# Patient Record
Sex: Female | Born: 1950 | Race: White | Hispanic: No | Smoking: Never smoker
Health system: Southern US, Community
[De-identification: ages and names within clinical notes are randomized; demographics above are authoritative.]

## PROBLEM LIST (undated history)

## (undated) DIAGNOSIS — R5383 Other fatigue: Secondary | ICD-10-CM

## (undated) DIAGNOSIS — E039 Hypothyroidism, unspecified: Secondary | ICD-10-CM

## (undated) DIAGNOSIS — M1990 Unspecified osteoarthritis, unspecified site: Secondary | ICD-10-CM

## (undated) DIAGNOSIS — R079 Chest pain, unspecified: Secondary | ICD-10-CM

## (undated) DIAGNOSIS — E079 Disorder of thyroid, unspecified: Secondary | ICD-10-CM

## (undated) DIAGNOSIS — M25511 Pain in right shoulder: Secondary | ICD-10-CM

## (undated) DIAGNOSIS — Z634 Disappearance and death of family member: Secondary | ICD-10-CM

## (undated) DIAGNOSIS — W57XXXA Bitten or stung by nonvenomous insect and other nonvenomous arthropods, initial encounter: Secondary | ICD-10-CM

## (undated) DIAGNOSIS — K219 Gastro-esophageal reflux disease without esophagitis: Secondary | ICD-10-CM

## (undated) DIAGNOSIS — R9389 Abnormal findings on diagnostic imaging of other specified body structures: Secondary | ICD-10-CM

## (undated) DIAGNOSIS — T63791A Toxic effect of contact with other venomous plant, accidental (unintentional), initial encounter: Secondary | ICD-10-CM

## (undated) DIAGNOSIS — Z8619 Personal history of other infectious and parasitic diseases: Secondary | ICD-10-CM

## (undated) HISTORY — DX: Unspecified osteoarthritis, unspecified site: M19.90

## (undated) HISTORY — DX: Disorder of thyroid, unspecified: E07.9

## (undated) HISTORY — DX: Other fatigue: R53.83

## (undated) HISTORY — DX: Personal history of other infectious and parasitic diseases: Z86.19

## (undated) HISTORY — PX: BACK SURGERY: SHX140

## (undated) HISTORY — DX: Disappearance and death of family member: Z63.4

## (undated) HISTORY — PX: THYROID SURGERY: SHX805

## (undated) HISTORY — DX: Toxic effect of contact with other venomous plant, accidental (unintentional), initial encounter: T63.791A

## (undated) HISTORY — DX: Chest pain, unspecified: R07.9

## (undated) HISTORY — DX: Bitten or stung by nonvenomous insect and other nonvenomous arthropods, initial encounter: W57.XXXA

## (undated) HISTORY — DX: Pain in right shoulder: M25.511

## (undated) HISTORY — DX: Gastro-esophageal reflux disease without esophagitis: K21.9

## (undated) HISTORY — DX: Abnormal findings on diagnostic imaging of other specified body structures: R93.89

## (undated) HISTORY — PX: NECK SURGERY: SHX720

---

## 1999-12-30 ENCOUNTER — Ambulatory Visit (HOSPITAL_COMMUNITY): Admission: RE | Admit: 1999-12-30 | Discharge: 1999-12-30 | Payer: Self-pay | Admitting: Neurosurgery

## 2000-02-26 ENCOUNTER — Inpatient Hospital Stay (HOSPITAL_COMMUNITY): Admission: RE | Admit: 2000-02-26 | Discharge: 2000-02-27 | Payer: Self-pay | Admitting: Neurosurgery

## 2000-03-19 ENCOUNTER — Encounter: Admission: RE | Admit: 2000-03-19 | Discharge: 2000-03-19 | Payer: Self-pay | Admitting: Neurosurgery

## 2000-06-11 ENCOUNTER — Encounter: Admission: RE | Admit: 2000-06-11 | Discharge: 2000-06-11 | Payer: Self-pay | Admitting: Neurosurgery

## 2000-06-20 ENCOUNTER — Ambulatory Visit (HOSPITAL_COMMUNITY): Admission: RE | Admit: 2000-06-20 | Discharge: 2000-06-20 | Payer: Self-pay | Admitting: Neurosurgery

## 2000-07-05 ENCOUNTER — Ambulatory Visit (HOSPITAL_COMMUNITY): Admission: RE | Admit: 2000-07-05 | Discharge: 2000-07-05 | Payer: Self-pay | Admitting: Neurosurgery

## 2000-09-14 ENCOUNTER — Encounter: Admission: RE | Admit: 2000-09-14 | Discharge: 2000-09-14 | Payer: Self-pay | Admitting: Neurosurgery

## 2000-09-28 ENCOUNTER — Ambulatory Visit (HOSPITAL_COMMUNITY): Admission: RE | Admit: 2000-09-28 | Discharge: 2000-09-28 | Payer: Self-pay | Admitting: Neurosurgery

## 2000-11-16 ENCOUNTER — Encounter: Admission: RE | Admit: 2000-11-16 | Discharge: 2000-11-16 | Payer: Self-pay | Admitting: Neurosurgery

## 2002-06-18 ENCOUNTER — Ambulatory Visit (HOSPITAL_COMMUNITY): Admission: RE | Admit: 2002-06-18 | Discharge: 2002-06-18 | Payer: Self-pay | Admitting: Neurosurgery

## 2004-01-18 ENCOUNTER — Ambulatory Visit (HOSPITAL_COMMUNITY): Admission: RE | Admit: 2004-01-18 | Discharge: 2004-01-18 | Payer: Self-pay | Admitting: Family Medicine

## 2004-01-21 LAB — HM DEXA SCAN

## 2004-08-05 ENCOUNTER — Inpatient Hospital Stay (HOSPITAL_COMMUNITY): Admission: EM | Admit: 2004-08-05 | Discharge: 2004-08-08 | Payer: Self-pay | Admitting: Emergency Medicine

## 2004-08-06 ENCOUNTER — Encounter: Payer: Self-pay | Admitting: Cardiovascular Disease

## 2006-02-02 ENCOUNTER — Ambulatory Visit (HOSPITAL_COMMUNITY): Admission: RE | Admit: 2006-02-02 | Discharge: 2006-02-02 | Payer: Self-pay | Admitting: Family Medicine

## 2006-08-07 ENCOUNTER — Inpatient Hospital Stay (HOSPITAL_COMMUNITY): Admission: EM | Admit: 2006-08-07 | Discharge: 2006-08-10 | Payer: Self-pay | Admitting: Emergency Medicine

## 2006-09-08 ENCOUNTER — Ambulatory Visit (HOSPITAL_COMMUNITY): Admission: RE | Admit: 2006-09-08 | Discharge: 2006-09-08 | Payer: Self-pay | Admitting: Family Medicine

## 2009-06-03 ENCOUNTER — Ambulatory Visit (HOSPITAL_COMMUNITY): Admission: RE | Admit: 2009-06-03 | Discharge: 2009-06-03 | Payer: Self-pay | Admitting: Family Medicine

## 2009-10-15 ENCOUNTER — Ambulatory Visit (HOSPITAL_COMMUNITY): Admission: RE | Admit: 2009-10-15 | Discharge: 2009-10-15 | Payer: Self-pay | Admitting: Family Medicine

## 2009-12-14 ENCOUNTER — Emergency Department (HOSPITAL_COMMUNITY): Admission: EM | Admit: 2009-12-14 | Discharge: 2009-12-14 | Payer: Self-pay | Admitting: Emergency Medicine

## 2011-05-08 NOTE — Discharge Summary (Signed)
NAME:  Tami Estrada, Tami Estrada                           ACCOUNT NO.:  [id number]   MEDICAL RECORD NO.:  [id number]                   PATIENT TYPE:  INP   LOCATION:  3703                                 FACILITY:  [facility]   PHYSICIAN:  Nanetta Batty, M.D.                DATE OF BIRTH:  1951/11/20   DATE OF ADMISSION:  08/06/2004  DATE OF DISCHARGE:  08/08/2004                                 DISCHARGE SUMMARY   DISCHARGE DIAGNOSES:  1. Chest pain consistent with unstable angina, catheterization, this     admission, revealing normal coronaries and normal left ventricular     function.  2. Treated hypothyroidism, TSH was low, this admission, and her Synthroid     will be held.   HOSPITAL COURSE:  The patient is a 60 year old female followed by Dr. Patrica Duel, who is a remote smoker.  She presented with substernal chest  pressure consistent with unstable angina.  She did have associated shortness  of breath and mild diaphoresis.  Her chest pain then became constant.  She  was seen at [facility] ER.  Enzymes were negative.  She was transferred to  [facility] for further evaluation.  Her EKG at [facility] was slightly abnormal  with some ST depression in the inferior leads and some poor anterior R wave  progression.  Patient was put on heparin, nitrates, aspirin.  Her TSH was  noted to be low at 0.02 and her Synthroid was held.  CT scan of her chest  showed no evidence of pulmonary embolism.  Catheterization was done, August 07, 2004, which revealed normal coronaries and normal LV function.  She  tolerated this well.  She was seen by Dr. Griffith Citron for GI  evaluation.  He felt her symptoms were more chest wall pain and not GI; he  suggested a PPI for a month and nonsteroidal.  No further GI workup was  recommended.  We feel she can be discharged, August 08, 2004.  She will  follow up with Dr. Patrica Duel in [place].   LABORATORY DATA:  A followup TSH was done prior to discharge which  confirmed  a low TSH of 0.016 with a T4 of 10.1 and T3 of 139.6.  Other labs at  discharge:  White count 6.3, hemoglobin 13.2, hematocrit 38.2, platelets  185,000.  Sodium 138, potassium 3.6, BUN 9, creatinine 1.0.  Liver functions  are normal.  CK-MBs and troponin are negative.  Lipid profile showed a  cholesterol of 139, HDL 50, LDL 70.   DISCHARGE MEDICATIONS:  1. Synthroid will be held.  2. She will go home on Voltaren 50 mg b.i.d. for 1 week to 2 weeks.  3. Protonix 40 mg a day for 1 month.      Abelino Derrick, P.A.  Nanetta Batty, M.D.    Lenard Lance  D:  08/08/2004  T:  08/09/2004  Job:  [id number]   cc:   Patrica Duel, M.D.  [address]  [place]  [place] [address]  Fax: [phone]

## 2011-05-08 NOTE — Cardiovascular Report (Signed)
NAME:  Tami Estrada, Tami Estrada                           ACCOUNT NO.:  [id number]   MEDICAL RECORD NO.:  [id number]                   PATIENT TYPE:  INP   LOCATION:  3703                                 FACILITY:  [facility]   PHYSICIAN:  Cristy Hilts. Jacinto Halim, M.D.                  DATE OF BIRTH:  [birth year]   DATE OF PROCEDURE:  08/07/2004  DATE OF DISCHARGE:  08/08/2004                              CARDIAC CATHETERIZATION   PROCEDURE PERFORMED:  1. Left ventriculography.  2. Selective right and left coronary arteriography.   INDICATION:  [name] is a 60 year old Caucasian female with no  significant prior cardiac history who was admitted to the hospital with  chest pain.  She has a very strong family history of premature coronary  artery disease.  Given this, she was ruled out for myocardial infarction and  she was brought to the cardiac catheterization lab after discussing the  risks and benefits of cardiac catheterization versus alternatives that  include cardiac stress testing.  Per patient permission, she was brought to  the cardiac catheterization lab to evaluate her coronary anatomy for  definitive diagnosis of coronary artery disease.   HEMODYNAMIC DATA:  1. The left ventricular pressure was 126/1 with end-diastolic pressure of 7     mmHg.  2. Aortic pressure 139/64 with a mean of 94 mmHg.  3. There was no pressure gradient across the aortic valve.   ANGIOGRAPHIC DATA:  Left ventricle:  Left ventricular systolic function was  normal and ejection fraction was estimated at 60%.  There is no significant  mitral regurgitation.   Right coronary artery:  The right coronary artery has a superior takeoff.  It was cannulated by means of a 6 [place] Noto catheter.  The RCA is normal.   Left main coronary artery:  Left main coronary artery is a long segment  vessel and a large vessel which is normal.   Circumflex:  Circumflex is a large caliber vessel.  Continues in the AV  groove as two  moderate branches and gives origin to large obtuse marginal  one.  The circumflex is normal.   Left anterior descending artery:  Left anterior descending artery is a large  caliber vessel.  It gives origin to a large septal perforator and a large  diagonal one which is normal.   IMPRESSION:  1. Normal left ventricular systolic function, ejection fraction 60%.  2. Normal coronary arteries.   RECOMMENDATIONS:  Evaluation for noncardiac cause of chest pain is  indicated.   TECHNIQUE OF PROCEDURE:  Under usual sterile precautions, using a 6 French  right femoral arterial access, a 6 [place] multipurpose B2 catheter was  advanced into the ascending aorta over a 0.035-inch J wire.  The catheter  was then gently advanced to the left ventricle and left ventricular  pressures were monitored.  Hand contrast injection of the left ventricle was  performed  both in the LAO and RAO projection.  The catheter was pulled back  in the ascending aorta and pressure gradient across the aortic valve was  monitored. Ascending aortogram was performed because of inability to  cannulate the right coronary artery.  The superior takeoff of the right  coronary artery was then visualized.  Left main coronary selective engaged  with the help of the multipurpose B2 catheter and angiography was repeated.  Then, the catheter was pulled out of the body and initially an AL3 catheter  was utilized and then a Noto catheter was utilized to engage the right  coronary artery and angiography was repeated.  Then, the catheter was pulled  out of the body in the usual fashion.  The patient tolerated the procedure  well.  No immediate complications noted.   ASCENDING AORTOGRAM:  Ascending aortogram revealed presence of three aortic  valve cusps.  There was no evidence of aortic regurgitation.  The superior  origin of the right coronary artery was confirmed from the ascending  aortogram.                                                Cristy Hilts. Jacinto Halim, M.D.    Pilar Plate  D:  08/07/2004  T:  08/08/2004  Job:  [id number]

## 2011-05-08 NOTE — H&P (Signed)
NAMESHELBEE, APGAR                 ACCOUNT NO.:  [id number]   MEDICAL RECORD NO.:  [id number]          PATIENT TYPE:  INP   LOCATION:  A303                          FACILITY:  [facility]   PHYSICIAN:  Kirk Ruths, M.D.DATE OF BIRTH:  [birth year]   DATE OF ADMISSION:  08/07/2006  DATE OF DISCHARGE:  LH                                HISTORY & PHYSICAL   CHIEF COMPLAINT:  Fever, nausea, and headache.   HISTORY OF PRESENT ILLNESS:  This is a 60 year old female who began having  chills the day before admission. The patient also stated she felt a headache  and had an episode of vomiting the night before. The patient felt better the  next morning but chills, vomiting, and headache recurred. The patient  presented to the emergency room where she was found to have a normal white  count. She had multiple cells in her urine. She was febrile, although I do  not have that temperature available at this moment. The patient denies neck  pain or soreness. She does state she has had some recent urinary tract  infection and some mild dysuria. She is admitted for treatment of her  urinary tract infection and treatment of her other symptoms with further  workup.   ALLERGIES:  She cannot tolerate HYDROCODONE due to her nausea and vomiting.   PAST MEDICAL HISTORY:  She has hypothyroidism and takes Synthroid 100 mcg  daily.   REVIEW OF SYSTEMS:  Denies diarrhea. Denies cough or congestion. Does state  that she had a tic bite approximately 1 and 1/2 months ago.   SOCIAL HISTORY:  Non-smoker, non-drinker.   PHYSICAL EXAMINATION:  GENERAL:  A middle age female who appears miserable.  VITAL SIGNS:  Temperature 99.9, pulse 100 and regular. Respiratory rate 20.  Blood pressure 130/65.  HEENT:  Tympanic membranes normal. Pupils are equal, round, and reactive to  light and accommodation. Oropharynx benign.  NECK:  Supple without soreness. No JVD, bruit, or thyromegaly.  LUNGS:  Clear.  HEART:  Regular  sinus rhythm without murmur, rub, or gallop.  ABDOMEN:  Soft with mild suprapubic tenderness.  EXTREMITIES:  Without clubbing, cyanosis, or edema.  NEUROLOGIC:  Examination is grossly intact.   ASSESSMENT:  1. Urinary tract infection with fever, vomiting, and headache.  2. Hypothyroidism.      Kirk Ruths, M.D.  Electronically Signed     WMM/MEDQ  D:  08/08/2006  T:  08/08/2006  Job:  [id number]

## 2011-05-08 NOTE — Consult Note (Signed)
NAME:  Tami Estrada, Tami Estrada                           ACCOUNT NO.:  [id number]   MEDICAL RECORD NO.:  [id number]                   PATIENT TYPE:  INP   LOCATION:  3703                                 FACILITY:  [facility]   PHYSICIAN:  Griffith Citron, M.D.             DATE OF BIRTH:  1951-06-12   DATE OF CONSULTATION:  08/07/2004  DATE OF DISCHARGE:                                   CONSULTATION   REASON FOR CONSULTATION:  Noncardiac chest pain.   HISTORY OF PRESENT ILLNESS:  The patient is a 60 year old white female who  presented to [facility] with complaints of chest pain that began  four nights ago.  It has been constant in nature over the past four days.  The pain is centered in the substernal xiphoid region, radiates underneath  the left breast.  The pain is sharp in nature and is exacerbated with deep  breathing or with change of position.  She has had an associated cough  without shortness of breath.  No hemoptysis.  The patient underwent cardiac  catheterization to rule out coronary artery disease, especially with a  family history of coronary artery disease.  That examination, performed  earlier today, was normal.   The patient offers no GI symptoms.  She has no odynophagia, dysphagia,  pyrosis, water brash, or dysgeusia.  She denies abdominal pain, bloating,  nausea, vomiting, or change in appetite which remains excellent.  She did  have one episode of nausea which occurred with extreme exacerbation of the  chest pain but this was transient.  Bowel habits have remained regular,  although a bit firmer.  There is no hematochezia, change in stool caliber,  straining, or other related symptom.   The patient does not have any constitutional symptoms.  Her appetite remains  excellent.  Weight has been stable.  No fever, rigor, chill, or diaphoresis.   PAST MEDICAL HISTORY:  Hypothyroidism.   CURRENT MEDICATIONS:  1. Synthroid daily.  2. Ibuprofen p.r.n. averaging perhaps  one per week.   ALLERGIES:  No known drug allergies.   SOCIAL HISTORY:  Patient is married with three children, two great  grandchildren.  The patient is one of 11 siblings.  One sister died.  Two  boys, nine girls in the original brood.  Heart disease predominates  throughout the family.  Father with history of peptic ulcer disease.  Two  sisters with GERD.   REVIEW OF SYSTEMS:  Noncontributory.   PHYSICAL EXAMINATION:  GENERAL:  Healthy-appearing middle aged white female  appearing a bit older than stated age.  Alert, oriented.  Full affect.  Normal mood.  VITAL SIGNS:  Stable.  HEENT:  Anicteric sclerae.  Pink conjunctivae.  EOMI.  PERRL.  No  oropharyngeal lesion without lesion of lip, tongue, or gums.  NECK:  Supple.  No adenopathy, thyromegaly.  Soft bilateral carotid bruit.  No JVD.  CHEST:  Clear  to auscultation.  No friction rub appreciated.  No  adventitious sounds.  CARDIAC:  Regular rhythm.  No gallop.  No murmur.  ABDOMEN:  Benign, soft, nontender, nondistended.  No palpable organomegaly  or mass.  Bowel sounds are active throughout.  No borborygmi, bruit, or  splash.  RECTAL:  Not performed.  EXTREMITIES:  No clubbing, cyanosis, or edema.  Right groin catheterization  site without evidence of bleeding.  NEUROLOGIC:  No focal deficit.   LABORATORIES:  Normal.   ASSESSMENT:  Chest pain, noncardiac based on normal angiogram.  No symptoms  to suggest a gastrointestinal etiology.  There is no evidence for reflux,  dyspepsia, or lower gastrointestinal symptoms.  The constant nature of the  patient's symptoms position nature and exacerbation with deep breathing and  also the characteristic of sharp, knife like quality all suggest a  serositis.  Pulmonary embolus, viral infection, vasculitis all need to be  considered.  I think endoscopy will be normal.  I am willing to perform this  if gastrointestinal illness continues to be considered a possibility;  however, I think  that evaluation with chest CT and work-up for possible  vasculitis would be far more fruitful.  In the interim I would simply treat  with antireflux measures.   RECOMMENDATIONS:  1. Chest CT.  2. Evaluate possible serositis.  3. Reserve further GI evaluation for evolving symptoms and/or findings.                                               Griffith Citron, M.D.    JRM/MEDQ  D:  08/07/2004  T:  08/08/2004  Job:  [id number]   cc:   Patrica Duel, M.D.  [address], [address]  [place]  [place] [address]  Fax: [phone]
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
## 2011-05-08 NOTE — H&P (Signed)
NAME:  Tami Estrada, Tami Estrada                           ACCOUNT NO.:  [id number]   MEDICAL RECORD NO.:  [id number]                   PATIENT TYPE:  INP   LOCATION:  A202                                 FACILITY:  [facility]   PHYSICIAN:  Patrica Duel, M.D.                 DATE OF BIRTH:  12-17-1951   DATE OF ADMISSION:  08/05/2004  DATE OF DISCHARGE:                                HISTORY & PHYSICAL   COMBINED HISTORY AND PHYSICAL AND DISCHARGE SUMMARY   CHIEF COMPLAINT:  Chest pain.   HISTORY OF PRESENT ILLNESS:  This is a 60 year old female with a history of  hypothyroidism which has been compensated.  She also has undergone a  laminectomy of the cervical spine by Dr. Lovell Sheehan in the remote past.   The patient presented to the office in the late afternoon with a 24-36 hour  history of worsening exertional substernal chest pain with radiation to the  right lateral chest.  There was some pleuritic as well as positional  component noted.  Upon further questioning, the patient relates exertional  chest pain intermittently over the past year.  Of significance, her family  history is strongly positive for coronary artery disease.  She has three  siblings that have undergone bypass grafting.  She has one brother with  valvular disease.   The patient is a nonsmoker.  Her lipid status is undetermined at this time.   An EKG obtained in the office revealed subtle changes of possible  inferolateral ischemia.  She was sent to the emergency department for  further evaluation and therapy.   Workup in the emergency department is essentially benign except for noted  EKG changes.  D-dimer was normal and laboratory unremarkable.   The patient was admitted for rule-out myocardial infarction protocol.  Further evaluation and therapy is  indicated.   PAST MEDICAL HISTORY:  As noted above.   MEDICATIONS ON ADMISSION:  Synthroid 100 mcg daily.   ALLERGIES:  None known.   PAST MEDICAL HISTORY:  As noted  above.   FAMILY HISTORY:  As noted above.   REVIEW OF SYSTEMS:  Negative except as mentioned.   SOCIAL HISTORY:  Nonsmoker, nondrinker.  A very supportive family.   PHYSICAL EXAMINATION:  GENERAL:  A very-pleasant female who is alert and  oriented and in no acute distress.  VITAL SIGNS:  Blood pressure 144/77, pulse 79, respirations 18.  She is  afebrile.  HEENT:  Normocephalic, atraumatic.  Pupils are equal.  Ears, nose and throat  are benign.  NECK:  Supple.  There are no bruits audible.  No thyromegaly or masses.  LUNGS:  Clear.  CHEST:  Pectus excavatum is present.  HEART:  Sounds are completely normal.  No murmurs, rubs or gallops.  ABDOMEN:  Nontender, nondistended.  Bowel sounds are intact.  EXTREMITIES:  No clubbing, cyanosis or edema.  NEUROLOGIC:  Exam is within normal limits.  Neurologic exam is nonfocal.   LABORATORY DATA:  Normal EKG as noted.  D-dimer is normal.  BNP is also  normal.   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  A very strong family history of coronary disease well documented.  EKG suggestive of ischemia.   PLAN:  The patient was admitted for rule out myocardial infarction protocol,  etc.   HOSPITAL COURSE:  The patient was empirically treated with heparin IV and  started on Imdur 30 mg daily.  She was also given metoprolol 25 mg b.i.d.  Overnight, the patient has had two brief episodes of chest pain.  An EKG  obtained this morning shows normalization of ST-T segments that were  previously somewhat depressed.  Currently she is stable without chest pain.  Rhythm is normal.   DISCHARGE PLANNING:  The patient is to be transferred to [facility] as soon  as a bed is available under the service of [facility].  They  are to call when an opening comes.  We will continue current therapy at this  time.     ___________________________________________                                         Patrica Duel, M.D.   MC/MEDQ  D:  08/06/2004  T:  08/06/2004  Job:  [id number]
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
## 2011-05-08 NOTE — Discharge Summary (Signed)
NAMETELETHA, Tami Estrada                 ACCOUNT NO.:  [id number]   MEDICAL RECORD NO.:  [id number]          PATIENT TYPE:  INP   LOCATION:  A303                          FACILITY:  [facility]   PHYSICIAN:  Kirk Ruths, M.D.DATE OF BIRTH:  [birth year]   DATE OF ADMISSION:  08/07/2006  DATE OF DISCHARGE:  08/21/2007LH                                 DISCHARGE SUMMARY   DISCHARGE DIAGNOSES:  1. Urinary tract infection.  2. Headache.  3. Vomiting.  4. Hypothyroidism.   HOSPITAL COURSE:  This is a 60 year old female who is in good health with  the exception of hypothyroidism controlled with Synthroid.  The patient two  days before admission began having a fever with headaches and vomiting.  In  the emergency room, she was found to have a normal CT scan of the brain.  White count was 5500, hemoglobin 13.9.  Potassium was 3.2 which improved to  4.4 two days later.  The patient's LFTs were normal.  Lipase was normal.  Her TSH was 0.018.  Urinalysis showed 11-20 white cells.  The patient was  admitted to the floor.  Headache and nausea were treated.  A culture was  obtained of her urine but had no growth at the time of discharge.  The  patient's chills and fever resolved as well as her headache.  A titer for  RMSF was sent out but is not back at the time of discharge.  The patient had  a remote history of tick bite approximately a month before.  She is feeling  fine, tolerating a regular diet, no headache, no fever.   She will be discharged home on Cipro and doxycycline.   She will be followed in the office in 1 week if needed.      Kirk Ruths, M.D.  Electronically Signed     WMM/MEDQ  D:  08/10/2006  T:  08/10/2006  Job:  [id number]

## 2011-05-18 ENCOUNTER — Emergency Department (HOSPITAL_COMMUNITY)
Admission: EM | Admit: 2011-05-18 | Discharge: 2011-05-18 | Disposition: A | Discharge status: Home / Self Care | Payer: Medicare Other | Attending: Emergency Medicine | Admitting: Emergency Medicine

## 2011-05-18 ENCOUNTER — Emergency Department (HOSPITAL_COMMUNITY): Payer: Medicare Other

## 2011-05-18 DIAGNOSIS — E039 Hypothyroidism, unspecified: Secondary | ICD-10-CM | POA: Insufficient documentation

## 2011-05-18 DIAGNOSIS — R059 Cough, unspecified: Secondary | ICD-10-CM | POA: Insufficient documentation

## 2011-05-18 DIAGNOSIS — R05 Cough: Secondary | ICD-10-CM | POA: Insufficient documentation

## 2011-05-18 DIAGNOSIS — J40 Bronchitis, not specified as acute or chronic: Secondary | ICD-10-CM | POA: Insufficient documentation

## 2011-05-18 DIAGNOSIS — R0602 Shortness of breath: Secondary | ICD-10-CM | POA: Insufficient documentation

## 2011-05-18 DIAGNOSIS — R51 Headache: Secondary | ICD-10-CM | POA: Insufficient documentation

## 2011-05-18 DIAGNOSIS — J029 Acute pharyngitis, unspecified: Secondary | ICD-10-CM | POA: Insufficient documentation

## 2012-03-11 ENCOUNTER — Ambulatory Visit (HOSPITAL_COMMUNITY)
Admission: RE | Admit: 2012-03-11 | Discharge: 2012-03-11 | Disposition: A | Discharge status: Home / Self Care | Payer: Medicare Other | Source: Ambulatory Visit | Attending: Family Medicine | Admitting: Family Medicine

## 2012-03-11 ENCOUNTER — Ambulatory Visit (INDEPENDENT_AMBULATORY_CARE_PROVIDER_SITE_OTHER): Payer: Medicare Other | Admitting: Family Medicine

## 2012-03-11 ENCOUNTER — Other Ambulatory Visit: Payer: Self-pay | Admitting: Family Medicine

## 2012-03-11 ENCOUNTER — Encounter: Payer: Self-pay | Admitting: Family Medicine

## 2012-03-11 VITALS — BP 134/80 | HR 70 | Resp 15 | Ht 62.0 in | Wt 129.1 lb

## 2012-03-11 DIAGNOSIS — Z780 Asymptomatic menopausal state: Secondary | ICD-10-CM

## 2012-03-11 DIAGNOSIS — M25562 Pain in left knee: Secondary | ICD-10-CM

## 2012-03-11 DIAGNOSIS — M25569 Pain in unspecified knee: Secondary | ICD-10-CM

## 2012-03-11 DIAGNOSIS — E039 Hypothyroidism, unspecified: Secondary | ICD-10-CM

## 2012-03-11 DIAGNOSIS — Z139 Encounter for screening, unspecified: Secondary | ICD-10-CM

## 2012-03-11 LAB — COMPREHENSIVE METABOLIC PANEL
Alkaline Phosphatase: 80 U/L (ref 39–117)
BUN: 13 mg/dL (ref 6–23)
CO2: 27 mEq/L (ref 19–32)
Calcium: 9.6 mg/dL (ref 8.4–10.5)
Chloride: 104 mEq/L (ref 96–112)
Potassium: 4.5 mEq/L (ref 3.5–5.3)
Total Bilirubin: 0.7 mg/dL (ref 0.3–1.2)
Total Protein: 7 g/dL (ref 6.0–8.3)

## 2012-03-11 MED ORDER — NAPROXEN 500 MG PO TABS: 500.0000 mg | ORAL_TABLET | Freq: Two times a day (BID) | ORAL | Status: AC

## 2012-03-11 NOTE — Patient Instructions (Signed)
Please set up for Mammogram Get the knee xrays done at [facility]  I will get records from Dr. Malvin Johns and Robbie Lis Get the labs done fasting  Pick up the anti-inflammatories I will call with lab results Please schedule a physical exam for PAP Smear

## 2012-03-12 LAB — CBC
MCHC: 32.9 g/dL (ref 30.0–36.0)
Platelets: 197 10*3/uL (ref 150–400)

## 2012-03-12 LAB — T3, FREE: T3, Free: 4 pg/mL (ref 2.3–4.2)

## 2012-03-13 ENCOUNTER — Encounter: Payer: Self-pay | Admitting: Family Medicine

## 2012-03-13 MED ORDER — LEVOTHYROXINE SODIUM 88 MCG PO TABS: 88.0000 ug | ORAL_TABLET | Freq: Every day | ORAL | Status: DC

## 2012-03-13 NOTE — Progress Notes (Signed)
  Subjective:    Patient ID: Tami Estrada, female    DOB: 07-11-1951, 61 y.o.   MRN: [id number]  HPI  Pt here to establish care, previous PCP Dr. Jordan Likes [facility]) Back surgeon Dr. Jacqulyn Bath Dr. Cora Collum surgeon Dr.Bradford- thyroid surgery  Hypothyroidsm- she is on replacement but has not had labs done in a few years, she does not like to go to the doctor and her surgeon continues to fill her meds. She states she had a partial thyroidectomy. She denies changes in cold or heat, brittle nails, weight problems  Left knee pain- injured her knee many ears ago, past few weeks while working out at the gym she has had knee pain, +swelling but that has gone down now, has a popping sound and it catches at times.  +PNA vaccine 2010 +flu shot  No shingles vaccine No pap smear many years Due for Mammogram No colonoscopy    Review of Systems   GEN- denies fatigue, fever, weight loss,weakness, recent illness HEENT- denies eye drainage, change in vision, nasal discharge, CVS- denies chest pain, palpitations RESP- denies SOB, cough, wheeze ABD- denies N/V, change in stools, abd pain GU- denies dysuria, hematuria, dribbling, incontinence MSK- + joint pain, muscle aches, injury Neuro- denies headache, dizziness, syncope, seizure activity      Objective:   Physical Exam GEN- NAD, alert and oriented x3 HEENT- PERRL, EOMI, non injected sclera, pink conjunctiva, MMM, oropharynx clear Neck- Supple, ? Palpable thyroid tissue off to right side, plate on left side of neck CVS- RRR, no murmur RESP-CTAB ABD-NABS,soft, NT,ND EXT- No edema Pulses- Radial, DP- 2+ Knee- small amount of swelling note above patella, no large effusion, ligaments in tact, TTP inferior joint line, minimal pain with ROM. Right knee normal ROM no effusion       Assessment & Plan:

## 2012-03-13 NOTE — Assessment & Plan Note (Signed)
Naprosyn, ICE and elevate. Obtain x-ray with sunrise view, for potential arthritis, ligaments in tact, if no improvement will refer to ortho

## 2012-03-13 NOTE — Assessment & Plan Note (Signed)
Due for Mammogram and PAP Smear Check Vit D levels

## 2012-03-13 NOTE — Assessment & Plan Note (Addendum)
Check thyroid studies, obtain labs , obtain records from her surgeon, the plate in her neck may be pushing the thyroid tissue off and this is what I may be feeling  Decrease synthroid to 

## 2012-03-14 ENCOUNTER — Ambulatory Visit (HOSPITAL_COMMUNITY)
Admission: RE | Admit: 2012-03-14 | Discharge: 2012-03-14 | Disposition: A | Discharge status: Home / Self Care | Payer: Medicare Other | Source: Ambulatory Visit | Attending: Family Medicine | Admitting: Family Medicine

## 2012-03-14 DIAGNOSIS — Z1231 Encounter for screening mammogram for malignant neoplasm of breast: Secondary | ICD-10-CM | POA: Insufficient documentation

## 2012-03-14 DIAGNOSIS — Z139 Encounter for screening, unspecified: Secondary | ICD-10-CM

## 2012-03-22 ENCOUNTER — Encounter: Payer: Self-pay | Admitting: Family Medicine

## 2012-03-29 ENCOUNTER — Other Ambulatory Visit (HOSPITAL_COMMUNITY)
Admission: RE | Admit: 2012-03-29 | Discharge: 2012-03-29 | Disposition: A | Discharge status: Home / Self Care | Payer: Medicare Other | Source: Ambulatory Visit | Attending: Family Medicine | Admitting: Family Medicine

## 2012-03-29 ENCOUNTER — Ambulatory Visit (INDEPENDENT_AMBULATORY_CARE_PROVIDER_SITE_OTHER): Payer: Medicare Other | Admitting: Family Medicine

## 2012-03-29 ENCOUNTER — Encounter: Payer: Self-pay | Admitting: Family Medicine

## 2012-03-29 VITALS — BP 130/72 | HR 62 | Resp 18 | Ht 62.0 in | Wt 128.0 lb

## 2012-03-29 DIAGNOSIS — Z13220 Encounter for screening for lipoid disorders: Secondary | ICD-10-CM

## 2012-03-29 DIAGNOSIS — Z01419 Encounter for gynecological examination (general) (routine) without abnormal findings: Secondary | ICD-10-CM | POA: Insufficient documentation

## 2012-03-29 DIAGNOSIS — E039 Hypothyroidism, unspecified: Secondary | ICD-10-CM

## 2012-03-29 LAB — HM PAP SMEAR: HM Pap smear: NORMAL

## 2012-03-29 NOTE — Assessment & Plan Note (Signed)
Pt to start new dose, recheck in 6 weeks

## 2012-03-29 NOTE — Patient Instructions (Signed)
Start the new thyroid medication Please get your blood down in 6 weeks after starting the medication to recheck your levels - make sure you are fasting at that time I recommend Calcium 1200mg  daily and Vit D 800IU daily  Continue your exercise program  F/U in 1 year for physical exam or as needed

## 2012-03-29 NOTE — Progress Notes (Signed)
  Subjective:    Patient ID: Tami Estrada, female    DOB: [birth year], 61 y.o.   MRN: [id number]  HPI Pt here for GYN exam no concerns Left Knee pain- resolved Mammogram done last week-normal Last PAP Smear > 15 years ago, no vaginal spotting or discharge  Labs reviewed Needs FLP  Synthroid- has not started decreased dose of medication Pt declines TDAP  Review of Systems  GEN- denies fatigue, fever, weight loss,weakness, recent illness HEENT- denies eye drainage, change in vision, nasal discharge, CVS- denies chest pain, palpitations RESP- denies SOB, cough, wheeze ABD- denies N/V, change in stools, abd pain GU- denies dysuria, hematuria, dribbling, incontinence MSK- denies joint pain, muscle aches, injury Neuro- denies headache, dizziness, syncope, seizure activity      Objective:   Physical Exam GEN- NAD, alert and oriented x 3  GU- normal external genitalia, vaginal mucosa pink and moist, cervix visualized no growth, no blood form os, no discharge, no CMT, no ovarian masses, uterus normal size Rectal- normal tone, soft brown stool, FOBT neg Ext- no edema Knee- Left knee good ROM, no effusion, mild crepitus, non tender to palpation         Assessment & Plan:   GYN Exam- Mammogram normal, PAP Smear done  Discussed calcium and Vit D supplement Continue exercise Declined colonoscopy, TDAP

## 2012-04-18 LAB — LIPID PANEL
Cholesterol: 229 mg/dL — ABNORMAL HIGH (ref 0–200)
HDL: 59 mg/dL (ref >39)
LDL Cholesterol: 136 mg/dL — ABNORMAL HIGH (ref 0–99)
Total CHOL/HDL Ratio: 3.9 Ratio
Triglycerides: 168 mg/dL — ABNORMAL HIGH (ref <150)
VLDL: 34 mg/dL (ref 0–40)

## 2012-04-18 LAB — T3, FREE: T3, Free: 2.9 pg/mL (ref 2.3–4.2)

## 2012-04-18 LAB — TSH: TSH: 0.099 u[IU]/mL — ABNORMAL LOW (ref 0.350–4.500)

## 2012-04-19 ENCOUNTER — Telehealth: Payer: Self-pay | Admitting: Family Medicine

## 2012-04-19 DIAGNOSIS — E039 Hypothyroidism, unspecified: Secondary | ICD-10-CM

## 2012-04-19 MED ORDER — LEVOTHYROXINE SODIUM 88 MCG PO TABS: 88.0000 ug | ORAL_TABLET | Freq: Every day | ORAL | Status: DC

## 2012-04-19 NOTE — Telephone Encounter (Signed)
Pt given lab results, she had her thyroid studies too early after dose change, though it was improving, will keep her at and reheck in 6 weeks Discussed cholesterol , pt to work on diet, will also start fish oil and try to get up to  twice a day

## 2012-06-16 LAB — TSH: TSH: 0.013 u[IU]/mL — ABNORMAL LOW (ref 0.350–4.500)

## 2012-06-17 ENCOUNTER — Telehealth: Payer: Self-pay | Admitting: Family Medicine

## 2012-06-17 DIAGNOSIS — E039 Hypothyroidism, unspecified: Secondary | ICD-10-CM

## 2012-06-17 MED ORDER — LEVOTHYROXINE SODIUM 75 MCG PO TABS: ORAL_TABLET | ORAL | Status: DC

## 2012-06-17 NOTE — Telephone Encounter (Signed)
New dose of synthroid sent- now , looks like she is getting too much medication. Plan to recheck this time in 2  months to allow for adjustment Please give pt above message and let her know if the repeat testing is still abnormal I will send her to a thyroid specialist/endocrinology Labs placed please fax over

## 2012-06-17 NOTE — Telephone Encounter (Signed)
Patient aware.

## 2012-06-17 NOTE — Telephone Encounter (Signed)
Called patient and left message for them to return call at the office   

## 2012-06-17 NOTE — Telephone Encounter (Signed)
Message copied by Salley Scarlet on Fri Jun 17, 2012 11:31 AM ------      Message from: Abner Greenspan      Created: Fri Jun 17, 2012 10:58 AM       Has been taking it since it was prescribed

## 2012-09-13 ENCOUNTER — Other Ambulatory Visit: Payer: Self-pay | Admitting: Family Medicine

## 2012-09-13 ENCOUNTER — Ambulatory Visit (INDEPENDENT_AMBULATORY_CARE_PROVIDER_SITE_OTHER): Payer: Medicare Other

## 2012-09-13 DIAGNOSIS — Z23 Encounter for immunization: Secondary | ICD-10-CM

## 2012-09-13 LAB — T3, FREE: T3, Free: 2.9 pg/mL (ref 2.3–4.2)

## 2012-09-13 LAB — T4: T4, Total: 10.5 ug/dL (ref 5.0–12.5)

## 2012-09-13 LAB — TSH: TSH: 0.033 u[IU]/mL — ABNORMAL LOW (ref 0.350–4.500)

## 2012-09-15 ENCOUNTER — Telehealth: Payer: Self-pay | Admitting: Family Medicine

## 2012-09-16 ENCOUNTER — Telehealth: Payer: Self-pay | Admitting: Family Medicine

## 2012-09-16 DIAGNOSIS — E039 Hypothyroidism, unspecified: Secondary | ICD-10-CM

## 2012-09-16 MED ORDER — LEVOTHYROXINE SODIUM 75 MCG PO TABS
75.0000 ug | ORAL_TABLET | Freq: Every day | ORAL | Status: DC
Start: 2012-09-16 — End: 2012-12-20

## 2012-09-16 NOTE — Telephone Encounter (Signed)
Patient aware.

## 2012-09-16 NOTE — Telephone Encounter (Signed)
Will keep current dose, pt levels still abnormal, she has had remote thyroid surgery, will send to endocrinology for input

## 2012-09-16 NOTE — Telephone Encounter (Signed)
Pt aware.

## 2012-12-09 ENCOUNTER — Other Ambulatory Visit: Payer: Self-pay | Admitting: Family Medicine

## 2012-12-20 ENCOUNTER — Encounter: Payer: Self-pay | Admitting: Family Medicine

## 2012-12-20 ENCOUNTER — Ambulatory Visit (INDEPENDENT_AMBULATORY_CARE_PROVIDER_SITE_OTHER): Payer: Medicare Other | Admitting: Family Medicine

## 2012-12-20 VITALS — BP 128/68 | HR 56 | Resp 18 | Ht 62.0 in | Wt 128.1 lb

## 2012-12-20 DIAGNOSIS — R300 Dysuria: Secondary | ICD-10-CM

## 2012-12-20 DIAGNOSIS — J00 Acute nasopharyngitis [common cold]: Secondary | ICD-10-CM

## 2012-12-20 DIAGNOSIS — N3000 Acute cystitis without hematuria: Secondary | ICD-10-CM

## 2012-12-20 DIAGNOSIS — E039 Hypothyroidism, unspecified: Secondary | ICD-10-CM

## 2012-12-20 DIAGNOSIS — M549 Dorsalgia, unspecified: Secondary | ICD-10-CM

## 2012-12-20 LAB — POCT URINALYSIS DIPSTICK
Blood, UA: NEGATIVE
Nitrite, UA: NEGATIVE
Protein, UA: NEGATIVE
Urobilinogen, UA: 0.2
pH, UA: 7.5

## 2012-12-20 MED ORDER — CEFTRIAXONE SODIUM 1 G IJ SOLR
500.0000 mg | Freq: Once | INTRAMUSCULAR | Status: AC
  Administered 2012-12-20: 500 mg via INTRAMUSCULAR

## 2012-12-20 NOTE — Assessment & Plan Note (Signed)
Exam unremarkable, she has old scar from previous back surgery, no red flags, ibuprofen as needed

## 2012-12-20 NOTE — Patient Instructions (Addendum)
We will call if more medication needed You can take the ibuprofen Over the counter cough medicines for cold symptoms  F/U 6 months

## 2012-12-20 NOTE — Assessment & Plan Note (Signed)
Followed by endocrinology 

## 2012-12-20 NOTE — Progress Notes (Signed)
  Subjective:    Patient ID: Tami Estrada, female    DOB: [birth year], 61 y.o.   MRN: [id number]  HPI   Lower back pain ,urinary pressure and dysuria on and off for past 3 days, no fever, no constipation, no change in bowels, no injury, no abd pain  URI symptoms x 1 weeks but feels better overall, minimal cough, congested in nose some   Review of Systems  - per above    GEN- denies fatigue, fever, weight loss,weakness, +recent illness HEENT- denies eye drainage, change in vision, +nasal discharge, CVS- denies chest pain, palpitations RESP- denies SOB,+ cough, wheeze ABD- denies N/V, change in stools, abd pain GU- + dysuria, denies  hematuria, dribbling, incontinence MSK- + joint pain, muscle aches, injury      Objective:   Physical Exam  GEN- NAD, alert and oriented x3 HEENT- PERRL, EOMI, non injected sclera, pink conjunctiva, MMM, oropharynx clear Neck- no LAD CVS- RRR, no murmur RESP-CTAB ABD-NABS,soft,NT,ND,no CVA tenderness Back- SPine NT, neg SLR, mild TTP left paraspinal region HIP- FROM MSK- normal strength LE, normal sensation EXT- No edema Pulses- Radial, DP- 2+       Assessment & Plan:

## 2012-12-20 NOTE — Assessment & Plan Note (Signed)
Currently resolving, supportive care, OTC cough med if needed, she is not concerned over this

## 2012-12-20 NOTE — Assessment & Plan Note (Signed)
UA negative, but concern based on symptoms, send for culture, given IM Rocephin x 1 dose in office Fluids

## 2013-05-05 ENCOUNTER — Encounter: Payer: Self-pay | Admitting: Family Medicine

## 2014-09-03 ENCOUNTER — Other Ambulatory Visit (HOSPITAL_COMMUNITY): Payer: Self-pay | Admitting: Pulmonary Disease

## 2014-09-03 ENCOUNTER — Ambulatory Visit (HOSPITAL_COMMUNITY)
Admission: RE | Admit: 2014-09-03 | Discharge: 2014-09-03 | Disposition: A | Discharge status: Home / Self Care | Payer: Medicare HMO | Source: Ambulatory Visit | Attending: Pulmonary Disease | Admitting: Pulmonary Disease

## 2014-09-03 DIAGNOSIS — X58XXXA Exposure to other specified factors, initial encounter: Secondary | ICD-10-CM | POA: Diagnosis not present

## 2014-09-03 DIAGNOSIS — M25519 Pain in unspecified shoulder: Secondary | ICD-10-CM | POA: Insufficient documentation

## 2014-09-03 DIAGNOSIS — S4980XA Other specified injuries of shoulder and upper arm, unspecified arm, initial encounter: Secondary | ICD-10-CM | POA: Insufficient documentation

## 2014-09-03 DIAGNOSIS — M25511 Pain in right shoulder: Secondary | ICD-10-CM

## 2014-09-03 DIAGNOSIS — S46909A Unspecified injury of unspecified muscle, fascia and tendon at shoulder and upper arm level, unspecified arm, initial encounter: Secondary | ICD-10-CM | POA: Diagnosis not present

## 2014-09-20 ENCOUNTER — Other Ambulatory Visit (HOSPITAL_COMMUNITY): Payer: Self-pay | Admitting: Pulmonary Disease

## 2014-09-20 DIAGNOSIS — G90511 Complex regional pain syndrome I of right upper limb: Secondary | ICD-10-CM

## 2014-09-26 ENCOUNTER — Ambulatory Visit (HOSPITAL_COMMUNITY): Payer: Medicare HMO

## 2014-09-28 ENCOUNTER — Ambulatory Visit (HOSPITAL_COMMUNITY)
Admission: RE | Admit: 2014-09-28 | Discharge: 2014-09-28 | Disposition: A | Discharge status: Home / Self Care | Payer: Medicare HMO | Source: Ambulatory Visit | Attending: Pulmonary Disease | Admitting: Pulmonary Disease

## 2014-09-28 DIAGNOSIS — M7551 Bursitis of right shoulder: Secondary | ICD-10-CM | POA: Insufficient documentation

## 2014-09-28 DIAGNOSIS — G90511 Complex regional pain syndrome I of right upper limb: Secondary | ICD-10-CM | POA: Insufficient documentation

## 2014-09-28 DIAGNOSIS — M25511 Pain in right shoulder: Secondary | ICD-10-CM | POA: Diagnosis present

## 2014-10-25 ENCOUNTER — Ambulatory Visit (INDEPENDENT_AMBULATORY_CARE_PROVIDER_SITE_OTHER): Payer: Medicare HMO | Admitting: Orthopedic Surgery

## 2014-10-25 ENCOUNTER — Encounter: Payer: Self-pay | Admitting: Orthopedic Surgery

## 2014-10-25 VITALS — BP 134/76 | Ht 62.0 in | Wt 103.0 lb

## 2014-10-25 DIAGNOSIS — M75101 Unspecified rotator cuff tear or rupture of right shoulder, not specified as traumatic: Secondary | ICD-10-CM

## 2014-10-25 NOTE — Patient Instructions (Signed)
Call to arrange therapy  Joint Injection Care After Refer to this sheet in the next few days. These instructions provide you with information on caring for yourself after you have had a joint injection. Your caregiver also may give you more specific instructions. Your treatment has been planned according to current medical practices, but problems sometimes occur. Call your caregiver if you have any problems or questions after your procedure. After any type of joint injection, it is not uncommon to experience:  Soreness, swelling, or bruising around the injection site.  Mild numbness, tingling, or weakness around the injection site caused by the numbing medicine used before or with the injection. It also is possible to experience the following effects associated with the specific agent after injection:  Iodine-based contrast agents:  Allergic reaction (itching, hives, widespread redness, and swelling beyond the injection site).  Corticosteroids (These effects are rare.):  Allergic reaction.  Increased blood sugar levels (If you have diabetes and you notice that your blood sugar levels have increased, notify your caregiver).  Increased blood pressure levels.  Mood swings.  Hyaluronic acid in the use of viscosupplementation.  Temporary heat or redness.  Temporary rash and itching.  Increased fluid accumulation in the injected joint. These effects all should resolve within a day after your procedure.  HOME CARE INSTRUCTIONS  Limit yourself to light activity the day of your procedure. Avoid lifting heavy objects, bending, stooping, or twisting.  Take prescription or over-the-counter pain medication as directed by your caregiver.  You may apply ice to your injection site to reduce pain and swelling the day of your procedure. Ice may be applied 03-04 times:  Put ice in a plastic bag.  Place a towel between your skin and the bag.  Leave the ice on for no longer than 15-20  minutes each time. SEEK IMMEDIATE MEDICAL CARE IF:   Pain and swelling get worse rather than better or extend beyond the injection site.  Numbness does not go away.  Blood or fluid continues to leak from the injection site.  You have chest pain.  You have swelling of your face or tongue.  You have trouble breathing or you become dizzy.  You develop a fever, chills, or severe tenderness at the injection site that last longer than 1 day. MAKE SURE YOU:  Understand these instructions.  Watch your condition.  Get help right away if you are not doing well or if you get worse. Document Released: 08/20/2011 Document Revised: 02/29/2012 Document Reviewed: 08/20/2011 [facility] Patient Information 2015 [place], [place]. This information is not intended to replace advice given to you by your health care provider. Make sure you discuss any questions you have with your health care provider.
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
## 2014-10-29 ENCOUNTER — Ambulatory Visit (HOSPITAL_COMMUNITY)
Admission: RE | Admit: 2014-10-29 | Discharge: 2014-10-29 | Disposition: A | Discharge status: Home / Self Care | Payer: Medicare HMO | Source: Ambulatory Visit | Attending: Orthopedic Surgery | Admitting: Orthopedic Surgery

## 2014-10-29 ENCOUNTER — Encounter (HOSPITAL_COMMUNITY): Payer: Self-pay

## 2014-10-29 DIAGNOSIS — M25511 Pain in right shoulder: Secondary | ICD-10-CM | POA: Diagnosis not present

## 2014-10-29 DIAGNOSIS — Z5189 Encounter for other specified aftercare: Secondary | ICD-10-CM | POA: Diagnosis present

## 2014-10-29 DIAGNOSIS — M75101 Unspecified rotator cuff tear or rupture of right shoulder, not specified as traumatic: Secondary | ICD-10-CM

## 2014-10-29 DIAGNOSIS — M6281 Muscle weakness (generalized): Secondary | ICD-10-CM | POA: Insufficient documentation
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
## 2014-10-29 NOTE — Patient Instructions (Signed)
  Flexibility: Corner Stretch   Standing in corner with hands just above shoulder level and feet __24__ inches from corner, lean forward until a comfortable stretch is felt across chest. Hold __5__ seconds. Repeat __10__ times per set. Do __1__ sets per session. Do _1-2___ sessions per day.  http://orth.exer.us/342   Copyright  VHI. All rights reserved.   Scapular Retraction (Standing)   With arms at sides, pinch shoulder blades together. Repeat _10___ times per set. Do __1__ sets per session. Do __1-2__ sessions per day.  http://orth.exer.us/944   Copyright  VHI. All rights reserved.   ROM: Towel Stretch - with Interior Rotation   Pull left arm up behind back by pulling towel up with other arm. Hold _5___ seconds. Repeat _10___ times per set. Do __1__ sets per session. Do __1-2__ sessions per day.  http://orth.exer.us/888   Copyright  VHI. All rights reserved.   Posterior Capsule Stretch   Stand or sit, one arm across body so hand rests over opposite shoulder. Gently push on crossed elbow with other hand until stretch is felt in shoulder of crossed arm. Hold _5__ seconds.  Repeat _10__ times per session. Do _1-2__ sessions per day.  Copyright  VHI. All rights reserved.   Flexors Stretch, Standing   Stand near wall and slide arm up, with palm facing away from wall, by leaning toward wall. Hold _5__ seconds.  Repeat _10__ times per session. Do _1-2__ sessions per day.  Copyright  VHI. All rights reserved.

## 2014-11-01 ENCOUNTER — Ambulatory Visit (HOSPITAL_COMMUNITY): Payer: Medicare HMO

## 2014-11-02 ENCOUNTER — Ambulatory Visit (HOSPITAL_COMMUNITY)
Admission: RE | Admit: 2014-11-02 | Discharge: 2014-11-02 | Disposition: A | Discharge status: Home / Self Care | Payer: Medicare HMO | Source: Ambulatory Visit | Attending: Pulmonary Disease | Admitting: Pulmonary Disease

## 2014-11-02 ENCOUNTER — Encounter (HOSPITAL_COMMUNITY): Payer: Self-pay

## 2014-11-02 DIAGNOSIS — Z5189 Encounter for other specified aftercare: Secondary | ICD-10-CM | POA: Diagnosis not present

## 2014-11-02 DIAGNOSIS — M6281 Muscle weakness (generalized): Secondary | ICD-10-CM

## 2014-11-02 DIAGNOSIS — M25511 Pain in right shoulder: Secondary | ICD-10-CM

## 2014-11-02 DIAGNOSIS — M75101 Unspecified rotator cuff tear or rupture of right shoulder, not specified as traumatic: Secondary | ICD-10-CM

## 2014-11-02 NOTE — Therapy (Signed)
Occupational Therapy Treatment  Patient Details  Name: Tami Estrada MRN: [id number] Date of Birth: [birth year]  Encounter Date: 11/02/2014      OT End of Session - 11/02/14 1428    Visit Number 2   Number of Visits 12   Date for OT Re-Evaluation 11/26/14   Authorization Type [employer] Medicare HMO   Authorization Time Period Before 10th visit   Authorization - Visit Number 2   Authorization - Number of Visits 10   OT Start Time 1347   OT Stop Time 1426   OT Time Calculation (min) 39 min   Activity Tolerance Patient tolerated treatment well   Behavior During Therapy [facility] for tasks assessed/performed      Past Medical History  Diagnosis Date  . History of [facility] spotted fever   . Thyroid disease   . GERD (gastroesophageal reflux disease)     Esophogeal dysmotility- 2010    Past Surgical History  Procedure Laterality Date  . Thyroid surgery    . Back surgery    . Neck surgery      There were no vitals taken for this visit.  Visit Diagnosis:  Rotator cuff syndrome of right shoulder  Muscle weakness (generalized)  Pain in right shoulder      Subjective Assessment - 11/02/14 1348    Symptoms "Its hurting right bad today, but I haven't taken a pain pill either."   Currently in Pain? Yes   Pain Score 6    Pain Location Shoulder   Pain Orientation Right            OT Treatments/Exercises (OP) - 11/02/14 1349    Shoulder Exercises: Supine   Protraction PROM;AAROM;10 reps   Horizontal ABduction PROM;AAROM;10 reps   External Rotation PROM;AAROM;10 reps   Internal Rotation PROM;AAROM;10 reps   Flexion PROM;AAROM;10 reps   ABduction PROM;AAROM;10 reps   Shoulder Exercises: Therapy Ball   Flexion --  12 reps   ABduction --  12 reps   Manual Therapy   Manual Therapy Myofascial release   Myofascial Release MFR and manual stretching to RUe bicep,, upper arm, upper trap and scap regions to decrease fascial restrictions and promote decreased pain with  motion            OT Short Term Goals - 11/02/14 1429    OT SHORT TERM GOAL #1   Title Patient will be educated on HEP.    Status On-going   OT SHORT TERM GOAL #2   Title Patient will decrease pain to 5/10 during daily activities.    Status On-going   OT SHORT TERM GOAL #3   Title Patient will decrease fascial restrictions from mod to min amount.    Status On-going   OT SHORT TERM GOAL #4   Title Patient will increase strength to 4/5 to increase ability to pick up objects when completing yardwork.    Status On-going          OT Long Term Goals - 11/02/14 1429    OT LONG TERM GOAL #1   Title Patient will return to highest level of functioning/independence during all daily and leisure tasks.    Status On-going   OT LONG TERM GOAL #2   Title Patient will decrease pain to 2/10 or less during daily activities.    Status On-going   OT LONG TERM GOAL #3   Title Patient will decrease fascial restrictions to min amount or less.    Status On-going   OT LONG  TERM GOAL #4   Title Patient will increase strength to 5/5 to increase ability to use leaf blower.    Status On-going          Plan - 11/02/14 1429    Clinical Impression Statement Initiated MFR, PROM,a nd AAROM exercises this session. Pt indicated good stretch with all exercises. Pt stated she LUE was providing significant assist to [place] during AAROM exercises. Educated pt on importance of feling stretch during exercises vs. causing pain. Added ball stretches in flexion and abduction. pt tolerated well with min difficulty. Pt expressed slight pain in arm at end of session.      Rehab Potential Good   OT Frequency 2x / week   OT Duration 6 weeks   Plan Add pro/ret/elev/dep, thumbtacks, and R/L ball        Problem List Patient Active Problem List   Diagnosis Date Noted  . Common cold 12/20/2012  . Back pain 12/20/2012  . Dysuria 12/20/2012  . Left knee pain 03/11/2012  . Hypothyroidism 03/11/2012  .  Post-menopausal 03/11/2012      Bea Graff Amous Crewe, MS, OTR/L [place] [phone] 11/02/2014, 2:32 PM

## 2014-11-05 ENCOUNTER — Ambulatory Visit (HOSPITAL_COMMUNITY)
Admission: RE | Admit: 2014-11-05 | Discharge: 2014-11-05 | Disposition: A | Discharge status: Home / Self Care | Payer: Medicare HMO | Source: Ambulatory Visit | Attending: Pulmonary Disease | Admitting: Pulmonary Disease

## 2014-11-05 ENCOUNTER — Encounter (HOSPITAL_COMMUNITY): Payer: Self-pay

## 2014-11-05 DIAGNOSIS — M25511 Pain in right shoulder: Secondary | ICD-10-CM

## 2014-11-05 DIAGNOSIS — M75101 Unspecified rotator cuff tear or rupture of right shoulder, not specified as traumatic: Secondary | ICD-10-CM

## 2014-11-05 DIAGNOSIS — Z5189 Encounter for other specified aftercare: Secondary | ICD-10-CM | POA: Diagnosis not present

## 2014-11-05 DIAGNOSIS — M6281 Muscle weakness (generalized): Secondary | ICD-10-CM

## 2014-11-05 NOTE — Therapy (Signed)
Occupational Therapy Treatment  Patient Details  Name: Tami Estrada MRN: [id number] Date of Birth: 02-Mar-1951  Encounter Date: 11/05/2014      OT End of Session - 11/05/14 1607    Visit Number 3   Number of Visits 12   Date for OT Re-Evaluation 11/26/14   Authorization Type [employer] Medicare HMO   Authorization Time Period Before 10th visit   Authorization - Visit Number 3   Authorization - Number of Visits 10   OT Start Time 1520   OT Stop Time 1600   OT Time Calculation (min) 40 min   Activity Tolerance Patient tolerated treatment well   Behavior During Therapy [place] for tasks assessed/performed      Past Medical History  Diagnosis Date  . History of [facility] spotted fever   . Thyroid disease   . GERD (gastroesophageal reflux disease)     Esophogeal dysmotility- 2010    Past Surgical History  Procedure Laterality Date  . Thyroid surgery    . Back surgery    . Neck surgery      There were no vitals taken for this visit.  Visit Diagnosis:  Rotator cuff syndrome of right shoulder  Muscle weakness (generalized)  Pain in right shoulder      Subjective Assessment - 11/05/14 1534    Symptoms S: It doesn't hurt too bad today.     Pain Right shoulder 5/10 aching      [facility] OT Assessment - 11/05/14 1533    Precautions   Precautions None          OT Treatments/Exercises (OP) - 11/05/14 1533    Shoulder Exercises: Supine   Protraction PROM;10 reps;AROM;12 reps   Horizontal ABduction PROM;10 reps;AROM;12 reps   External Rotation PROM;10 reps;AROM;12 reps   Internal Rotation PROM;10 reps;AROM;12 reps   Flexion PROM;10 reps;AROM;12 reps   ABduction PROM;10 reps;AROM;12 reps   Shoulder Exercises: Standing   Protraction AROM;12 reps   Horizontal ABduction AROM;12 reps   External Rotation AROM;12 reps   Internal Rotation AROM;12 reps   Flexion AROM;12 reps   ABduction AROM;12 reps   Extension Theraband;10 reps   Theraband Level (Shoulder Extension)  Level 2 (Red)   Row Theraband;10 reps   Theraband Level (Shoulder Row) Level 2 (Red)   Retraction Theraband;10 reps   Theraband Level (Shoulder Retraction) Level 2 (Red)   Shoulder Exercises: ROM/Strengthening   Thumb Tacks 1'   Proximal Shoulder Strengthening, Supine 12X   Proximal Shoulder Strengthening, Seated 12X   Prot/Ret//Elev/Dep 1'            OT Short Term Goals - 11/05/14 1534    OT SHORT TERM GOAL #1   Title Patient will be educated on HEP.    Time 3   Period Weeks   Status On-going   OT SHORT TERM GOAL #2   Title Patient will decrease pain to 5/10 during daily activities.    Time 3   Period Weeks   Status On-going   OT SHORT TERM GOAL #3   Title Patient will decrease fascial restrictions from mod to min amount.    Time 3   Period Weeks   Status On-going   OT SHORT TERM GOAL #4   Title Patient will increase strength to 4/5 to increase ability to pick up objects when completing yardwork.    Time 3   Period Weeks   Status On-going          OT Long Term Goals - 11/05/14  [place] #1   Title Patient will return to highest level of functioning/independence during all daily and leisure tasks.    Time 6   Period Weeks   Status On-going   OT LONG TERM GOAL #2   Title Patient will decrease pain to 2/10 or less during daily activities.    Time 6   Period Weeks   Status On-going   OT LONG TERM GOAL #3   Title Patient will decrease fascial restrictions to min amount or less.    Time 6   Period Weeks   Status On-going   OT LONG TERM GOAL #4   Title Patient will increase strength to 5/5 to increase ability to use leaf blower.    Time 6   Period Weeks   Status On-going          Plan - 11/05/14 1607    Clinical Impression Statement A: Added AROM supine and standing, proximal shoulder strengthening, thumb tacks, pro/ret/elev/dep, and scapular theraband exercises. Patient tolerated well with some pain noted during supine abduction. R/L  therapy ball not completed due to time constraint.    Plan P: Add X to V arms and wall wash. Add AROM to HEP.        Problem List Patient Active Problem List   Diagnosis Date Noted  . Common cold 12/20/2012  . Back pain 12/20/2012  . Dysuria 12/20/2012  . Left knee pain 03/11/2012  . Hypothyroidism 03/11/2012  . Post-menopausal 03/11/2012      Ailene Ravel, OTR/L,CBIS   11/05/2014, 4:11 PM

## 2014-11-07 ENCOUNTER — Ambulatory Visit (HOSPITAL_COMMUNITY)
Admission: RE | Admit: 2014-11-07 | Discharge: 2014-11-07 | Disposition: A | Discharge status: Home / Self Care | Payer: Medicare HMO | Source: Ambulatory Visit | Attending: Pulmonary Disease | Admitting: Pulmonary Disease

## 2014-11-07 ENCOUNTER — Encounter (HOSPITAL_COMMUNITY): Payer: Self-pay

## 2014-11-07 DIAGNOSIS — M25511 Pain in right shoulder: Secondary | ICD-10-CM

## 2014-11-07 DIAGNOSIS — Z5189 Encounter for other specified aftercare: Secondary | ICD-10-CM | POA: Diagnosis not present

## 2014-11-07 DIAGNOSIS — M75101 Unspecified rotator cuff tear or rupture of right shoulder, not specified as traumatic: Secondary | ICD-10-CM

## 2014-11-07 DIAGNOSIS — M6281 Muscle weakness (generalized): Secondary | ICD-10-CM

## 2014-11-07 NOTE — Patient Instructions (Addendum)
   ROM: Abduction (Standing)   Bring arms straight out from sides and raise as high as possible without pain. Repeat 15 times per set.Do _2-3___ sessions per day.  http://orth.exer.us/910   Copyright  VHI. All rights reserved.   Extension (Active) ROM: Extension (Standing)   Bring arms straight back as far as possible without pain. Repeat 15 times per set.  Do _2-3___ sessions per day.  http://orth.exer.us/916   Copyright  VHI. All rights reserved.   ROM: External / Internal Rotation - in Abduction (Standing)   With upper arms parallel to floor and elbows bent at right angles, gently rotate arms up then down as far as possible without pain. Hold elbow by your side, rather than at same level as shoulder. Repeat _15___ times per set.  Do 2-3____ sessions per day.  http://orth.exer.us/912   Copyright  VHI. All rights reserved.    Flexors Stretch (Active)   Stand, arms straight at sides. Bring arms straight forward and upward as high as possible without pain. Repeat 15___ times per session. Do 2-3___ sessions per day.  Copyright  VHI. All rights reserved.   Scapular Retraction (Standing)   With arms at sides, pinch shoulder blades together. Repeat _15___ times per set.  Do _2-3___ sessions per day.  http://orth.exer.us/944   Copyright  VHI. All rights reserved.   Bea Graff Cordarro Spinnato, MS, OTR/L [place]

## 2014-11-07 NOTE — Therapy (Signed)
Occupational Therapy Treatment  Patient Details  Name: Tami Estrada MRN: [id number] Date of Birth: 11-24-1951  Encounter Date: 11/07/2014      OT End of Session - 11/07/14 1524    Visit Number 4   Number of Visits 12   Date for OT Re-Evaluation 11/26/14   Authorization Type [employer] HMO   Authorization Time Period Before 10th visit   Authorization - Visit Number 4   Authorization - Number of Visits 10   OT Start Time 1437   OT Stop Time 1522   OT Time Calculation (min) 45 min   Activity Tolerance Patient tolerated treatment well   Behavior During Therapy [facility] for tasks assessed/performed      Past Medical History  Diagnosis Date  . History of [facility] spotted fever   . Thyroid disease   . GERD (gastroesophageal reflux disease)     Esophogeal dysmotility- 2010    Past Surgical History  Procedure Laterality Date  . Thyroid surgery    . Back surgery    . Neck surgery      There were no vitals taken for this visit.  Visit Diagnosis:  Rotator cuff syndrome of right shoulder  Muscle weakness (generalized)  Pain in right shoulder      Subjective Assessment - 11/07/14 1439    Symptoms "its not too bad, its not hurting like it was yesterday."   Currently in Pain? Yes   Pain Score 3    Pain Location Shoulder   Pain Orientation Right   Pain Descriptors / Indicators Aching   Pain Type Acute pain            OT Treatments/Exercises (OP) - 11/07/14 1441    Shoulder Exercises: Supine   Protraction PROM;10 reps;AROM;15 reps   Horizontal ABduction PROM;10 reps;AROM;15 reps   External Rotation PROM;10 reps;AROM;15 reps   Internal Rotation PROM;10 reps;AROM;15 reps   Flexion PROM;10 reps;AROM;15 reps   ABduction PROM;10 reps;AROM;15 reps   Shoulder Exercises: Standing   Protraction AROM;15 reps   Horizontal ABduction AROM;15 reps   External Rotation AROM;15 reps   Internal Rotation AROM;15 reps   Flexion AROM;15 reps   ABduction AROM;15 reps   Shoulder Exercises: ROM/Strengthening   "W" Arms 10x   X to V Arms 10x   Proximal Shoulder Strengthening, Supine 12X   Manual Therapy   Manual Therapy Myofascial release   Myofascial Release MFR and manual stretching to RUe bicep,, upper arm, upper trap and scap regions to decrease fascial restrictions and promote decreased pain with motion            OT Education - 11/07/14 1523    Education provided Yes   Education Details AROM HEP   Person(s) Educated Patient   Methods Explanation;Demonstration;Handout   Comprehension Verbalized understanding;Returned demonstration          OT Short Term Goals - 11/07/14 1527    OT SHORT TERM GOAL #1   Title Patient will be educated on HEP.    Status On-going   OT SHORT TERM GOAL #2   Title Patient will decrease pain to 5/10 during daily activities.    Status On-going   OT SHORT TERM GOAL #3   Title Patient will decrease fascial restrictions from mod to min amount.    Status On-going   OT SHORT TERM GOAL #4   Title Patient will increase strength to 4/5 to increase ability to pick up objects when completing yardwork.    Status On-going  OT Long Term Goals - 11/07/14 1527    OT LONG TERM GOAL #1   Title Patient will return to highest level of functioning/independence during all daily and leisure tasks.    Status On-going   OT LONG TERM GOAL #2   Title Patient will decrease pain to 2/10 or less during daily activities.    Status On-going   OT LONG TERM GOAL #3   Title Patient will decrease fascial restrictions to min amount or less.    Status On-going   OT LONG TERM GOAL #4   Title Patient will increase strength to 5/5 to increase ability to use leaf blower.    Status On-going          Plan - 11/07/14 1524    Clinical Impression Statement Increased supine and standing reps to 15 AROM - pt tolerated well with min fatigue. Verbalizes little soreness after previous session. Added x-v and w arms.  Pt required cueing for  form, and completed x-v with RUE only.  Provided pt with handout for AROM HEP - pt indicated understanding.   did not add wall wash due to timing.   Plan Add wall wash. Add 1# to supine and standing exercises.  Continue to provide cueing as needed for form.        Problem List Patient Active Problem List   Diagnosis Date Noted  . Common cold 12/20/2012  . Back pain 12/20/2012  . Dysuria 12/20/2012  . Left knee pain 03/11/2012  . Hypothyroidism 03/11/2012  . Post-menopausal 03/11/2012     Bea Graff Shamere Campas, MS, OTR/L [place] [phone] 11/07/2014, 3:28 PM

## 2014-11-12 ENCOUNTER — Ambulatory Visit (HOSPITAL_COMMUNITY): Payer: Medicare HMO

## 2014-11-13 ENCOUNTER — Encounter (HOSPITAL_COMMUNITY): Payer: Medicare HMO

## 2014-11-14 ENCOUNTER — Encounter (HOSPITAL_COMMUNITY): Payer: Medicare HMO

## 2014-11-19 ENCOUNTER — Encounter (HOSPITAL_COMMUNITY): Payer: Self-pay

## 2014-11-19 ENCOUNTER — Ambulatory Visit (HOSPITAL_COMMUNITY)
Admission: RE | Admit: 2014-11-19 | Discharge: 2014-11-19 | Disposition: A | Discharge status: Home / Self Care | Payer: Medicare HMO | Source: Ambulatory Visit | Attending: Pulmonary Disease | Admitting: Pulmonary Disease

## 2014-11-19 DIAGNOSIS — Z5189 Encounter for other specified aftercare: Secondary | ICD-10-CM | POA: Diagnosis not present

## 2014-11-19 DIAGNOSIS — M75101 Unspecified rotator cuff tear or rupture of right shoulder, not specified as traumatic: Secondary | ICD-10-CM

## 2014-11-19 DIAGNOSIS — M25511 Pain in right shoulder: Secondary | ICD-10-CM

## 2014-11-19 DIAGNOSIS — M6281 Muscle weakness (generalized): Secondary | ICD-10-CM

## 2014-11-19 NOTE — Therapy (Signed)
Occupational Therapy Treatment  Patient Details  Name: Tami Estrada MRN: [id number] Date of Birth: November 05, 1951  Encounter Date: 11/19/2014      OT End of Session - 11/19/14 1609    Visit Number 5   Number of Visits 12   Date for OT Re-Evaluation 11/26/14   Authorization Type [employer] Medicare HMO   Authorization Time Period Before 10th visit   Authorization - Visit Number 5   Authorization - Number of Visits 10   OT Start Time 1523   OT Stop Time 1606   OT Time Calculation (min) 43 min   Activity Tolerance Patient tolerated treatment well   Behavior During Therapy [facility] for tasks assessed/performed      Past Medical History  Diagnosis Date  . History of [facility] spotted fever   . Thyroid disease   . GERD (gastroesophageal reflux disease)     Esophogeal dysmotility- 2010    Past Surgical History  Procedure Laterality Date  . Thyroid surgery    . Back surgery    . Neck surgery      There were no vitals taken for this visit.  Visit Diagnosis:  Rotator cuff syndrome of right shoulder  Muscle weakness (generalized)  Pain in right shoulder      Subjective Assessment - 11/19/14 1521    Symptoms "Its hurting today... but I did a lot of wrapping."   Currently in Pain? Yes   Pain Score 5    Pain Location Shoulder   Pain Orientation Right   Pain Descriptors / Indicators Aching   Pain Type Acute pain            OT Treatments/Exercises (OP) - 11/19/14 1525    Shoulder Exercises: Supine   Protraction PROM;5 reps;Strengthening;12 reps;Weights   Protraction Weight (lbs) 1   Horizontal ABduction PROM;5 reps;Strengthening;12 reps;Weights   Horizontal ABduction Weight (lbs) 1   External Rotation PROM;5 reps;Strengthening;12 reps;Weights   External Rotation Weight (lbs) 1   Internal Rotation PROM;5 reps;Strengthening;12 reps;Weights   Internal Rotation Weight (lbs) 1   Flexion PROM;5 reps;Right;12 reps;Weights   Shoulder Flexion Weight (lbs) 1   ABduction  PROM;5 reps;Strengthening;12 reps   Shoulder ABduction Weight (lbs) 1   Shoulder Exercises: Standing   Protraction Strengthening;Weights;10 reps   Protraction Weight (lbs) 1   Horizontal ABduction Strengthening;10 reps;Weights   Horizontal ABduction Weight (lbs) 1   External Rotation Strengthening;10 reps;Weights   External Rotation Weight (lbs) 1   Internal Rotation Strengthening;10 reps;Weights   Internal Rotation Weight (lbs) 1   Flexion Strengthening;10 reps;Weights   Shoulder Flexion Weight (lbs) 1   ABduction AROM;10 reps   Extension Theraband;15 reps   Theraband Level (Shoulder Extension) Level 2 (Red)   Row Theraband;15 reps   Theraband Level (Shoulder Row) Level 2 (Red)   Retraction Theraband;15 reps   Theraband Level (Shoulder Retraction) Level 2 (Red)   Shoulder Exercises: ROM/Strengthening   Wall Wash 1 min   "W" Arms 15x   X to V Arms 15x   Proximal Shoulder Strengthening, Supine 10x with 1# weights  max verbal cueng for small, controlled movements   Proximal Shoulder Strengthening, Seated 10x each with 1# weight   Manual Therapy   Manual Therapy Myofascial release   Myofascial Release MFR and manual stretching to RUE bicep, upper arm, upper trap and scap regions to decrease fascial restrictions and promote decreased pain with motion.  Added MFR to anterior shoulder region.  OT Short Term Goals - 11/19/14 1612    OT SHORT TERM GOAL #1   Title Patient will be educated on HEP.    Status On-going   OT SHORT TERM GOAL #2   Title Patient will decrease pain to 5/10 during daily activities.    Status On-going   OT SHORT TERM GOAL #3   Title Patient will decrease fascial restrictions from mod to min amount.    Status On-going   OT SHORT TERM GOAL #4   Title Patient will increase strength to 4/5 to increase ability to pick up objects when completing yardwork.    Status On-going          OT Long Term Goals - 11/19/14 1612    OT LONG TERM GOAL #1    Title Patient will return to highest level of functioning/independence during all daily and leisure tasks.    Status On-going   OT LONG TERM GOAL #2   Title Patient will decrease pain to 2/10 or less during daily activities.    Status On-going   OT LONG TERM GOAL #3   Title Patient will decrease fascial restrictions to min amount or less.    Status On-going   OT LONG TERM GOAL #4   Title Patient will increase strength to 5/5 to increase ability to use leaf blower.    Status On-going          Plan - 11/19/14 1609    Clinical Impression Statement Pt with increased tightness in anteror shoulder region this session.  Increased MFR to region to decrease fascial restritions.  Added 1# weight to supine and standing exercises.  Pt requried inceased encouragement with abduction in supine, and preformed abduction in standing AROM only.  Increased cueing needed for proximal shoulder strrengthening this session for increased control of movements. Added wall wash, and pt tolerated well.       Plan continue 1# weights for supine and standing - attempt to add 1# to abduction standing.  Increased manual to anteior shoulder region - add corner stretch as needed for tightness.        Problem List Patient Active Problem List   Diagnosis Date Noted  . Common cold 12/20/2012  . Back pain 12/20/2012  . Dysuria 12/20/2012  . Left knee pain 03/11/2012  . Hypothyroidism 03/11/2012  . Post-menopausal 03/11/2012      Bea Graff Jaidyn Usery, [place], OTR/L [place] [phone] 11/19/2014, 4:13 PM

## 2014-11-21 ENCOUNTER — Encounter (HOSPITAL_COMMUNITY): Payer: Self-pay

## 2014-11-21 ENCOUNTER — Ambulatory Visit (HOSPITAL_COMMUNITY)
Admission: RE | Admit: 2014-11-21 | Discharge: 2014-11-21 | Disposition: A | Discharge status: Home / Self Care | Payer: Medicare HMO | Source: Ambulatory Visit | Attending: Pulmonary Disease | Admitting: Pulmonary Disease

## 2014-11-21 DIAGNOSIS — Z5189 Encounter for other specified aftercare: Secondary | ICD-10-CM | POA: Insufficient documentation

## 2014-11-21 DIAGNOSIS — M25511 Pain in right shoulder: Secondary | ICD-10-CM | POA: Insufficient documentation

## 2014-11-21 DIAGNOSIS — M6281 Muscle weakness (generalized): Secondary | ICD-10-CM | POA: Insufficient documentation

## 2014-11-21 DIAGNOSIS — M75101 Unspecified rotator cuff tear or rupture of right shoulder, not specified as traumatic: Secondary | ICD-10-CM

## 2014-11-21 NOTE — Therapy (Signed)
[facility] [address] [place], [place], [address] Phone: [phone]   Fax:  [phone]  Occupational Therapy Treatment  Patient Details  Name: Tami Estrada MRN: [id number] Date of Birth: 02/26/1951  Encounter Date: 11/21/2014      OT End of Session - 11/21/14 1603    Visit Number 6   Number of Visits 12   Date for OT Re-Evaluation 11/26/14   Authorization Type [employer] Medicare HMO   Authorization Time Period Before 10th visit   Authorization - Visit Number 6   Authorization - Number of Visits 10   OT Start Time 1529   OT Stop Time 1606   OT Time Calculation (min) 37 min   Activity Tolerance Patient tolerated treatment well   Behavior During Therapy [facility] for tasks assessed/performed      Past Medical History  Diagnosis Date  . History of [facility] spotted fever   . Thyroid disease   . GERD (gastroesophageal reflux disease)     Esophogeal dysmotility- 2010    Past Surgical History  Procedure Laterality Date  . Thyroid surgery    . Back surgery    . Neck surgery      There were no vitals taken for this visit.  Visit Diagnosis:  Rotator cuff syndrome of right shoulder  Muscle weakness (generalized)  Pain in right shoulder      Subjective Assessment - 11/21/14 1529    Symptoms S: I feel like the pain hasn't gotten any better.   Currently in Pain? Yes   Pain Score 4    Pain Location Shoulder   Pain Orientation Right   Pain Descriptors / Indicators Aching   Pain Type Acute pain          [facility] OT Assessment - 11/21/14 1545    Precautions   Precautions None          OT Treatments/Exercises (OP) - 11/21/14 1544    Shoulder Exercises: Supine   Protraction PROM;5 reps;Strengthening;12 reps;Weights   Protraction Weight (lbs) 1   Horizontal ABduction PROM;5 reps;Strengthening;12 reps;Weights   Horizontal ABduction Weight (lbs) 1   External Rotation PROM;5 reps;Strengthening;12 reps;Weights   External Rotation  Weight (lbs) 1   Internal Rotation PROM;5 reps;Strengthening;12 reps;Weights   Internal Rotation Weight (lbs) 1   Flexion PROM;5 reps;Right;12 reps;Weights   Shoulder Flexion Weight (lbs) 1   ABduction PROM;5 reps;AROM;12 reps   Shoulder ABduction Weight (lbs) --   Shoulder Exercises: Standing   Protraction Strengthening;Weights;10 reps   Protraction Weight (lbs) 1   Horizontal ABduction Strengthening;10 reps;Weights   Horizontal ABduction Weight (lbs) 1   External Rotation Strengthening;10 reps;Weights   External Rotation Weight (lbs) 1   Internal Rotation Strengthening;10 reps;Weights   Internal Rotation Weight (lbs) 1   Flexion Strengthening;10 reps;Weights   Shoulder Flexion Weight (lbs) 1   ABduction Strengthening;10 reps;Limitations   Shoulder ABduction Weight (lbs) 1   ABduction Limitations Able to achieve approx. 60% of range.   Shoulder Exercises: ROM/Strengthening   UBE (Upper Arm Bike) Level 1 3' forward 3' reverse   Proximal Shoulder Strengthening, Supine 12X with 1#   Proximal Shoulder Strengthening, Seated 10x each with 1# weight   Manual Therapy   Manual Therapy Myofascial release   Myofascial Release Muscle energy technique used with right medial deltoid to relax tone and improve range of motion.            OT Short Term Goals - 11/21/14 1545    OT SHORT TERM  GOAL #1   Title Patient will be educated on HEP.    Status On-going   OT SHORT TERM GOAL #2   Title Patient will decrease pain to 5/10 during daily activities.    Status On-going   OT SHORT TERM GOAL #3   Title Patient will decrease fascial restrictions from mod to min amount.    Status On-going   OT SHORT TERM GOAL #4   Title Patient will increase strength to 4/5 to increase ability to pick up objects when completing yardwork.    Status On-going          OT Long Term Goals - 11/21/14 1546    OT LONG TERM GOAL #1   Title Patient will return to highest level of functioning/independence during  all daily and leisure tasks.    Status On-going   OT LONG TERM GOAL #2   Title Patient will decrease pain to 2/10 or less during daily activities.    Status On-going   OT LONG TERM GOAL #3   Title Patient will decrease fascial restrictions to min amount or less.    Status On-going   OT LONG TERM GOAL #4   Title Patient will increase strength to 5/5 to increase ability to use leaf blower.    Status On-going          Plan - 11/21/14 1604    Clinical Impression Statement A: Patient unable to complete Abduction supine with weight due to pain. Slight pain felt with passive stretching during shoulder flexion and abduction. Abduction was less tight than flexion. Added UBE bike. Patient tolerated well.    Plan P:REASSESS         Problem List Patient Active Problem List   Diagnosis Date Noted  . Common cold 12/20/2012  . Back pain 12/20/2012  . Dysuria 12/20/2012  . Left knee pain 03/11/2012  . Hypothyroidism 03/11/2012  . Post-menopausal 03/11/2012    Ailene Ravel, OTR/L,CBIS  [phone]  11/21/2014, 4:12 PM

## 2014-11-26 ENCOUNTER — Encounter (HOSPITAL_COMMUNITY): Payer: Self-pay

## 2014-11-26 ENCOUNTER — Ambulatory Visit (HOSPITAL_COMMUNITY)
Admission: RE | Admit: 2014-11-26 | Discharge: 2014-11-26 | Disposition: A | Discharge status: Home / Self Care | Payer: Medicare HMO | Source: Ambulatory Visit | Attending: Pulmonary Disease | Admitting: Pulmonary Disease

## 2014-11-26 DIAGNOSIS — M6281 Muscle weakness (generalized): Secondary | ICD-10-CM

## 2014-11-26 DIAGNOSIS — Z5189 Encounter for other specified aftercare: Secondary | ICD-10-CM | POA: Diagnosis not present

## 2014-11-26 DIAGNOSIS — M25511 Pain in right shoulder: Secondary | ICD-10-CM

## 2014-11-26 DIAGNOSIS — M75101 Unspecified rotator cuff tear or rupture of right shoulder, not specified as traumatic: Secondary | ICD-10-CM

## 2014-11-26 NOTE — Therapy (Signed)
[facility] [address] [place], [place], [address] Phone: [phone]   Fax:  [phone]  Occupational Therapy Reassessment and Treatment  Patient Details  Name: Tami Estrada MRN: [id number] Date of Birth: 11/10/1951  Encounter Date: 11/26/2014      OT End of Session - 11/26/14 1600    Visit Number 7   Number of Visits 12   Date for OT Re-Evaluation 12/24/14   Authorization Type [employer] Medicare HMO   Authorization Time Period Before 17th visit   Authorization - Visit Number 7   Authorization - Number of Visits 17   OT Start Time 1518   OT Stop Time 1600   OT Time Calculation (min) 42 min   Activity Tolerance Patient tolerated treatment well   Behavior During Therapy [facility] for tasks assessed/performed      Past Medical History  Diagnosis Date  . History of [facility] spotted fever   . Thyroid disease   . GERD (gastroesophageal reflux disease)     Esophogeal dysmotility- 2010    Past Surgical History  Procedure Laterality Date  . Thyroid surgery    . Back surgery    . Neck surgery      There were no vitals taken for this visit.  Visit Diagnosis:  Rotator cuff syndrome of right shoulder  Muscle weakness (generalized)  Pain in right shoulder      Subjective Assessment - 11/26/14 1523    Currently in Pain? No/denies          [facility] OT Assessment - 11/26/14 1538    Assessment   Diagnosis Rotator cuff syndrome of right shoulder   Precautions   Precautions None   Observation/Other Assessments   Observations Trace fascial restrictions in right upper arm, trapezius, and scapularis region.    AROM   Overall AROM Comments Assessed in standing, IR/ER adducted   Right Shoulder Flexion 165 Degrees   Right Shoulder ABduction 152 Degrees   Right Shoulder Internal Rotation 90 Degrees   Right Shoulder External Rotation 75 Degrees   PROM   Overall PROM Comments Assessed standing. IR/ER adducted   Right Shoulder Flexion 172  Degrees   Right Shoulder ABduction 161 Degrees   Right Shoulder Internal Rotation 90 Degrees   Right Shoulder External Rotation 90 Degrees   Strength   Overall Strength Comments Assessed seated. IR/ER adducted   Right Shoulder Flexion --  same on eval   Right Shoulder ABduction --  4-/5 (3+/5 on eval)   Right Shoulder Internal Rotation 4/5  on eval: 3+/6   Right Shoulder External Rotation 3+/5  same on eval          OT Treatments/Exercises (OP) - 11/26/14 1554    Shoulder Exercises: ROM/Strengthening   UBE (Upper Arm Bike) Level 1 3' forward 3' reverse            OT Short Term Goals - 11/26/14 1544    OT SHORT TERM GOAL #1   Title Patient will be educated on HEP.    Status Achieved   OT SHORT TERM GOAL #2   Title Patient will decrease pain to 5/10 during daily activities.    Status Achieved   OT SHORT TERM GOAL #3   Title Patient will decrease fascial restrictions from mod to min amount.    Status Achieved   OT SHORT TERM GOAL #4   Title Patient will increase strength to 4/5 to increase ability to pick up objects when completing yardwork.  Status On-going          OT Long Term Goals - Dec 14, 2014 1546    OT LONG TERM GOAL #1   Title Patient will return to highest level of functioning/independence during all daily and leisure tasks.    Status On-going   OT LONG TERM GOAL #2   Title Patient will decrease pain to 2/10 or less during daily activities.    Status On-going   OT LONG TERM GOAL #3   Title Patient will decrease fascial restrictions to min amount or less.    Status Achieved   OT LONG TERM GOAL #4   Title Patient will increase strength to 5/5 to increase ability to use leaf blower.    Status On-going          Plan - 12-14-2014 1600    Clinical Impression Statement A: Reassessment completed this date. patient met 4/5 STGs and 1/5LTGs. Patient reports that her pain level has decreased significantly although it is still 5 or 4/10. Patient would like to  continue therapy to focus on strengthening.    Plan P: cont therapy to focus on strength with good form. MFR PRN. Try completing exercises prone and sidelying instead of supine if they are too painful. Work on strengthening with the extremity in the position of comfort.           G-Codes - 12-14-14 1606    Functional Assessment Tool Used FOTO score:64/100 (36% impaired)   Functional Limitation Carrying, moving and handling objects   Carrying, Moving and Handling Objects Current Status [phone]) At least 20 percent but less than 40 percent impaired, limited or restricted   Carrying, Moving and Handling Objects Goal Status ([id number]) At least 1 percent but less than 20 percent impaired, limited or restricted       Problem List Patient Active Problem List   Diagnosis Date Noted  . Common cold 12/20/2012  . Back pain 12/20/2012  . Dysuria 12/20/2012  . Left knee pain 03/11/2012  . Hypothyroidism 03/11/2012  . Post-menopausal 03/11/2012    Ailene Ravel, OTR/L,CBIS  [phone]  2014-12-14, 4:12 PM

## 2014-11-28 ENCOUNTER — Encounter (HOSPITAL_COMMUNITY): Payer: Self-pay

## 2014-11-28 ENCOUNTER — Ambulatory Visit (HOSPITAL_COMMUNITY)
Admission: RE | Admit: 2014-11-28 | Discharge: 2014-11-28 | Disposition: A | Discharge status: Home / Self Care | Payer: Medicare HMO | Source: Ambulatory Visit | Attending: Pulmonary Disease | Admitting: Pulmonary Disease

## 2014-11-28 DIAGNOSIS — M25511 Pain in right shoulder: Secondary | ICD-10-CM

## 2014-11-28 DIAGNOSIS — M75101 Unspecified rotator cuff tear or rupture of right shoulder, not specified as traumatic: Secondary | ICD-10-CM

## 2014-11-28 DIAGNOSIS — M6281 Muscle weakness (generalized): Secondary | ICD-10-CM

## 2014-11-28 DIAGNOSIS — Z5189 Encounter for other specified aftercare: Secondary | ICD-10-CM | POA: Diagnosis not present

## 2014-11-28 NOTE — Therapy (Signed)
[facility] [address] [place], [place], [address] Phone: [phone]   Fax:  [phone]  Occupational Therapy Treatment  Patient Details  Name: Tami Estrada MRN: [id number] Date of Birth: 02-18-1951  Encounter Date: 11/28/2014      OT End of Session - 11/28/14 1234    Visit Number 8   Number of Visits 12   Date for OT Re-Evaluation 12/24/14   Authorization Type [employer] HMO   Authorization Time Period Before 17th visit   Authorization - Visit Number 8   Authorization - Number of Visits 17   OT Start Time 1159   OT Stop Time 1238   OT Time Calculation (min) 39 min   Activity Tolerance Patient tolerated treatment well   Behavior During Therapy [facility] for tasks assessed/performed      Past Medical History  Diagnosis Date  . History of [facility] spotted fever   . Thyroid disease   . GERD (gastroesophageal reflux disease)     Esophogeal dysmotility- 2010    Past Surgical History  Procedure Laterality Date  . Thyroid surgery    . Back surgery    . Neck surgery      There were no vitals taken for this visit.  Visit Diagnosis:  Rotator cuff syndrome of right shoulder  Muscle weakness (generalized)  Pain in right shoulder      Subjective Assessment - 11/28/14 1200    Symptoms "I think its getting better!"   Currently in Pain? No/denies            OT Treatments/Exercises (OP) - 11/28/14 1201    Shoulder Exercises: Supine   Protraction PROM;5 reps;Strengthening;12 reps;Weights   Protraction Weight (lbs) 1   Horizontal ABduction PROM;5 reps;Strengthening;12 reps;Weights   Horizontal ABduction Weight (lbs) 1   External Rotation PROM;5 reps;Strengthening;12 reps;Weights   External Rotation Weight (lbs) 1   Internal Rotation PROM;5 reps;Strengthening;12 reps;Weights   Internal Rotation Weight (lbs) 1   Flexion PROM;5 reps;Right;12 reps;Weights   Shoulder Flexion Weight (lbs) 1   ABduction PROM;5 reps;Strengthening   9 reps of attempted 12 completed with 1#   Shoulder ABduction Weight (lbs) 1   Shoulder Exercises: Sidelying   ABduction AROM;10 reps  with soem increased pain   Shoulder Exercises: Standing   Protraction Strengthening;Weights;10 reps   Protraction Weight (lbs) 1   Horizontal ABduction Strengthening;10 reps;Weights   Horizontal ABduction Weight (lbs) 1   External Rotation Strengthening;10 reps;Weights   External Rotation Weight (lbs) 1   Internal Rotation Strengthening;10 reps;Weights   Internal Rotation Weight (lbs) 1   Flexion Strengthening;10 reps;Weights   Shoulder Flexion Weight (lbs) 1   ABduction Strengthening;10 reps;Limitations  improved range and pain levels this session   Shoulder ABduction Weight (lbs) 1   Shoulder Exercises: ROM/Strengthening   UBE (Upper Arm Bike) Level 1 3' forward 3' reverse   "W" Arms 10x with 1#   X to V Arms 10x with 1#   Proximal Shoulder Strengthening, Supine 12X with 1#  cueing for form   Proximal Shoulder Strengthening, Seated 10x each with 1# weight   Manual Therapy   Manual Therapy Myofascial release   Myofascial Release PRN MFR to RUE RUE pec minor region completed this session, to decrease fascial restirctions and promote decreased pain with motion.            OT Short Term Goals - 11/28/14 1235    OT SHORT TERM GOAL #1   Title Patient will be educated on  HEP.    Status Achieved   OT SHORT TERM GOAL #2   Title Patient will decrease pain to 5/10 during daily activities.    Status Achieved   OT SHORT TERM GOAL #3   Title Patient will decrease fascial restrictions from mod to min amount.    Status Achieved   OT SHORT TERM GOAL #4   Title Patient will increase strength to 4/5 to increase ability to pick up objects when completing yardwork.    Status On-going          OT Long Term Goals - 11/28/14 1236    OT LONG TERM GOAL #1   Title Patient will return to highest level of functioning/independence during all daily and  leisure tasks.    Status On-going   OT LONG TERM GOAL #2   Title Patient will decrease pain to 2/10 or less during daily activities.    Status On-going   OT LONG TERM GOAL #3   Title Patient will decrease fascial restrictions to min amount or less.    Status Achieved   OT LONG TERM GOAL #4   Title Patient will increase strength to 5/5 to increase ability to use leaf blower.    Status On-going          Plan - 11/28/14 1235    Clinical Impression Statement Pt arrived with significantly decreased pain in RUE this session.  Preformed MFR to ped minor region to decrease fascial restirctions - no other MFR needed.  1# exercises with imporved pain levels overall, but pt still had difficulty with abduction.  Attempted abduction in sidelying, with continued pain.  Pt had improved tolerance of standing abduction this session.       Plan MFR PRN.  Attempt prone exercises for decreased pain.  If pain levels continue to be low, increase standing 1# reps.         Problem List Patient Active Problem List   Diagnosis Date Noted  . Common cold 12/20/2012  . Back pain 12/20/2012  . Dysuria 12/20/2012  . Left knee pain 03/11/2012  . Hypothyroidism 03/11/2012  . Post-menopausal 03/11/2012    Bea Graff Anahi Belmar, MS, OTR/L [place] [phone] 11/28/2014, 12:37 PM

## 2014-12-03 ENCOUNTER — Ambulatory Visit (HOSPITAL_COMMUNITY)
Admission: RE | Admit: 2014-12-03 | Discharge: 2014-12-03 | Disposition: A | Discharge status: Home / Self Care | Payer: Medicare HMO | Source: Ambulatory Visit | Attending: Pulmonary Disease | Admitting: Pulmonary Disease

## 2014-12-03 ENCOUNTER — Encounter (HOSPITAL_COMMUNITY): Payer: Self-pay

## 2014-12-03 DIAGNOSIS — M75101 Unspecified rotator cuff tear or rupture of right shoulder, not specified as traumatic: Secondary | ICD-10-CM

## 2014-12-03 DIAGNOSIS — M6281 Muscle weakness (generalized): Secondary | ICD-10-CM

## 2014-12-03 DIAGNOSIS — M25511 Pain in right shoulder: Secondary | ICD-10-CM

## 2014-12-03 DIAGNOSIS — Z5189 Encounter for other specified aftercare: Secondary | ICD-10-CM | POA: Diagnosis not present

## 2014-12-03 NOTE — Therapy (Signed)
[facility] [address] [place], [place], [address] Phone: [phone]   Fax:  [phone]  Occupational Therapy Treatment  Patient Details  Name: Tami Estrada MRN: [id number] Date of Birth: 06/18/1951  Encounter Date: 12/03/2014      OT End of Session - 12/03/14 1608    Visit Number 9   Number of Visits 12   Date for OT Re-Evaluation 12/24/14   Authorization Type [employer] Medicare HMO   Authorization Time Period Before 17th visit   Authorization - Visit Number 9   Authorization - Number of Visits 17   OT Start Time 5053   OT Stop Time 1602   OT Time Calculation (min) 39 min   Activity Tolerance Patient tolerated treatment well   Behavior During Therapy [facility] for tasks assessed/performed      Past Medical History  Diagnosis Date  . History of [facility] spotted fever   . Thyroid disease   . GERD (gastroesophageal reflux disease)     Esophogeal dysmotility- 2010    Past Surgical History  Procedure Laterality Date  . Thyroid surgery    . Back surgery    . Neck surgery      There were no vitals taken for this visit.  Visit Diagnosis:  Rotator cuff syndrome of right shoulder  Muscle weakness (generalized)  Pain in right shoulder      Subjective Assessment - 12/03/14 1521    Symptoms "It hasn't been doing as good, its been hurting a little this time."   Currently in Pain? Yes   Pain Score 7    Pain Location Shoulder   Pain Orientation Right   Pain Descriptors / Indicators Sore;Aching          [facility] OT Assessment - 12/03/14 0001    Precautions   Precautions None          OT Treatments/Exercises (OP) - 12/03/14 1524    Shoulder Exercises: Supine   Protraction PROM;5 reps;Strengthening;12 reps;Weights   Protraction Weight (lbs) 1   Horizontal ABduction PROM;5 reps;Strengthening;12 reps;Weights   Horizontal ABduction Weight (lbs) 1   External Rotation PROM;5 reps;Strengthening;12 reps;Weights   External Rotation  Weight (lbs) 1   Internal Rotation PROM;5 reps;Strengthening;12 reps;Weights   Internal Rotation Weight (lbs) 1   Flexion PROM;5 reps;Right;12 reps;Weights   Shoulder Flexion Weight (lbs) 1   ABduction PROM;5 reps;Strengthening;12 reps  4 reps with 1#, remaining AROM   Shoulder ABduction Weight (lbs) 1   Shoulder Exercises: Standing   Protraction Strengthening;12 reps   Protraction Weight (lbs) 1   Horizontal ABduction Strengthening;12 reps   Horizontal ABduction Weight (lbs) 1   External Rotation Strengthening;12 reps   External Rotation Weight (lbs) 1   Internal Rotation Strengthening;12 reps   Internal Rotation Weight (lbs) 1   Flexion Strengthening;12 reps   Shoulder Flexion Weight (lbs) 1   ABduction Strengthening;12 reps   Shoulder ABduction Weight (lbs) 1   ABduction Limitations decreased range over previous session   Shoulder Exercises: ROM/Strengthening   UBE (Upper Arm Bike) Level 2, 3' forward and 3' backwards   "W" Arms 10x with 1#   X to V Arms 10x with 1#   Proximal Shoulder Strengthening, Supine 12X with 1#  circles with RUE only   Shoulder Exercises: Stretch   Other Shoulder Stretches Prayer Deltoid Stretch - 15 reps sliding up and down wall. HEP provided.   Manual Therapy   Manual Therapy Myofascial release   Myofascial Release MFR and manual stretching  to [facility] bicep,, upper arm, upper trap and scap regions to decrease fascial restrictions and promote decreased pain with motion.            OT Education - 12/03/14 1608    Education provided Yes   Education Details Deltoid Prayer HEP   Person(s) Educated Patient   Methods Explanation;Demonstration;Handout   Comprehension Verbalized understanding;Returned demonstration          OT Short Term Goals - 12/03/14 1610    OT SHORT TERM GOAL #1   Title Patient will be educated on HEP.    Status Achieved   OT SHORT TERM GOAL #2   Title Patient will decrease pain to 5/10 during daily activities.    Status  Achieved   OT SHORT TERM GOAL #3   Title Patient will decrease fascial restrictions from mod to min amount.    Status Achieved   OT SHORT TERM GOAL #4   Title Patient will increase strength to 4/5 to increase ability to pick up objects when completing yardwork.    Status On-going          OT Long Term Goals - 12/03/14 1611    OT LONG TERM GOAL #1   Title Patient will return to highest level of functioning/independence during all daily and leisure tasks.    Status On-going   OT LONG TERM GOAL #2   Title Patient will decrease pain to 2/10 or less during daily activities.    Status On-going   OT LONG TERM GOAL #3   Title Patient will decrease fascial restrictions to min amount or less.    Status Achieved   OT LONG TERM GOAL #4   Title Patient will increase strength to 5/5 to increase ability to use leaf blower.    Status On-going          Plan - 12/03/14 1608    Clinical Impression Statement pt arrived with increased pain over rpevious session.  Was able to engage in 1# exercises, but with incresed difficutly.  completed supine abduction in AROM due to increased pain.  Increased resistance on UBE and pt toleraetd well, but verbalized fatigue.  Added wall preyer deltoid strengthening exercise to exercises and HEP, for increased deltoid engagement.  Pt demonstrated well and verbalized understanding. MFRp preformed this session due to increased tightness in cervical neck region.   Plan MFR PRN.  Follow up on new deltoid prayer exercise. continue 1# weight as able.      Problem List Patient Active Problem List   Diagnosis Date Noted  . Common cold 12/20/2012  . Back pain 12/20/2012  . Dysuria 12/20/2012  . Left knee pain 03/11/2012  . Hypothyroidism 03/11/2012  . Post-menopausal 03/11/2012    Bea Graff Miguel Christiana, MS, OTR/L [place] [phone] 12/03/2014, 4:12 PM

## 2014-12-03 NOTE — Patient Instructions (Signed)
SHOULDER: Flexion At Wall - Deltoid Strengthening   Clasp hands, and cross right thumb over left thumb to engage muscle in upper right arm. Hold hands on wall (no need to hold elbow to wall as shown in picture).  Slide hands up and down wall (chest to forehead levels), keeping right thumb pressed and muscle engaged.  Smoothly slide up and down  for 1 minute. Repeat 4-6 times per day.   Copyright  VHI. All rights reserved.   Tami Graff Aujanae Mccullum, MS, OTR/L [place]

## 2014-12-05 ENCOUNTER — Encounter (HOSPITAL_COMMUNITY): Payer: Self-pay

## 2014-12-05 ENCOUNTER — Ambulatory Visit (HOSPITAL_COMMUNITY)
Admission: RE | Admit: 2014-12-05 | Discharge: 2014-12-05 | Disposition: A | Discharge status: Home / Self Care | Payer: Medicare HMO | Source: Ambulatory Visit | Attending: Pulmonary Disease | Admitting: Pulmonary Disease

## 2014-12-05 DIAGNOSIS — Z5189 Encounter for other specified aftercare: Secondary | ICD-10-CM | POA: Diagnosis not present

## 2014-12-05 DIAGNOSIS — M75101 Unspecified rotator cuff tear or rupture of right shoulder, not specified as traumatic: Secondary | ICD-10-CM

## 2014-12-05 DIAGNOSIS — M25511 Pain in right shoulder: Secondary | ICD-10-CM

## 2014-12-05 DIAGNOSIS — M6281 Muscle weakness (generalized): Secondary | ICD-10-CM

## 2014-12-05 NOTE — Therapy (Signed)
[facility] [address] [place], [place], [address] Phone: [phone]   Fax:  [phone]  Occupational Therapy Treatment  Patient Details  Name: Tami Estrada MRN: [id number] Date of Birth: 1951/01/31  Encounter Date: 12/05/2014      OT End of Session - 12/05/14 1518    Visit Number 10   Number of Visits 12   Date for OT Re-Evaluation 12/24/14   Authorization Type [employer] Medicare HMO   Authorization Time Period Before 17th visit   Authorization - Visit Number 10   Authorization - Number of Visits 17   OT Start Time 1515   OT Stop Time 1558   OT Time Calculation (min) 43 min   Activity Tolerance Patient tolerated treatment well   Behavior During Therapy [facility] for tasks assessed/performed      Past Medical History  Diagnosis Date  . History of [facility] spotted fever   . Thyroid disease   . GERD (gastroesophageal reflux disease)     Esophogeal dysmotility- 2010    Past Surgical History  Procedure Laterality Date  . Thyroid surgery    . Back surgery    . Neck surgery      There were no vitals taken for this visit.  Visit Diagnosis:  Rotator cuff syndrome of right shoulder  Muscle weakness (generalized)  Pain in right shoulder      Subjective Assessment - 12/05/14 1514    Symptoms "Would that cause my neck to be sore?"   Currently in Pain? Yes   Pain Score 7    Pain Location Shoulder   Pain Orientation Right   Pain Descriptors / Indicators Aching;Sore          [facility] OT Assessment - 12/05/14 1517    Precautions   Precautions None          OT Treatments/Exercises (OP) - 12/05/14 1517    Shoulder Exercises: Supine   Protraction PROM;5 reps;Strengthening;12 reps;Weights   Protraction Weight (lbs) 1   Horizontal ABduction PROM;5 reps;Strengthening;12 reps;Weights   Horizontal ABduction Weight (lbs) 1   External Rotation PROM;5 reps;Strengthening;12 reps;Weights   External Rotation Weight (lbs) 1   Internal  Rotation PROM;5 reps;Strengthening;12 reps;Weights   Internal Rotation Weight (lbs) 1   Flexion PROM;5 reps;Right;12 reps;Weights   Shoulder Flexion Weight (lbs) 1   ABduction PROM;5 reps;Strengthening;12 reps   Shoulder ABduction Weight (lbs) 1   ABduction Limitations 60% range   Shoulder Exercises: Standing   Protraction Strengthening;12 reps   Protraction Weight (lbs) 1   Horizontal ABduction Strengthening;12 reps   Horizontal ABduction Weight (lbs) 1   External Rotation Strengthening;12 reps   External Rotation Weight (lbs) 1   Internal Rotation Strengthening;12 reps   Internal Rotation Weight (lbs) 1   Flexion Strengthening;12 reps   Shoulder Flexion Weight (lbs) 1   ABduction Strengthening;12 reps   Shoulder ABduction Weight (lbs) 1   ABduction Limitations full range this session   Extension Theraband;10 reps   Theraband Level (Shoulder Extension) Level 3 (Green)   Row [employer] reps   Theraband Level (Shoulder Row) Level 3 (Green)   Retraction Theraband;10 reps   Theraband Level (Shoulder Retraction) Level 3 (Green)   Shoulder Exercises: ROM/Strengthening   "W" Arms 12x with 1#  with R arm only   X to V Arms 12x with 1#   Proximal Shoulder Strengthening, Supine 12X with 1#  circles with RUE noly   Proximal Shoulder Strengthening, Seated 12x with 1# weight   Manual Therapy  Manual Therapy Myofascial release   Myofascial Release PRN MFR upper trap region to decrease pain and tightness in right cervical neck region.  posterior Cervical neck Positional release to right upper trap to reduce trigger point pain and relax muscle.            OT Short Term Goals - 12/05/14 1607    OT SHORT TERM GOAL #4   Title Patient will increase strength to 4/5 to increase ability to pick up objects when completing yardwork.    Status On-going          OT Long Term Goals - 12/05/14 1608    OT LONG TERM GOAL #1   Title Patient will return to highest level of  functioning/independence during all daily and leisure tasks.    Status On-going   OT LONG TERM GOAL #2   Title Patient will decrease pain to 2/10 or less during daily activities.    Status On-going   OT LONG TERM GOAL #4   Title Patient will increase strength to 5/5 to increase ability to use leaf blower.    Status On-going          Plan - 12/05/14 1602    Clinical Impression Statement Pt arrived with increased right cervical neck tightness this session - preformed MFR and positional release, with some relief.  Able to tolerate all reps with 1# abduction this session, but with decreased range in supine, and increased pain with full range in standing.  increased theraband to green this session, and pt tolerated well.     Plan Re-evaluate.  Pt is suggesting d/c.         Problem List Patient Active Problem List   Diagnosis Date Noted  . Common cold 12/20/2012  . Back pain 12/20/2012  . Dysuria 12/20/2012  . Left knee pain 03/11/2012  . Hypothyroidism 03/11/2012  . Post-menopausal 03/11/2012    Bea Graff Sherle Mello, MS, OTR/L [place] [phone] 12/05/2014, 4:09 PM

## 2014-12-10 ENCOUNTER — Ambulatory Visit (HOSPITAL_COMMUNITY): Payer: Medicare HMO

## 2014-12-12 ENCOUNTER — Ambulatory Visit (HOSPITAL_COMMUNITY): Payer: Medicare HMO | Admitting: Specialist

## 2014-12-18 ENCOUNTER — Ambulatory Visit (HOSPITAL_COMMUNITY)
Admission: RE | Admit: 2014-12-18 | Discharge: 2014-12-18 | Disposition: A | Discharge status: Home / Self Care | Payer: Medicare HMO | Source: Ambulatory Visit | Attending: Orthopedic Surgery | Admitting: Orthopedic Surgery

## 2014-12-18 ENCOUNTER — Encounter (HOSPITAL_COMMUNITY): Payer: Self-pay

## 2014-12-18 DIAGNOSIS — M6281 Muscle weakness (generalized): Secondary | ICD-10-CM

## 2014-12-18 DIAGNOSIS — Z5189 Encounter for other specified aftercare: Secondary | ICD-10-CM | POA: Diagnosis not present

## 2014-12-18 DIAGNOSIS — M75101 Unspecified rotator cuff tear or rupture of right shoulder, not specified as traumatic: Secondary | ICD-10-CM

## 2014-12-18 DIAGNOSIS — M25511 Pain in right shoulder: Secondary | ICD-10-CM

## 2014-12-18 NOTE — Therapy (Signed)
[place], [place], [address] Phone: [phone]   Fax:  [phone]  Occupational Therapy Treatment  Patient Details  Name: Tami Estrada MRN: [id number] Date of Birth: 10-Dec-1951  Encounter Date: 12/18/2014      OT End of Session - 12/18/14 1642    Visit Number 11   Number of Visits 12   Date for OT Re-Evaluation 12/24/14   Authorization Type [employer] Medicare HMO   Authorization Time Period Before 17th visit   Authorization - Visit Number 11   Authorization - Number of Visits 17   OT Start Time 1601   OT Stop Time 1644   OT Time Calculation (min) 43 min   Activity Tolerance Patient tolerated treatment well   Behavior During Therapy [facility] for tasks assessed/performed      Past Medical History  Diagnosis Date  . History of [facility] spotted fever   . Thyroid disease   . GERD (gastroesophageal reflux disease)     Esophogeal dysmotility- 2010    Past Surgical History  Procedure Laterality Date  . Thyroid surgery    . Back surgery    . Neck surgery      There were no vitals taken for this visit.  Visit Diagnosis:  Rotator cuff syndrome of right shoulder  Muscle weakness (generalized)  Pain in right shoulder      Subjective Assessment - 12/18/14 1607    Symptoms "Its not hurting me today, but it gave me a fit all weekend.  What really hurts is when something goes to fall and I try to catch it."   Currently in Pain? No/denies                 OT Treatments/Exercises (OP) - 12/18/14 1608    Shoulder Exercises: Supine   Protraction PROM;5 reps;Strengthening;10 reps   Protraction Weight (lbs) 2   Horizontal ABduction PROM;5 reps;Strengthening;10 reps   Horizontal ABduction Weight (lbs) 2   External Rotation PROM;5 reps;Strengthening;10 reps   External Rotation Weight (lbs) 2   Internal Rotation PROM;5 reps;Right;10 reps   Internal Rotation Weight (lbs) 2   Flexion PROM;5  reps;Strengthening;10 reps   Shoulder Flexion Weight (lbs) 2   ABduction PROM;5 reps;Strengthening;10 reps   Shoulder ABduction Weight (lbs) 2   ABduction Limitations 60% range   Shoulder Exercises: Standing   Protraction Strengthening;10 reps   Protraction Weight (lbs) 2   Horizontal ABduction Strengthening;10 reps   Horizontal ABduction Weight (lbs) 2   External Rotation Strengthening;10 reps   External Rotation Weight (lbs) 2   Internal Rotation Strengthening;10 reps   Internal Rotation Weight (lbs) 2   Flexion Strengthening;10 reps   Shoulder Flexion Weight (lbs) 2   ABduction Strengthening;10 reps   Shoulder ABduction Weight (lbs) 2   ABduction Limitations 85% range   Shoulder Exercises: ROM/Strengthening   UBE (Upper Arm Bike) Level 3, 3' forward and 3' backwards   "W" Arms 10x with 2#   X to V Arms 10x with 2#  R arm only for improved form   Proximal Shoulder Strengthening, Supine 10x with 2#   Proximal Shoulder Strengthening, Seated 10x with 2#   Manual Therapy   Manual Therapy Myofascial release   Myofascial Release PRN MFR upper trap region and ped minor region to decrease pain and tightness                  OT Short Term Goals - 12/05/14 1607  OT SHORT TERM GOAL #4   Title Patient will increase strength to 4/5 to increase ability to pick up objects when completing yardwork.    Status On-going           OT Long Term Goals - 12/05/14 1608    OT LONG TERM GOAL #1   Title Patient will return to highest level of functioning/independence during all daily and leisure tasks.    Status On-going   OT LONG TERM GOAL #2   Title Patient will decrease pain to 2/10 or less during daily activities.    Status On-going   OT LONG TERM GOAL #4   Title Patient will increase strength to 5/5 to increase ability to use leaf blower.    Status On-going            Plan - 12/18/14 1648    Clinical Impression Statement Pt has not been to theray in last wek due to  holiday and family scheduling.  Incrased supine and standing exercises to 2#, and pt toelrated well.  Pt continues to have increased difficutly with supine abduction, but has improved tolerance and ROM in standing.   pt indicates she is pleased with therapy and how her arm has progressed, but stillhas days of increased pain.  Re-eval not compelted this session due to waiting until just prior to next MD appointment.       Plan Re-evaluate for likely d/c.        Problem List Patient Active Problem List   Diagnosis Date Noted  . Common cold 12/20/2012  . Back pain 12/20/2012  . Dysuria 12/20/2012  . Left knee pain 03/11/2012  . Hypothyroidism 03/11/2012  . Post-menopausal 03/11/2012    Bea Graff Kamilla Hands, MS, OTR/L [place] [phone] 12/18/2014, 4:51 PM  [facility] [address] [place], [place], [address] Phone: [phone]   Fax:  [phone]

## 2014-12-24 ENCOUNTER — Ambulatory Visit (HOSPITAL_COMMUNITY): Payer: Medicare HMO | Attending: Orthopedic Surgery

## 2014-12-24 DIAGNOSIS — M6281 Muscle weakness (generalized): Secondary | ICD-10-CM | POA: Insufficient documentation

## 2014-12-24 DIAGNOSIS — M25511 Pain in right shoulder: Secondary | ICD-10-CM | POA: Insufficient documentation

## 2014-12-24 DIAGNOSIS — Z5189 Encounter for other specified aftercare: Secondary | ICD-10-CM | POA: Insufficient documentation

## 2014-12-27 ENCOUNTER — Ambulatory Visit (INDEPENDENT_AMBULATORY_CARE_PROVIDER_SITE_OTHER): Payer: Medicare HMO | Admitting: Orthopedic Surgery

## 2014-12-27 ENCOUNTER — Encounter: Payer: Self-pay | Admitting: Orthopedic Surgery

## 2014-12-27 VITALS — BP 128/71 | Ht 62.0 in | Wt 103.0 lb

## 2014-12-27 DIAGNOSIS — M75101 Unspecified rotator cuff tear or rupture of right shoulder, not specified as traumatic: Secondary | ICD-10-CM

## 2014-12-27 NOTE — Progress Notes (Signed)
Patient ID: Tami Estrada, female   DOB: 05/08/1951, 64 y.o.   MRN: [id number] Chief Complaint  Patient presents with  . Follow-up    2 month recheck right shoulder s/p therapy    Encounter Diagnosis  Name Primary?  . Rotator cuff syndrome of right shoulder Yes    BP 128/71 mmHg  Ht 5\' 2"  (1.575 m)  Wt 103 lb (46.72 kg)  BMI 18.83 kg/m2  Follow-up visit for this 64 year old female who has already had a MRI which showed thinning but intact supraspinatus tendon before meals joint hypertrophy. She has a six-month history of pain in her right shoulder. She had home exercises cortisone injection back in November. She reports 75% decrease in pain still has some occasional pain depending on position of the arm  Review of systems denies numbness tingling weakness  X-ray show intact rotator cuff strength painful range of motion after 150 positive impingement sign. Shoulder ligaments are stable. Motor exam normal skin intact good distal pulses and normal sensation lymph nodes are negative  Repeat injection for impingement syndrome  Follow-up as needed   Procedure note the subacromial injection shoulder RIGHT  Verbal consent was obtained to inject the  RIGHT   Shoulder  Timeout was completed to confirm the injection site is a subacromial space of the  RIGHT  shoulder   Medication used Depo-Medrol 40 mg and lidocaine 1% 3 cc  Anesthesia was provided by ethyl chloride  The injection was performed in the RIGHT  posterior subacromial space. After pinning the skin with alcohol and anesthetized the skin with ethyl chloride the subacromial space was injected using a 20-gauge needle. There were no complications  Sterile dressing was applied.

## 2015-01-22 ENCOUNTER — Encounter (HOSPITAL_COMMUNITY): Payer: Self-pay

## 2015-01-22 NOTE — Therapy (Signed)
[place], [place], [address] Phone: [phone]   Fax:  [phone]  Patient Details  Name: Tami Estrada MRN: [id number] Date of Birth: 1951/06/10 Referring Provider:  No ref. provider found  Encounter Date: 01/22/2015 OCCUPATIONAL THERAPY DISCHARGE SUMMARY  Visits from Start of Care: 11  Current functional level related to goals / functional outcomes: Patient being discharged from OT services due to not returning to clinic since 12/18/14. Patient only had one visit left for reassessment. Discharge was planned for patient. LTGs would have been met.    Remaining deficits: OT LONG TERM GOAL #1    Title Patient will return to highest level of functioning/independence during all daily and leisure tasks.    Status On-going   OT LONG TERM GOAL #2   Title Patient will decrease pain to 2/10 or less during daily activities.    Status On-going   OT LONG TERM GOAL #4   Title Patient will increase strength to 5/5 to increase ability to use leaf blower.    Status On-going      Education / Equipment: HEP for strengthening  Plan:                                                    Patient goals were met. Patient is being discharged due to not returning since the last visit.  ?????       Ailene Ravel, OTR/L,CBIS  [phone]  01/22/2015, 10:23 AM  [facility] [address] [place], [place], [address] Phone: [phone]   Fax:  [phone]

## 2015-01-28 ENCOUNTER — Telehealth: Payer: Self-pay | Admitting: Orthopedic Surgery

## 2015-01-28 NOTE — Telephone Encounter (Signed)
Patient and daughter Tami Estrada, called to relay that right shoulder is hurting again; last seen for this problem 12/27/14, at which time injection was done (as well as an injection in November of 2015.  I have scheduled patient for appointment, next available, in 2 weeks.  If any further recommendations, please advise.  Ph [phone]

## 2015-02-11 ENCOUNTER — Other Ambulatory Visit: Payer: Self-pay | Admitting: *Deleted

## 2015-02-11 ENCOUNTER — Ambulatory Visit (INDEPENDENT_AMBULATORY_CARE_PROVIDER_SITE_OTHER): Payer: Medicare HMO | Admitting: Orthopedic Surgery

## 2015-02-11 ENCOUNTER — Encounter: Payer: Self-pay | Admitting: Orthopedic Surgery

## 2015-02-11 VITALS — BP 122/65 | Ht 62.0 in | Wt 103.0 lb

## 2015-02-11 DIAGNOSIS — M75101 Unspecified rotator cuff tear or rupture of right shoulder, not specified as traumatic: Secondary | ICD-10-CM

## 2015-02-11 DIAGNOSIS — M19019 Primary osteoarthritis, unspecified shoulder: Secondary | ICD-10-CM

## 2015-02-11 DIAGNOSIS — M129 Arthropathy, unspecified: Secondary | ICD-10-CM

## 2015-02-11 NOTE — Patient Instructions (Signed)
Surgery March 4, Right shoulder arthroscopy + distal clavicle resection
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
## 2015-02-18 NOTE — Patient Instructions (Signed)
Tami Estrada  02/18/2015   Your procedure is scheduled on: 02/22/2015  Report to [facility] at  10  AM.  Call this number if you have problems the morning of surgery: [phone]   Remember:   Do not eat food or drink liquids after midnight.   Take these medicines the morning of surgery with A SIP OF WATER:  levothyroxine   Do not wear jewelry, make-up or nail polish.  Do not wear lotions, powders, or perfumes.   Do not shave 48 hours prior to surgery. Men may shave face and neck.  Do not bring valuables to the hospital.  [facility] is not responsible for any belongings or valuables.               Contacts, dentures or bridgework may not be worn into surgery.  Leave suitcase in the car. After surgery it may be brought to your room.  For patients admitted to the hospital, discharge time is determined by your treatment team.               Patients discharged the day of surgery will not be allowed to drive home.  Name and phone number of your driver: family  Special Instructions: Shower using CHG 2 nights before surgery and the night before surgery.  If you shower the day of surgery use CHG.  Use special wash - you have one bottle of CHG for all showers.  You should use approximately 1/3 of the bottle for each shower.   Please read over the following fact sheets that you were given: Pain Booklet, Coughing and Deep Breathing, Surgical Site Infection Prevention, Anesthesia Post-op Instructions and Care and Recovery After Surgery Arthroscopy Arthroscopy is a procedure in which a caregiver uses an arthroscope. An arthroscope is an instrument that allows your caregiver to look directly into a joint. It is like a small telescope attached to a video camera and is similar in size to a pencil. Arthroscopes let your caregiver see inside your joint on an attached television monitor. Most joints in the human body can be examined and surgery can be performed through the arthroscope using small  incisions. Prior to the use of arthroscopes, surgeries were done with larger open incisions, which require longer recovery times. On occasion, arthroscopic procedures result in complications such as bleeding, swelling, and pain. If a complication results, a longer recovery and rehabilitation may be required. INDICATIONS Arthroscopic procedures were developed to remove, repair, or replace (reconstruct) damaged tissue. Arthroscopy can be performed if the procedure involves trimming tissue, removing fragments of cartilage or bone (loose bodies) within joints, suctioning debris, biopsy of tissue, smoothing rough surfaces, removing inflamed tissue, shrinking tissue, or sewing (suturing), tacking, or stapling cartilage and ligaments. What can be done is dependent on many factors. Arthroscopy allows for surgeons to perform certain surgical procedures. Most of the surgeries you can go home the same day as the procedure (outpatient procedures) because the procedure does not cause as much trauma to the patient. Arthroscopy is a valuable diagnostic tool. Radiographs (such as X-ray and CT scans) have poor ability at showing soft tissue, whereas arthroscopy gives the caregiver direct visualization of soft tissue, cartilage, and bone. However, the emergence of magnetic resonance imaging (MRI) has lessened the need for arthroscopy as a diagnostic tool.  TECHNIQUE  Repair and reconstruction arthroscopic techniques may require additional and/or larger incisions than diagnostic arthroscopy portals (-inch incisions). The procedures are often more extensive in repair and  reconstruction than excision procedures. Therefore, the patient may need to stay in the hospital overnight after arthroscopic repair or reconstruction. These procedures also disrupt more tissue and discomfort may occur, so the temporary use of braces, casts, or crutches, as well as rehabilitation, may be needed.  In order to undergo an arthroscopic procedure,  a complete evaluation is necessary in order to provide the caregiver with as accurate a diagnosis as possible. Sometimes it is necessary to perform diagnostic arthroscopy before another surgery can be scheduled.  Both diagnostic and surgical arthroscopy can be performed under local anesthesia (only the joint is numbed), regional anesthesia (the operative limb is numbed), spinal or epidural anesthesia (only the lower extremities are numbed), or general anesthesia (you are completely asleep). The type of anesthetic is dependent on the patient, the surgeon, and the procedure being performed.  If you ask prior to the operation, you may be able to obtain pictures or a video from the arthroscopic camera.  Do not eat or drink anything for at least 8 hours before surgery. Food and drinks (including coffee) make general anesthesia more hazardous. SEEK MEDICAL CARE IF:  You experience pain, numbness, or coldness in the extremity operated on.  Blue, gray, or dark color appears in the fingers or toenails.  You have increased pain, swelling, redness, drainage, or bleeding in the surgical area despite rest, ice, elevation, and pain medications.  You have signs of infection, including a fever 102F (38.9C) or higher. Document Released: 07/08/2005 Document Revised: 04/23/2014 Document Reviewed: 03/21/2009 [facility] Patient Information 2015 [place], [place]. This information is not intended to replace advice given to you by your health care provider. Make sure you discuss any questions you have with your health care provider. PATIENT INSTRUCTIONS POST-ANESTHESIA  IMMEDIATELY FOLLOWING SURGERY:  Do not drive or operate machinery for the first twenty four hours after surgery.  Do not make any important decisions for twenty four hours after surgery or while taking narcotic pain medications or sedatives.  If you develop intractable nausea and vomiting or a severe headache please notify your doctor  immediately.  FOLLOW-UP:  Please make an appointment with your surgeon as instructed. You do not need to follow up with anesthesia unless specifically instructed to do so.  WOUND CARE INSTRUCTIONS (if applicable):  Keep a dry clean dressing on the anesthesia/puncture wound site if there is drainage.  Once the wound has quit draining you may leave it open to air.  Generally you should leave the bandage intact for twenty four hours unless there is drainage.  If the epidural site drains for more than 36-48 hours please call the anesthesia department.  QUESTIONS?:  Please feel free to call your physician or the hospital operator if you have any questions, and they will be happy to assist you.

## 2015-02-19 ENCOUNTER — Other Ambulatory Visit: Payer: Self-pay

## 2015-02-19 ENCOUNTER — Encounter (HOSPITAL_COMMUNITY): Payer: Self-pay

## 2015-02-19 ENCOUNTER — Encounter (HOSPITAL_COMMUNITY)
Admission: RE | Admit: 2015-02-19 | Discharge: 2015-02-19 | Disposition: A | Discharge status: Home / Self Care | Payer: Medicare HMO | Source: Ambulatory Visit | Attending: Orthopedic Surgery | Admitting: Orthopedic Surgery

## 2015-02-19 DIAGNOSIS — Z885 Allergy status to narcotic agent status: Secondary | ICD-10-CM | POA: Diagnosis not present

## 2015-02-19 DIAGNOSIS — E079 Disorder of thyroid, unspecified: Secondary | ICD-10-CM | POA: Diagnosis not present

## 2015-02-19 DIAGNOSIS — M19011 Primary osteoarthritis, right shoulder: Secondary | ICD-10-CM | POA: Diagnosis not present

## 2015-02-19 DIAGNOSIS — M75101 Unspecified rotator cuff tear or rupture of right shoulder, not specified as traumatic: Secondary | ICD-10-CM | POA: Diagnosis present

## 2015-02-19 DIAGNOSIS — M659 Synovitis and tenosynovitis, unspecified: Secondary | ICD-10-CM | POA: Diagnosis not present

## 2015-02-19 HISTORY — DX: Hypothyroidism, unspecified: E03.9

## 2015-02-19 LAB — BASIC METABOLIC PANEL
Anion gap: 3 — ABNORMAL LOW (ref 5–15)
BUN: 18 mg/dL (ref 6–23)
CO2: 28 mmol/L (ref 19–32)
CREATININE: 0.75 mg/dL (ref 0.50–1.10)
Calcium: 8.9 mg/dL (ref 8.4–10.5)
Chloride: 104 mmol/L (ref 96–112)
GFR, EST NON AFRICAN AMERICAN: 87 mL/min — AB (ref >90)
Glucose, Bld: 109 mg/dL — ABNORMAL HIGH (ref 70–99)
Potassium: 4.1 mmol/L (ref 3.5–5.1)
Sodium: 135 mmol/L (ref 135–145)

## 2015-02-19 LAB — CBC
HCT: 40.8 % (ref 36.0–46.0)
HEMOGLOBIN: 13.7 g/dL (ref 12.0–15.0)
MCH: 31.5 pg (ref 26.0–34.0)
MCHC: 33.6 g/dL (ref 30.0–36.0)
MCV: 93.8 fL (ref 78.0–100.0)
Platelets: 233 10*3/uL (ref 150–400)
RBC: 4.35 MIL/uL (ref 3.87–5.11)
RDW: 12.5 % (ref 11.5–15.5)
WBC: 6.4 10*3/uL (ref 4.0–10.5)

## 2015-02-19 NOTE — Pre-Procedure Instructions (Signed)
Patient given information to sign up for my chart at home. 

## 2015-02-20 ENCOUNTER — Telehealth: Payer: Self-pay | Admitting: Orthopedic Surgery

## 2015-02-20 NOTE — Telephone Encounter (Signed)
Regarding out-patient surgery scheduled 02/22/15 at [facility], planned CPT [phone], possible other code [id number]; contacted payor [facility]; per automated voice response system, no pre-authorization required for in-network providers; Ref# [id number]

## 2015-02-22 ENCOUNTER — Ambulatory Visit (HOSPITAL_COMMUNITY): Payer: Medicare HMO | Admitting: Anesthesiology

## 2015-02-22 ENCOUNTER — Encounter (HOSPITAL_COMMUNITY)
Admission: RE | Disposition: A | Discharge status: Home / Self Care | Payer: Self-pay | Source: Ambulatory Visit | Attending: Orthopedic Surgery

## 2015-02-22 ENCOUNTER — Ambulatory Visit (HOSPITAL_COMMUNITY)
Admission: RE | Admit: 2015-02-22 | Discharge: 2015-02-23 | Disposition: A | Discharge status: Home / Self Care | Payer: Medicare HMO | Source: Ambulatory Visit | Attending: Orthopedic Surgery | Admitting: Orthopedic Surgery

## 2015-02-22 ENCOUNTER — Encounter (HOSPITAL_COMMUNITY): Payer: Self-pay | Admitting: *Deleted

## 2015-02-22 DIAGNOSIS — M66321 Spontaneous rupture of flexor tendons, right upper arm: Secondary | ICD-10-CM | POA: Insufficient documentation

## 2015-02-22 DIAGNOSIS — M659 Synovitis and tenosynovitis, unspecified: Secondary | ICD-10-CM | POA: Insufficient documentation

## 2015-02-22 DIAGNOSIS — M19011 Primary osteoarthritis, right shoulder: Secondary | ICD-10-CM | POA: Diagnosis not present

## 2015-02-22 DIAGNOSIS — M75101 Unspecified rotator cuff tear or rupture of right shoulder, not specified as traumatic: Secondary | ICD-10-CM

## 2015-02-22 DIAGNOSIS — M66821 Spontaneous rupture of other tendons, right upper arm: Secondary | ICD-10-CM

## 2015-02-22 DIAGNOSIS — M19019 Primary osteoarthritis, unspecified shoulder: Secondary | ICD-10-CM | POA: Insufficient documentation

## 2015-02-22 DIAGNOSIS — M75100 Unspecified rotator cuff tear or rupture of unspecified shoulder, not specified as traumatic: Secondary | ICD-10-CM | POA: Insufficient documentation

## 2015-02-22 DIAGNOSIS — E079 Disorder of thyroid, unspecified: Secondary | ICD-10-CM | POA: Insufficient documentation

## 2015-02-22 DIAGNOSIS — M129 Arthropathy, unspecified: Secondary | ICD-10-CM

## 2015-02-22 DIAGNOSIS — Z885 Allergy status to narcotic agent status: Secondary | ICD-10-CM | POA: Insufficient documentation

## 2015-02-22 HISTORY — PX: SHOULDER ARTHROSCOPY WITH DISTAL CLAVICLE RESECTION: SHX5675

## 2015-02-22 HISTORY — PX: SHOULDER OPEN ROTATOR CUFF REPAIR: SHX2407

## 2015-02-22 LAB — CBC
HCT: 35.7 % — ABNORMAL LOW (ref 36.0–46.0)
HEMOGLOBIN: 11.7 g/dL — AB (ref 12.0–15.0)
MCH: 31 pg (ref 26.0–34.0)
MCHC: 32.8 g/dL (ref 30.0–36.0)
MCV: 94.4 fL (ref 78.0–100.0)
PLATELETS: 193 10*3/uL (ref 150–400)
RBC: 3.78 MIL/uL — AB (ref 3.87–5.11)
RDW: 12.7 % (ref 11.5–15.5)
WBC: 15.1 10*3/uL — AB (ref 4.0–10.5)

## 2015-02-22 LAB — CREATININE, SERUM
Creatinine, Ser: 0.72 mg/dL (ref 0.50–1.10)
GFR, EST NON AFRICAN AMERICAN: 88 mL/min — AB (ref >90)

## 2015-02-22 SURGERY — SHOULDER ARTHROSCOPY WITH DISTAL CLAVICLE RESECTION
Anesthesia: General | Site: Shoulder | Laterality: Right

## 2015-02-22 MED ORDER — LACTATED RINGERS IV SOLN
INTRAVENOUS | Status: DC
  Administered 2015-02-22: 10:00:00 via INTRAVENOUS
  Administered 2015-02-22 (×2): 1000 mL via INTRAVENOUS

## 2015-02-22 MED ORDER — CELECOXIB 100 MG PO CAPS
200.0000 mg | ORAL_CAPSULE | Freq: Two times a day (BID) | ORAL | Status: DC
  Administered 2015-02-22 – 2015-02-23 (×3): 200 mg via ORAL
  Filled 2015-02-22 (×3): qty 2

## 2015-02-22 MED ORDER — NEOSTIGMINE METHYLSULFATE 10 MG/10ML IV SOLN
INTRAVENOUS | Status: DC | PRN
  Administered 2015-02-22: 3 mg via INTRAVENOUS

## 2015-02-22 MED ORDER — METOCLOPRAMIDE HCL 10 MG PO TABS: 5.0000 mg | ORAL_TABLET | Freq: Three times a day (TID) | ORAL | Status: DC | PRN

## 2015-02-22 MED ORDER — MIDAZOLAM HCL 2 MG/2ML IJ SOLN
1.0000 mg | INTRAMUSCULAR | Status: DC | PRN
  Administered 2015-02-22: 2 mg via INTRAVENOUS
  Administered 2015-02-22: 1 mg via INTRAVENOUS
  Filled 2015-02-22: qty 2

## 2015-02-22 MED ORDER — MORPHINE SULFATE 2 MG/ML IJ SOLN
1.0000 mg | INTRAMUSCULAR | Status: DC | PRN
Start: 2015-02-22 — End: 2015-02-23

## 2015-02-22 MED ORDER — ONDANSETRON HCL 4 MG/2ML IJ SOLN
4.0000 mg | Freq: Once | INTRAMUSCULAR | Status: AC
  Administered 2015-02-22: 4 mg via INTRAVENOUS

## 2015-02-22 MED ORDER — PHENYLEPHRINE HCL 10 MG/ML IJ SOLN
INTRAMUSCULAR | Status: AC
  Filled 2015-02-22: qty 1

## 2015-02-22 MED ORDER — ACETAMINOPHEN 325 MG PO TABS: 650.0000 mg | ORAL_TABLET | Freq: Four times a day (QID) | ORAL | Status: DC | PRN

## 2015-02-22 MED ORDER — ENOXAPARIN SODIUM 30 MG/0.3ML ~~LOC~~ SOLN
30.0000 mg | SUBCUTANEOUS | Status: DC
  Administered 2015-02-23: 30 mg via SUBCUTANEOUS
  Filled 2015-02-22: qty 0.3

## 2015-02-22 MED ORDER — CHLORHEXIDINE GLUCONATE 4 % EX LIQD: 60.0000 mL | Freq: Once | CUTANEOUS | Status: DC

## 2015-02-22 MED ORDER — PHENYLEPHRINE HCL 10 MG/ML IJ SOLN
INTRAMUSCULAR | Status: DC | PRN
  Administered 2015-02-22: 100 ug via INTRAVENOUS
  Administered 2015-02-22: 50 ug via INTRAVENOUS

## 2015-02-22 MED ORDER — PROPOFOL 10 MG/ML IV BOLUS
INTRAVENOUS | Status: AC
  Filled 2015-02-22: qty 20

## 2015-02-22 MED ORDER — ALUM & MAG HYDROXIDE-SIMETH 200-200-20 MG/5ML PO SUSP: 30.0000 mL | ORAL | Status: DC | PRN

## 2015-02-22 MED ORDER — BUPIVACAINE-EPINEPHRINE 0.5% -1:200000 IJ SOLN
INTRAMUSCULAR | Status: DC | PRN
  Administered 2015-02-22: 60 mL

## 2015-02-22 MED ORDER — SODIUM CHLORIDE 0.9 % IR SOLN
Status: DC | PRN
  Administered 2015-02-22: 1000 mL

## 2015-02-22 MED ORDER — HYDROCODONE-ACETAMINOPHEN 5-325 MG PO TABS
1.0000 | ORAL_TABLET | Freq: Once | ORAL | Status: AC
  Administered 2015-02-22: 1 via ORAL

## 2015-02-22 MED ORDER — ROCURONIUM BROMIDE 100 MG/10ML IV SOLN
INTRAVENOUS | Status: DC | PRN
  Administered 2015-02-22: 10 mg via INTRAVENOUS
  Administered 2015-02-22: 30 mg via INTRAVENOUS

## 2015-02-22 MED ORDER — ACETAMINOPHEN 650 MG RE SUPP: 650.0000 mg | Freq: Four times a day (QID) | RECTAL | Status: DC | PRN

## 2015-02-22 MED ORDER — ONDANSETRON HCL 4 MG PO TABS: 4.0000 mg | ORAL_TABLET | Freq: Four times a day (QID) | ORAL | Status: DC | PRN

## 2015-02-22 MED ORDER — ONDANSETRON HCL 4 MG/2ML IJ SOLN
INTRAMUSCULAR | Status: AC
  Filled 2015-02-22: qty 2

## 2015-02-22 MED ORDER — MIDAZOLAM HCL 2 MG/2ML IJ SOLN
INTRAMUSCULAR | Status: AC
  Filled 2015-02-22: qty 2

## 2015-02-22 MED ORDER — CEFAZOLIN SODIUM 1-5 GM-% IV SOLN
1.0000 g | Freq: Four times a day (QID) | INTRAVENOUS | Status: AC
  Administered 2015-02-22 – 2015-02-23 (×3): 1 g via INTRAVENOUS
  Filled 2015-02-22 (×3): qty 50

## 2015-02-22 MED ORDER — KETOROLAC TROMETHAMINE 30 MG/ML IJ SOLN
INTRAMUSCULAR | Status: AC
  Filled 2015-02-22: qty 1

## 2015-02-22 MED ORDER — CEFAZOLIN SODIUM-DEXTROSE 2-3 GM-% IV SOLR
2.0000 g | INTRAVENOUS | Status: DC
  Administered 2015-02-22: 2 g via INTRAVENOUS

## 2015-02-22 MED ORDER — FENTANYL CITRATE 0.05 MG/ML IJ SOLN
INTRAMUSCULAR | Status: AC
  Filled 2015-02-22: qty 5

## 2015-02-22 MED ORDER — SODIUM CHLORIDE 0.9 % IV SOLN
INTRAVENOUS | Status: AC
  Administered 2015-02-22: 15:00:00 via INTRAVENOUS

## 2015-02-22 MED ORDER — EPHEDRINE SULFATE 50 MG/ML IJ SOLN
INTRAMUSCULAR | Status: DC | PRN
  Administered 2015-02-22: 15 mg via INTRAVENOUS
  Administered 2015-02-22: 5 mg via INTRAVENOUS

## 2015-02-22 MED ORDER — SODIUM CHLORIDE 0.9 % IJ SOLN
INTRAMUSCULAR | Status: AC
  Filled 2015-02-22: qty 10

## 2015-02-22 MED ORDER — DOCUSATE SODIUM 100 MG PO CAPS
100.0000 mg | ORAL_CAPSULE | Freq: Two times a day (BID) | ORAL | Status: DC
  Administered 2015-02-22 – 2015-02-23 (×3): 100 mg via ORAL
  Filled 2015-02-22 (×3): qty 1

## 2015-02-22 MED ORDER — LEVOTHYROXINE SODIUM 50 MCG PO TABS
50.0000 ug | ORAL_TABLET | Freq: Every day | ORAL | Status: DC
  Administered 2015-02-23: 50 ug via ORAL
  Filled 2015-02-22: qty 1

## 2015-02-22 MED ORDER — EPINEPHRINE HCL 1 MG/ML IJ SOLN
INTRAMUSCULAR | Status: AC
  Filled 2015-02-22: qty 5

## 2015-02-22 MED ORDER — KETOROLAC TROMETHAMINE 15 MG/ML IJ SOLN
15.0000 mg | Freq: Four times a day (QID) | INTRAMUSCULAR | Status: AC
  Administered 2015-02-22 – 2015-02-23 (×3): 15 mg via INTRAVENOUS
  Filled 2015-02-22 (×4): qty 1

## 2015-02-22 MED ORDER — METOCLOPRAMIDE HCL 5 MG/ML IJ SOLN: 5.0000 mg | Freq: Three times a day (TID) | INTRAMUSCULAR | Status: DC | PRN

## 2015-02-22 MED ORDER — ONDANSETRON HCL 4 MG/2ML IJ SOLN: 4.0000 mg | Freq: Four times a day (QID) | INTRAMUSCULAR | Status: DC | PRN

## 2015-02-22 MED ORDER — GLYCOPYRROLATE 0.2 MG/ML IJ SOLN
INTRAMUSCULAR | Status: AC
Start: 2015-02-22 — End: 2015-02-22
  Filled 2015-02-22: qty 2

## 2015-02-22 MED ORDER — FENTANYL CITRATE 0.05 MG/ML IJ SOLN
INTRAMUSCULAR | Status: AC
  Filled 2015-02-22: qty 2

## 2015-02-22 MED ORDER — BUPIVACAINE-EPINEPHRINE (PF) 0.5% -1:200000 IJ SOLN
INTRAMUSCULAR | Status: AC
  Filled 2015-02-22: qty 60

## 2015-02-22 MED ORDER — CEFAZOLIN SODIUM-DEXTROSE 2-3 GM-% IV SOLR
INTRAVENOUS | Status: AC
  Filled 2015-02-22: qty 50

## 2015-02-22 MED ORDER — HYDROCODONE-ACETAMINOPHEN 5-325 MG PO TABS
ORAL_TABLET | ORAL | Status: AC
Start: 2015-02-22 — End: 2015-02-22
  Filled 2015-02-22: qty 1

## 2015-02-22 MED ORDER — ROCURONIUM BROMIDE 50 MG/5ML IV SOLN
INTRAVENOUS | Status: AC
  Filled 2015-02-22: qty 1

## 2015-02-22 MED ORDER — NEOSTIGMINE METHYLSULFATE 10 MG/10ML IV SOLN
INTRAVENOUS | Status: AC
  Filled 2015-02-22: qty 1

## 2015-02-22 MED ORDER — MENTHOL 3 MG MT LOZG: 1.0000 | LOZENGE | OROMUCOSAL | Status: DC | PRN

## 2015-02-22 MED ORDER — SODIUM CHLORIDE 0.9 % IR SOLN
Status: DC | PRN
  Administered 2015-02-22 (×8): 3000 mL

## 2015-02-22 MED ORDER — FENTANYL CITRATE 0.05 MG/ML IJ SOLN: 25.0000 ug | INTRAMUSCULAR | Status: DC | PRN

## 2015-02-22 MED ORDER — LIDOCAINE HCL (PF) 1 % IJ SOLN
INTRAMUSCULAR | Status: AC
  Filled 2015-02-22: qty 5

## 2015-02-22 MED ORDER — METHOCARBAMOL 1000 MG/10ML IJ SOLN
500.0000 mg | Freq: Once | INTRAVENOUS | Status: AC
  Administered 2015-02-22: 500 mg via INTRAVENOUS
  Filled 2015-02-22: qty 5

## 2015-02-22 MED ORDER — DIPHENHYDRAMINE HCL 12.5 MG/5ML PO ELIX
12.5000 mg | ORAL_SOLUTION | ORAL | Status: DC | PRN
  Administered 2015-02-22 – 2015-02-23 (×4): 12.5 mg via ORAL
  Filled 2015-02-22 (×4): qty 5

## 2015-02-22 MED ORDER — LIDOCAINE HCL 1 % IJ SOLN
INTRAMUSCULAR | Status: DC | PRN
  Administered 2015-02-22: 20 mg via INTRADERMAL

## 2015-02-22 MED ORDER — ONDANSETRON HCL 4 MG/2ML IJ SOLN
INTRAMUSCULAR | Status: DC | PRN
  Administered 2015-02-22: 4 mg via INTRAVENOUS

## 2015-02-22 MED ORDER — EPINEPHRINE HCL 1 MG/ML IJ SOLN
INTRAMUSCULAR | Status: AC
  Filled 2015-02-22: qty 6

## 2015-02-22 MED ORDER — PROPOFOL 10 MG/ML IV BOLUS
INTRAVENOUS | Status: DC | PRN
  Administered 2015-02-22: 100 mg via INTRAVENOUS

## 2015-02-22 MED ORDER — HYDROCODONE-ACETAMINOPHEN 5-325 MG PO TABS
1.0000 | ORAL_TABLET | ORAL | Status: DC | PRN
  Administered 2015-02-22 – 2015-02-23 (×4): 1 via ORAL
  Filled 2015-02-22 (×5): qty 1

## 2015-02-22 MED ORDER — GLYCOPYRROLATE 0.2 MG/ML IJ SOLN
INTRAMUSCULAR | Status: AC
  Filled 2015-02-22: qty 1

## 2015-02-22 MED ORDER — FENTANYL CITRATE 0.05 MG/ML IJ SOLN
INTRAMUSCULAR | Status: DC | PRN
  Administered 2015-02-22 (×5): 50 ug via INTRAVENOUS

## 2015-02-22 MED ORDER — ONDANSETRON HCL 4 MG/2ML IJ SOLN: 4.0000 mg | Freq: Once | INTRAMUSCULAR | Status: DC | PRN

## 2015-02-22 MED ORDER — MIDAZOLAM HCL 5 MG/5ML IJ SOLN
INTRAMUSCULAR | Status: DC | PRN
  Administered 2015-02-22: 1 mg via INTRAVENOUS

## 2015-02-22 MED ORDER — KETOROLAC TROMETHAMINE 30 MG/ML IJ SOLN
30.0000 mg | Freq: Once | INTRAMUSCULAR | Status: AC
  Administered 2015-02-22: 30 mg via INTRAVENOUS

## 2015-02-22 MED ORDER — GLYCOPYRROLATE 0.2 MG/ML IJ SOLN
INTRAMUSCULAR | Status: DC | PRN
  Administered 2015-02-22: 0.6 mg via INTRAVENOUS

## 2015-02-22 MED ORDER — PHENOL 1.4 % MT LIQD: 1.0000 | OROMUCOSAL | Status: DC | PRN

## 2015-02-22 SURGICAL SUPPLY — 92 items
ANCHOR CORKSCREW 5.0 FIBERWIRE (Anchor) ×1 IMPLANT
APL SKNCLS STERI-STRIP NONHPOA (GAUZE/BANDAGES/DRESSINGS)
BAG HAMPER (MISCELLANEOUS) ×2 IMPLANT
BENZOIN TINCTURE PRP APPL 2/3 (GAUZE/BANDAGES/DRESSINGS) ×1 IMPLANT
BLADE AGGRESSIVE PLUS 4.0 (BLADE) ×2 IMPLANT
BLADE AVERAGE 25X9 (BLADE) IMPLANT
BLADE HEX COATED 2.75 (ELECTRODE) ×2 IMPLANT
BLADE SURG 15 STRL LF DISP TIS (BLADE) IMPLANT
BLADE SURG 15 STRL SS (BLADE) ×2
BLADE SURG SZ10 CARB STEEL (BLADE) ×1 IMPLANT
BNDG COHESIVE 4X5 TAN STRL (GAUZE/BANDAGES/DRESSINGS) ×2 IMPLANT
BUR 5.0 BARRELL (BURR) IMPLANT
BUR BARRELL 4.0 (BURR) ×1 IMPLANT
BUR ROUND 5.0 (BURR) IMPLANT
CANNULA DRILOCK 5.0X75 (CANNULA) ×2 IMPLANT
CANNULA DRILOCK 6.5X75 (CANNULA) ×1 IMPLANT
CANNULA DRILOCK 8.0X75 (CANNULA) IMPLANT
CHLORAPREP W/TINT 26ML (MISCELLANEOUS) ×2 IMPLANT
CLOTH BEACON ORANGE TIMEOUT ST (SAFETY) ×2 IMPLANT
COVER LIGHT HANDLE STERIS (MISCELLANEOUS) ×4 IMPLANT
COVER PROBE W GEL 5X96 (DRAPES) ×2 IMPLANT
DECANTER SPIKE VIAL GLASS SM (MISCELLANEOUS) ×4 IMPLANT
DRAPE PROXIMA HALF (DRAPES) ×2 IMPLANT
DRAPE SHOULDER BEACH CHAIR (DRAPES) ×2 IMPLANT
DRAPE U-SHAPE 47X51 STRL (DRAPES) ×2 IMPLANT
DRESSING ALLEVYN BORDER HEEL (GAUZE/BANDAGES/DRESSINGS) ×1 IMPLANT
DRSG MEPILEX BORDER 4X8 (GAUZE/BANDAGES/DRESSINGS) ×2 IMPLANT
ELECT REM PT RETURN 9FT ADLT (ELECTROSURGICAL) ×2
ELECTRODE REM PT RTRN 9FT ADLT (ELECTROSURGICAL) ×1 IMPLANT
FIBERSTICK 2 (SUTURE) IMPLANT
GAUZE SPONGE 4X4 16PLY XRAY LF (GAUZE/BANDAGES/DRESSINGS) ×2 IMPLANT
GLOVE BIOGEL M 7.0 STRL (GLOVE) ×2 IMPLANT
GLOVE BIOGEL PI IND STRL 7.0 (GLOVE) IMPLANT
GLOVE BIOGEL PI INDICATOR 7.0 (GLOVE) ×2
GLOVE SKINSENSE NS SZ8.0 LF (GLOVE) ×1
GLOVE SKINSENSE STRL SZ8.0 LF (GLOVE) ×1 IMPLANT
GLOVE SS N UNI LF 8.5 STRL (GLOVE) ×2 IMPLANT
GOWN STRL REUS W/TWL LRG LVL3 (GOWN DISPOSABLE) ×6 IMPLANT
GOWN STRL REUS W/TWL XL LVL3 (GOWN DISPOSABLE) ×2 IMPLANT
INST SET MINOR BONE (KITS) ×1 IMPLANT
IV NS IRRIG 3000ML ARTHROMATIC (IV SOLUTION) ×11 IMPLANT
KIT BLADEGUARD II DBL (SET/KITS/TRAYS/PACK) ×2 IMPLANT
KIT POSITION SHOULDER SCHLEI (MISCELLANEOUS) ×2 IMPLANT
KIT ROOM TURNOVER APOR (KITS) ×2 IMPLANT
MANIFOLD NEPTUNE II (INSTRUMENTS) ×2 IMPLANT
MARKER SKIN DUAL TIP RULER LAB (MISCELLANEOUS) ×2 IMPLANT
NDL HYPO 21X1.5 SAFETY (NEEDLE) ×1 IMPLANT
NDL MAYO 6 CRC TAPER PT (NEEDLE) IMPLANT
NDL SCORPION (NEEDLE) IMPLANT
NDL SPNL 18GX3.5 QUINCKE PK (NEEDLE) ×1 IMPLANT
NEEDLE HYPO 21X1.5 SAFETY (NEEDLE) IMPLANT
NEEDLE MAYO 6 CRC TAPER PT (NEEDLE) ×2 IMPLANT
NEEDLE SCORPION (NEEDLE) IMPLANT
NEEDLE SPNL 18GX3.5 QUINCKE PK (NEEDLE) ×2 IMPLANT
NS IRRIG 1000ML POUR BTL (IV SOLUTION) ×2 IMPLANT
PACK TOTAL JOINT (CUSTOM PROCEDURE TRAY) ×2 IMPLANT
PAD ARMBOARD 7.5X6 YLW CONV (MISCELLANEOUS) ×2 IMPLANT
PASSER SUT CAPTURE FIRST (SUTURE) ×1 IMPLANT
PENCIL HANDSWITCHING (ELECTRODE) ×1 IMPLANT
RASP SM TEAR CROSS CUT (RASP) IMPLANT
SET ARTHROSCOPY INST (INSTRUMENTS) ×2 IMPLANT
SET BASIN LINEN APH (SET/KITS/TRAYS/PACK) ×2 IMPLANT
SLING ARM LRG ADULT FOAM STRAP (SOFTGOODS) IMPLANT
SLING ARM MED ADULT FOAM STRAP (SOFTGOODS) ×1 IMPLANT
SPONGE LAP 18X18 X RAY DECT (DISPOSABLE) ×2 IMPLANT
STAPLER VISISTAT 35W (STAPLE) ×1 IMPLANT
STOCKINETTE IMPERVIOUS LG (DRAPES) ×2 IMPLANT
STRIP CLOSURE SKIN 1/2X4 (GAUZE/BANDAGES/DRESSINGS) IMPLANT
SUT BONE WAX W31G (SUTURE) IMPLANT
SUT ETHIBOND NAB OS 4 #2 30IN (SUTURE) IMPLANT
SUT ETHILON 3 0 FSL (SUTURE) ×1 IMPLANT
SUT FIBERWIRE #2 38 REV NDL BL (SUTURE) ×4
SUT FIBERWIRE #2 38 T-5 BLUE (SUTURE)
SUT LASSO 45 DEGREE (SUTURE) IMPLANT
SUT LASSO 45 DEGREE LEFT (SUTURE) IMPLANT
SUT LASSO 45D RIGHT (SUTURE) IMPLANT
SUT MON AB 0 CT1 (SUTURE) ×3 IMPLANT
SUT MON AB 2-0 CT1 36 (SUTURE) ×1 IMPLANT
SUT PROLENE 2 0 FS (SUTURE) IMPLANT
SUT PROLENE 2 0 SH 30 (SUTURE) IMPLANT
SUT PROLENE 3 0 PS 1 (SUTURE) IMPLANT
SUT VIC AB 1 CT1 27 (SUTURE)
SUT VIC AB 1 CT1 27XBRD ANTBC (SUTURE) IMPLANT
SUTURE FIBERWR #2 38 T-5 BLUE (SUTURE) IMPLANT
SUTURE FIBERWR#2 38 REV NDL BL (SUTURE) IMPLANT
SYR 30ML LL (SYRINGE) ×2 IMPLANT
SYR BULB IRRIGATION 50ML (SYRINGE) ×4 IMPLANT
TOWEL OR 17X26 4PK STRL BLUE (TOWEL DISPOSABLE) ×2 IMPLANT
TUBING ARTHROSCOPY INFLOW/OUT (IRRIGATION / IRRIGATOR) ×2 IMPLANT
WAND 90 DEG TURBOVAC W/CORD (SURGICAL WAND) ×2 IMPLANT
YANKAUER SUCT 12FT TUBE ARGYLE (SUCTIONS) ×1 IMPLANT
YANKAUER SUCT BULB TIP 10FT TU (MISCELLANEOUS) ×5 IMPLANT

## 2015-02-22 NOTE — Op Note (Signed)
02/22/2015  11:53 AM  PATIENT:  Tami Estrada  64 y.o. female  PRE-OPERATIVE DIAGNOSIS:  right shoulder arthritis AC joint  POST-OPERATIVE DIAGNOSIS:  right shoulder torn rotator cuff, partial torn biceps tendon, left shoulder joint synovitis, AC joint arthritis  PROCEDURE:  Procedure(s): RIGHT SHOULDER ARTHROSCOPY WITH DISTAL CLAVICLE RESECTION (Right) RIGHT SHOULDER ARTHROSCOPY DEBRIDEMENT OPEN ROTATOR CUFF REPAIR  FINDINGS:  SHOULDER JOINT SYNOVITIS, BICEPS PARTIAL TEAR < 50% ROTATOR CUFF TEAR SUPRASPINATUS  AC JOINT ARTHROSIS   SURGEON:  Surgeon(s) and Role:    * Carole Civil, MD - Primary  PHYSICIAN ASSISTANT:   ASSISTANTS: BETTY ASHLEY   ANESTHESIA:   general  EBL:  Total I/O In: 1900 [I.V.:1900] Out: -   BLOOD ADMINISTERED:none  DRAINS: none   LOCAL MEDICATIONS USED:  MARCAINE   , Amount: 60 ml and OTHER EPI  SPECIMEN:  No Specimen  DISPOSITION OF SPECIMEN:  N/A  COUNTS:  YES  TOURNIQUET:  * No tourniquets in log *  DICTATION: .Dragon Dictation  PLAN OF CARE: Admit for overnight observation  PATIENT DISPOSITION:  PACU - hemodynamically stable.   Delay start of Pharmacological VTE agent (>24hrs) due to surgical blood loss or risk of bleeding: not applicable.  Surgical dictation details  The patient was identified in the preop holding area and the right shoulder was marked as a surgical site  The patient was taken to the operating room for general anesthesia and we also started 2 g of Ancef IV. She was placed in the modified beachchair position with the Schlein positioner  After sterile prep and drape with ChloraPrep the timeout procedure was completed.  A standard posterior portal was established and the scope was placed into the glenohumeral joint. The joint was evaluated in circumferential systematic manner until the entire joint was viewed.  We did find a significant amount of synovitis throughout the joint including a partial tear of  the biceps tendon a torn rotator cuff at its insertion without retraction.  After establishing an anterolateral portal, a shaver was placed into the joint and a debridement was performed including to bring the biceps tendon. After debridement the tendon still had greater than 50% integrity and was not released.  The scope was then placed in the subacromial space through a separate portal and a bursectomy was performed, lateral portal was established acromioplasty was performed and distal clavicle was excised arthroscopically.  A suture was placed in the rotator cuff and a mini open rotator cuff repair was performed. After dividing the skin and subcutaneous tissue the deltoid was split in line with its fibers. Deep retractors were placed the suture was brought through the incision. A corkscrew 5.5 anchor was placed and 2 sutures were passed to the rotator cuff anchoring down. There was no retraction in the tear and it was easily repaired back to its bed.  Additional sutures were placed to close the rotator interval.  The wound was then irrigated and closed with 0 Monocryl 2-0 Monocryl and staples, portals were closed with 3-0 nylon suture.  Sub acromial space was injected with Marcaine with epinephrine  Sterile dressing was applied along with a sling and the patient was extubated taken recovery room in stable condition
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
## 2015-02-22 NOTE — Anesthesia Preprocedure Evaluation (Addendum)
Anesthesia Evaluation  Patient identified by MRN, date of birth, ID band Patient awake    Reviewed: Allergy & Precautions, NPO status , Patient's Chart, lab work & pertinent test results  Airway Mallampati: II  TM Distance: <3 FB Neck ROM: Full    Dental  (+) Edentulous Upper, Partial Lower   Pulmonary neg pulmonary ROS,  breath sounds clear to auscultation        Cardiovascular negative cardio ROS  Rhythm:Regular Rate:Normal     Neuro/Psych    GI/Hepatic   Endo/Other  Hypothyroidism   Renal/GU      Musculoskeletal   Abdominal   Peds  Hematology   Anesthesia Other Findings   Reproductive/Obstetrics                             Anesthesia Physical Anesthesia Plan  ASA: II  Anesthesia Plan: General   Post-op Pain Management:    Induction: Intravenous  Airway Management Planned: Oral ETT  Additional Equipment:   Intra-op Plan:   Post-operative Plan: Extubation in OR  Informed Consent: I have reviewed the patients History and Physical, chart, labs and discussed the procedure including the risks, benefits and alternatives for the proposed anesthesia with the patient or authorized representative who has indicated his/her understanding and acceptance.     Plan Discussed with:   Anesthesia Plan Comments:        Anesthesia Quick Evaluation

## 2015-02-22 NOTE — Brief Op Note (Signed)
02/22/2015  11:53 AM  PATIENT:  Tami Estrada  64 y.o. female  PRE-OPERATIVE DIAGNOSIS:  right shoulder arthritis AC joint  POST-OPERATIVE DIAGNOSIS:  right shoulder torn rotator cuff, partial torn biceps tendon, left shoulder joint synovitis, AC joint arthritis  PROCEDURE:  Procedure(s): RIGHT SHOULDER ARTHROSCOPY WITH DISTAL CLAVICLE RESECTION (Right) RIGHT SHOULDER ARTHROSCOPY DEBRIDEMENT OPEN ROTATOR CUFF REPAIR  FINDINGS:  SHOULDER JOINT SYNOVITIS, BICEPS PARTIAL TEAR < 50% ROTATOR CUFF TEAR SUPRASPINATUS  AC JOINT ARTHROSIS   SURGEON:  Surgeon(s) and Role:    * Carole Civil, MD - Primary  PHYSICIAN ASSISTANT:   ASSISTANTS: BETTY ASHLEY   ANESTHESIA:   general  EBL:  Total I/O In: 1900 [I.V.:1900] Out: -   BLOOD ADMINISTERED:none  DRAINS: none   LOCAL MEDICATIONS USED:  MARCAINE   , Amount: 60 ml and OTHER EPI  SPECIMEN:  No Specimen  DISPOSITION OF SPECIMEN:  N/A  COUNTS:  YES  TOURNIQUET:  * No tourniquets in log *  DICTATION: .Dragon Dictation  PLAN OF CARE: Admit for overnight observation  PATIENT DISPOSITION:  PACU - hemodynamically stable.   Delay start of Pharmacological VTE agent (>24hrs) due to surgical blood loss or risk of bleeding: not applicable.  Surgical dictation details  The patient was identified in the preop holding area and the right shoulder was marked as a surgical site  The patient was taken to the operating room for general anesthesia and we also started 2 g of Ancef IV. She was placed in the modified beachchair position with the Schlein positioner  After sterile prep and drape with ChloraPrep the timeout procedure was completed.  A standard posterior portal was established and the scope was placed into the glenohumeral joint. The joint was evaluated in circumferential systematic manner until the entire joint was viewed.  We did find a significant amount of synovitis throughout the joint including a partial tear of  the biceps tendon a torn rotator cuff at its insertion without retraction.  After establishing an anterolateral portal, a shaver was placed into the joint and a debridement was performed including to bring the biceps tendon. After debridement the tendon still had greater than 50% integrity and was not released.  The scope was then placed in the subacromial space through a separate portal and a bursectomy was performed, lateral portal was established acromioplasty was performed and distal clavicle was excised arthroscopically.  A suture was placed in the rotator cuff and a mini open rotator cuff repair was performed. After dividing the skin and subcutaneous tissue the deltoid was split in line with its fibers. Deep retractors were placed the suture was brought through the incision. A corkscrew 5.5 anchor was placed and 2 sutures were passed to the rotator cuff anchoring down. There was no retraction in the tear and it was easily repaired back to its bed.  Additional sutures were placed to close the rotator interval.  The wound was then irrigated and closed with 0 Monocryl 2-0 Monocryl and staples, portals were closed with 3-0 nylon suture.  Sub acromial space was injected with Marcaine with epinephrine  Sterile dressing was applied along with a sling and the patient was extubated taken recovery room in stable condition

## 2015-02-22 NOTE — Progress Notes (Signed)
Notified by patient that she has an allergy to hydrocodone when she requested PO pain medication. Patient stated that she was worried about taking it because she thought last time it made it hard for her to breath but she couldn't remember. Pharmacy was notified. MD was notified. Patient had received a dose of hydrocodone-acetaminophen in the PACU and had had no complications. MD ordered to give the hydrocodone-acetaminophen with benadryl. Patient received both hydrocodone-acetaminophen and benadryl and had no complications or complaints.

## 2015-02-22 NOTE — Anesthesia Procedure Notes (Signed)
Procedure Name: Intubation Date/Time: 02/22/2015 9:19 AM Performed by: Charmaine Downs Pre-anesthesia Checklist: Suction available, Patient being monitored, Emergency Drugs available and Patient identified Patient Re-evaluated:Patient Re-evaluated prior to inductionOxygen Delivery Method: Circle system utilized Preoxygenation: Pre-oxygenation with 100% oxygen Intubation Type: IV induction Ventilation: Mask ventilation without difficulty and Oral airway inserted - appropriate to patient size Laryngoscope Size: Mac and 3 Grade View: Grade I Tube type: Oral Tube size: 7.0 mm Number of attempts: 1 Airway Equipment and Method: Stylet Placement Confirmation: ETT inserted through vocal cords under direct vision,  positive ETCO2 and breath sounds checked- equal and bilateral Secured at: 22 cm Tube secured with: Tape Dental Injury: Teeth and Oropharynx as per pre-operative assessment

## 2015-02-22 NOTE — Transfer of Care (Signed)
2Immediate Anesthesia Transfer of Care Note  Patient: Tami Estrada  Procedure(s) Performed: Procedure(s): RIGHT SHOULDER ARTHROSCOPY WITH DISTAL CLAVICLE RESECTION (Right)  Patient Location: PACU  Anesthesia Type:General  Level of Consciousness: awake and alert   Airway & Oxygen Therapy: Patient Spontanous Breathing and Patient connected to nasal cannula oxygen  Post-op Assessment: Report given to RN and Post -op Vital signs reviewed and stable  Post vital signs: Reviewed and stable  Last Vitals:  Filed Vitals:   02/22/15 0900  BP: 112/65  Temp:   Resp: 28    Complications: No apparent anesthesia complications

## 2015-02-22 NOTE — Anesthesia Postprocedure Evaluation (Signed)
  Anesthesia Post-op Note  Patient: Tami Estrada  Procedure(s) Performed: Procedure(s): RIGHT SHOULDER ARTHROSCOPY WITH DISTAL CLAVICLE RESECTION, DEBRIDEMENT (Right) ROTATOR CUFF REPAIR SHOULDER OPEN (Right)  Patient Location: PACU  Anesthesia Type:General  Level of Consciousness: oriented, sedated and patient cooperative  Airway and Oxygen Therapy: Patient Spontanous Breathing and Patient connected to nasal cannula oxygen  Post-op Pain: none  Post-op Assessment: Post-op Vital signs reviewed, Patient's Cardiovascular Status Stable, Respiratory Function Stable, Patent Airway, No signs of Nausea or vomiting and Pain level controlled  Post-op Vital Signs: Reviewed and stable  Last Vitals:  Filed Vitals:   02/22/15 1230  BP: 178/81  Pulse: 91  Temp:   Resp: 24    Complications: No apparent anesthesia complications

## 2015-02-22 NOTE — Progress Notes (Signed)
This patient was scheduled to go home today however upon reassessment, she is having difficulties with arousal secondary to medication we use for pain which was quite extensive as well.  She will be placed on observation status with the scheduled discharge home for tomorrow.

## 2015-02-23 DIAGNOSIS — M75101 Unspecified rotator cuff tear or rupture of right shoulder, not specified as traumatic: Secondary | ICD-10-CM | POA: Diagnosis not present

## 2015-02-23 MED ORDER — HYDROCODONE-ACETAMINOPHEN 5-325 MG PO TABS: 1.0000 | ORAL_TABLET | Freq: Four times a day (QID) | ORAL | Status: DC | PRN

## 2015-02-23 MED ORDER — DIPHENHYDRAMINE HCL 25 MG PO TABS: 25.0000 mg | ORAL_TABLET | Freq: Four times a day (QID) | ORAL | Status: DC | PRN

## 2015-02-23 NOTE — Discharge Summary (Signed)
Physician Discharge Summary  Patient ID: Tami Estrada MRN: [id number] DOB/AGE: [birth year] 66 y.o.  Patient ID: Tami Estrada MRN: [id number] DOB/AGE: March 01, 1951 64 y.o.  Admit date: 02/22/2015 Discharge date: 02/23/2015  Admission Diagnoses: [facility] JOINT ARTHROSIS  Discharge Diagnoses:  TORN ROTATOR CUFF RIGHT SHOULDER  AC JOINT ARTHROSIS TORN BICEPS  /PARTIAL PROXIMAL   POST PAIN UNCONTROLLED' POST OP SOMNOLENCE   Active Problems:   Rotator cuff tear   Arthritis, shoulder region   Nontraumatic rupture of right proximal biceps tendon   Discharged Condition: good  Hospital Course: SURGERY 3.4.2016 RIGHT SHOULDER ARTHROSCOPY AND OPEN CUFF REPAIR, PACU SOMNOLENCE, ADMIT AS OVERNIGHT OBSERVATION, DID WELL        Discharge Exam: Blood pressure 110/47, pulse 62, temperature 98 F (36.7 C), temperature source Oral, resp. rate 16, height 5\' 2"  (1.575 m), weight 112 lb (50.803 kg), SpO2 100 %. General appearance: alert, cooperative and no distress Extremities: RIGHT SHOULDER IN SLING  Pulses: 2+ and symmetric Skin: mobility and turgor normal, temperature normal and vascularity normal Neurologic: Grossly normal  Disposition: 01-Home or Self Care     Medication List    ASK your doctor about these medications        levothyroxine 50 MCG tablet  Commonly known as:  SYNTHROID, LEVOTHROID  Take 50 mcg by mouth daily.           Follow-up Information    Follow up with Arther Abbott, MD.   Specialties:  Orthopedic Surgery, Radiology   Contact information:   [address] [address] [phone]          Signed: Arther Abbott 02/23/2015, 10:23 AM    Disposition: 01-Home or Self Care  Discharge Instructions    Call MD / Call 911    Complete by:  As directed   If you experience chest pain or shortness of breath, CALL 911 and be transported to the hospital emergency room.  If you develope a fever above 101 F, pus (white drainage) or  increased drainage or redness at the wound, or calf pain, call your surgeon's office.     Constipation Prevention    Complete by:  As directed   Drink plenty of fluids.  Prune juice may be helpful.  You may use a stool softener, such as Colace (over the counter) 100 mg twice a day.  Use MiraLax (over the counter) for constipation as needed.     Diet - low sodium heart healthy    Complete by:  As directed      Discharge instructions    Complete by:  As directed   Wear sling   Remove only to get dressed  No shower x 2 weeks     Driving restrictions    Complete by:  As directed   No driving for 3 weeks     Increase activity slowly as tolerated    Complete by:  As directed      Lifting restrictions    Complete by:  As directed   No lifting for 6 weeks            Medication List    TAKE these medications        diphenhydrAMINE 25 MG tablet  Commonly known as:  BENADRYL  Take 1 tablet (25 mg total) by mouth every 6 (six) hours as needed.     HYDROcodone-acetaminophen 5-325 MG per tablet  Commonly known as:  NORCO  Take 1 tablet by mouth every 6 (  six) hours as needed for moderate pain.     levothyroxine 50 MCG tablet  Commonly known as:  SYNTHROID, LEVOTHROID  Take 50 mcg by mouth daily.           Follow-up Information    Follow up with Arther Abbott, MD.   Specialties:  Orthopedic Surgery, Radiology   Contact information:   [address] [place] [address] [phone]       Signed: Arther Abbott 02/23/2015, 10:56 AM

## 2015-02-23 NOTE — Discharge Planning (Signed)
Client iv removed and ready to be DC.  VSS, client in given pain meds and benedryl just before leaving.  DC papers and scripts given. All signed. Client told of fu with Ortho and all medications and instructions reviewed. Client wheeled to car by PCT. Family agreed to take client home via car.

## 2015-02-25 ENCOUNTER — Encounter: Payer: Self-pay | Admitting: Orthopedic Surgery

## 2015-02-25 ENCOUNTER — Ambulatory Visit (INDEPENDENT_AMBULATORY_CARE_PROVIDER_SITE_OTHER): Payer: Self-pay | Admitting: Orthopedic Surgery

## 2015-02-25 VITALS — BP 151/88 | Ht 62.0 in | Wt 112.0 lb

## 2015-02-25 DIAGNOSIS — Z9889 Other specified postprocedural states: Secondary | ICD-10-CM

## 2015-02-25 MED ORDER — HYDROCODONE-ACETAMINOPHEN 5-325 MG PO TABS: 1.0000 | ORAL_TABLET | ORAL | Status: DC | PRN

## 2015-02-25 NOTE — Progress Notes (Signed)
Postop visit #1 Chief Complaint  Patient presents with  . Follow-up    post op 1, Right shoulder arthroscopy w/distal clavicle resection, DOS 02/22/15     Procedure PRE-OPERATIVE DIAGNOSIS:  right shoulder arthritis AC joint  POST-OPERATIVE DIAGNOSIS:  right shoulder torn rotator cuff, partial torn biceps tendon, left shoulder joint synovitis, AC joint arthritis  PROCEDURE:  Procedure(s): RIGHT SHOULDER ARTHROSCOPY WITH DISTAL CLAVICLE RESECTION (Right) RIGHT SHOULDER ARTHROSCOPY DEBRIDEMENT OPEN ROTATOR CUFF REPAIR  FINDINGS:  SHOULDER JOINT SYNOVITIS, BICEPS PARTIAL TEAR < 50% ROTATOR CUFF TEAR SUPRASPINATUS   AC JOINT ARTHROSIS   Current status She stayed overnight observation for somnolence she's doing well she's taking her pain medicine 5 mg of hydrocodone that's holding her well she is in a sling  Start therapy this week refill medication  Come back on the 17th to get the staples taken out to the sutures out today all wounds look clean dry and intact without signs of infection  Start passive range of motion only from week 0 through 6

## 2015-02-25 NOTE — Addendum Note (Signed)
Addendum  created 02/25/15 0743 by Mickel Baas, CRNA   Modules edited: Anesthesia Medication Administration

## 2015-02-25 NOTE — Patient Instructions (Signed)
CALL TO ARRANGE THERAPY AT [facility] THERAPY DEPT

## 2015-03-04 ENCOUNTER — Encounter (HOSPITAL_COMMUNITY): Payer: Self-pay

## 2015-03-04 ENCOUNTER — Ambulatory Visit (HOSPITAL_COMMUNITY): Payer: Medicare HMO | Attending: Orthopedic Surgery

## 2015-03-04 DIAGNOSIS — Z9889 Other specified postprocedural states: Secondary | ICD-10-CM

## 2015-03-04 DIAGNOSIS — M6281 Muscle weakness (generalized): Secondary | ICD-10-CM | POA: Diagnosis not present

## 2015-03-04 DIAGNOSIS — Z5189 Encounter for other specified aftercare: Secondary | ICD-10-CM | POA: Insufficient documentation

## 2015-03-04 DIAGNOSIS — R29898 Other symptoms and signs involving the musculoskeletal system: Secondary | ICD-10-CM

## 2015-03-04 DIAGNOSIS — M25611 Stiffness of right shoulder, not elsewhere classified: Secondary | ICD-10-CM

## 2015-03-04 DIAGNOSIS — M25511 Pain in right shoulder: Secondary | ICD-10-CM

## 2015-03-04 NOTE — Therapy (Addendum)
[place], [place], [address] Phone: [phone]   Fax:  [phone]  Occupational Therapy Evaluation  Patient Details  Name: Tami Estrada MRN: [id number] Date of Birth: Nov 15, 1951 Referring Provider:  Carole Civil, MD  Encounter Date: 03/04/2015      OT End of Session - 03/04/15 1225    Visit Number 1   Number of Visits 24   Date for OT Re-Evaluation 04/29/15  Mini reasess 04/01/15   Authorization Type [employer] HMO   Authorization Time Period before 10th visit   Authorization - Visit Number 1   Authorization - Number of Visits 10   OT Start Time [phone]   OT Stop Time 1015   OT Time Calculation (min) 41 min   Activity Tolerance Patient tolerated treatment well   Behavior During Therapy [facility] for tasks assessed/performed      Past Medical History  Diagnosis Date  . History of [facility] spotted fever   . Thyroid disease   . GERD (gastroesophageal reflux disease)     Esophogeal dysmotility- 2010  . Hypothyroidism     Past Surgical History  Procedure Laterality Date  . Thyroid surgery      thyroidectomy  . Neck surgery      cervical disc  . Back surgery      lumbar disc  . Shoulder arthroscopy with distal clavicle resection Right 02/22/2015    Procedure: RIGHT SHOULDER ARTHROSCOPY WITH DISTAL CLAVICLE RESECTION, DEBRIDEMENT;  Surgeon: Carole Civil, MD;  Location: [facility] ORS;  Service: Orthopedics;  Laterality: Right;  . Shoulder open rotator cuff repair Right 02/22/2015    Procedure: ROTATOR CUFF REPAIR SHOULDER OPEN;  Surgeon: Carole Civil, MD;  Location: [facility] ORS;  Service: Orthopedics;  Laterality: Right;    There were no vitals filed for this visit.  Visit Diagnosis:  S/P arthroscopy of shoulder - Plan: Ot plan of care cert/re-cert  Pain in right shoulder - Plan: Ot plan of care cert/re-cert  Decreased range of motion of shoulder, right - Plan: Ot plan of care  cert/re-cert  Shoulder weakness - Plan: Ot plan of care cert/re-cert      Subjective Assessment - 03/04/15 1153    Symptoms S: I want to be able to get better and use this arm normally.    Pertinent History Patient is a 64 y/o female s/p right shoulder arthroscopy returning to therapy clinic following sx. Pt was previously attending therapy for right shoulder pain. Sx was performed on 02/22/15. Pt is to wear a sling 24/7 and is able to remove for bathing, dressing, and exercises. Dr. Aline Brochure has referred patient to occupational therapy for evaluation and treatment.    Special Tests FOTO score: 31/100    Patient Stated Goals To increase strength and return to using right arm normally.            [facility] OT Assessment - 03/04/15 0001    Assessment   Diagnosis Arthroscopy of right shoulder   Onset Date 02/22/15   Assessment 03/07/15 - Dr. Aline Brochure   Prior Therapy [facility] for rotator cuff syndrome   Precautions   Precautions Shoulder   Type of Shoulder Precautions PROM Right shoulder for 6 weeks (04/05/15)   Shoulder Interventions Shoulder sling/immobilizer   Restrictions   Weight Bearing Restrictions Yes   Balance Screen   Has the patient fallen in the past 6 months No   Has the patient had a decrease in activity  level because of a fear of falling?  No   Is the patient reluctant to leave their home because of a fear of falling?  No   Home  Environment   Family/patient expects to be discharged to: Private residence   Living Arrangements Alone   Available Help at Discharge Family   Prior Function   Level of Independence Independent with basic ADLs   Vocation Retired   Leisure yardwork   ADL   ADL comments Pt is unable to complete any  Daily tasks requiring use of right extremity.    Mobility   Mobility Status Independent   Written Expression   Dominant Hand Right   Vision - History   Baseline Vision Wears glasses only for reading   Cognition   Overall Cognitive Status  Within Functional Limits for tasks assessed   Palpation   Palpation Min fascial restrictions in right medial deltoid and trapezius and scapular region.    AROM   Overall AROM  Unable to assess;Due to precautions   Overall AROM Comments --   Right/Left Shoulder --   PROM   Overall PROM Comments Assessed supine. IR/ER adducted   Right/Left Shoulder Right   Right Shoulder Flexion 66 Degrees   Right Shoulder ABduction 50 Degrees   Right Shoulder Internal Rotation 90 Degrees   Right Shoulder External Rotation 0 Degrees   Strength   Overall Strength Unable to assess;Due to precautions                  OT Treatments/Exercises (OP) - 03/04/15 1243    Exercises   Exercises Shoulder   Shoulder Exercises: Supine   Protraction PROM;5 reps   Horizontal ABduction PROM;5 reps   External Rotation PROM;5 reps   Internal Rotation PROM;5 reps   Flexion PROM;5 reps   ABduction PROM;5 reps   Manual Therapy   Manual Therapy Myofascial release   Myofascial Release Myofascial release and manual stretching to right upper arm, trapezius, and scapularis region. to decrease fascial restrctions and increase joint mobility in a pain free zone.                OT Education - 03/04/15 1225    Education provided Yes   Education Details Table slides, pendulum, wrist and elbow AROM exercises   Person(s) Educated Patient   Methods Explanation;Demonstration;Handout   Comprehension Verbalized understanding;Returned demonstration          OT Short Term Goals - 03/04/15 1230    OT SHORT TERM GOAL #1   Title Patient will be educated on HEP.    Time 4   Period Weeks   Status New   OT SHORT TERM GOAL #2   Title Patient will decrease pain to 5/10 during daily activities.    Time 4   Period Weeks   Status New   OT SHORT TERM GOAL #3   Title Patient will decrease facial restrictions to trace amount.    Time 4   Period Weeks   Status New   OT SHORT TERM GOAL #4   Title Patient will  increase strength to 3-/5 to increase ability to pick up objects when completing yardwork.   Time 4   Period Weeks   Status On-going   OT SHORT TERM GOAL #5   Title Patient will increase PROM to [facility] to increase ability to donn/doff shirts easier.    Time 4   Period Weeks   Status New  OT Long Term Goals - March 23, 2015 1233    OT LONG TERM GOAL #1   Title Patient will return to highest level of functioning/independence during all daily and leisure tasks.    Time 12   Period Weeks   Status New   OT LONG TERM GOAL #2   Title Patient will decrease pain to 2/10 or less during daily activities.    Time 12   Period Weeks   Status New   OT LONG TERM GOAL #3   Title Patient will decrease fascial restrictions to zero in right UE.   Time 12   Period Weeks   Status New   OT LONG TERM GOAL #4   Title Patient will increase strength to 5/5 to increase ability to use leaf blower.    Time 12   Period Weeks   Status New   OT LONG TERM GOAL #5   Title Patient will increase AROM to [facility] to increase ability to complete daily tasks such as yardwork with less difficulty.    Time 12   Period Weeks   Status New               Plan - 03-23-15 1227    Clinical Impression Statement A: Patient is a 64 y/o female s/p right arthroscopy of shoulder causing increased fascial restrctions and pain and decreased strength and ROM resulting in difficulty compelting daily tasks using right arm as dominant extremity.    Pt will benefit from skilled therapeutic intervention in order to improve on the following deficits (Retired) Decreased range of motion;Increased fascial restricitons;Pain;Impaired UE functional use;Decreased strength   Rehab Potential Excellent   OT Frequency 2x / week   OT Duration 12 weeks   OT Treatment/Interventions Self-care/ADL training;Therapeutic exercise;Patient/family education;Manual Therapy;Neuromuscular education;Ultrasound;Iontophoresis;Therapeutic activities;DME  and/or AE instruction;Cryotherapy;Electrical Stimulation;Passive range of motion;Moist Heat   Plan P: Patient will benefti from skilled OT services to increase functional use of Right UE as dominant extremity. Treatment Plan: myofascial release, muscle energy technique, PROM, AAROM, AROM, general strengthening, modalities as needed for pain.    Consulted and Agree with Plan of Care Patient          G-Codes - 2015-03-23 1235    Functional Assessment Tool Used FOTO score: 31/100 (69% impaired)   Functional Limitation Carrying, moving and handling objects   Carrying, Moving and Handling Objects Current Status (I3704) At least 60 percent but less than 80 percent impaired, limited or restricted   Carrying, Moving and Handling Objects Goal Status (U8891) At least 1 percent but less than 20 percent impaired, limited or restricted      Problem List Patient Active Problem List   Diagnosis Date Noted  . History of arthroscopy of right shoulder 02/25/2015  . Rotator cuff tear   . Arthritis, shoulder region   . Nontraumatic rupture of right proximal biceps tendon   . Common cold 12/20/2012  . Back pain 12/20/2012  . Dysuria 12/20/2012  . Left knee pain 03/11/2012  . Hypothyroidism 03/11/2012  . Post-menopausal 03/11/2012    Ailene Ravel, OTR/L,CBIS  [phone]  2015-03-23, 12:45 PM  [place] [address] [place], [place], [address] Phone: [phone]   Fax:  [phone]

## 2015-03-04 NOTE — Patient Instructions (Signed)
TOWEL SLIDES COMPLETE FOR 1-3 MINUTES, 3-5 TIMES PER DAY  SHOULDER: Flexion On Table   Place hands on table, elbows straight. Move hips away from body. Press hands down into table. Hold ___ seconds. ___ reps per set, ___ sets per day, ___ days per week  Abduction (Passive)   With arm out to side, resting on table, lower head toward arm, keeping trunk away from table. Hold ____ seconds. Repeat ____ times. Do ____ sessions per day.  Copyright  VHI. All rights reserved.     Internal Rotation (Assistive)   Seated with elbow bent at right angle and held against side, slide arm on table surface in an inward arc. Repeat ____ times. Do ____ sessions per day. Activity: Use this motion to brush crumbs off the table.  Copyright  VHI. All rights reserved.    COMPLETE PENDULUM EXERCISES FOR 30 SECONDS TO A MINUTE EACH, 3-5 TIMES PER DAY. ROM: Pendulum (Side-to-Side)   Deje que el brazo derecho se balancee suavemente de lado a lado mientras oscila el cuerpo de [place]. Repita ____ veces por rutina. Realice ____ rutinas por sesin. Realice ____ sesiones por da.  http://orth.exer.us/792   Copyright  VHI. All rights reserved.  Pendulum Forward/Back   Bend forward 90 at waist, using table for support. Rock body forward and back to swing arm. Repeat ____ times. Do ____ sessions per day.  Copyright  VHI. All rights reserved.  Pendulum Circular   Bend forward 90 at waist, leaning on table for support. Rock body in a circular pattern to move arm clockwise ____ times then counterclockwise ____ times. Do ____ sessions per day.  Copyright  VHI. All rights reserved.  AROM: Wrist Extension   With right palm down, bend wrist up. Repeat 10____ times per set. Do ____ sets per session. Do __3__ sessions per day.    AROM: Wrist Flexion   With right palm up, bend wrist up. Repeat ___10_ times per set. Do ____ sets per session. Do __3__ sessions per day.  Copyright  VHI.  All rights reserved.   AROM: Forearm Pronation / Supination   With right arm in handshake position, slowly rotate palm down until stretch is felt. Relax. Then rotate palm up until stretch is felt. Repeat __10__ times per set. Do ____ sets per session. Do __3__ sessions per day.   Flexion (Active)   Palm up, rest elbow on other hand. Bend as far as possible. Hold _5___ seconds. Repeat _10___ times. Do ____ sessions per day.  Copyright  VHI. All rights reserved.

## 2015-03-06 ENCOUNTER — Ambulatory Visit (HOSPITAL_COMMUNITY): Payer: Medicare HMO | Admitting: Specialist

## 2015-03-06 DIAGNOSIS — R29898 Other symptoms and signs involving the musculoskeletal system: Secondary | ICD-10-CM

## 2015-03-06 DIAGNOSIS — Z5189 Encounter for other specified aftercare: Secondary | ICD-10-CM | POA: Diagnosis not present

## 2015-03-06 DIAGNOSIS — M25511 Pain in right shoulder: Secondary | ICD-10-CM

## 2015-03-06 DIAGNOSIS — M25611 Stiffness of right shoulder, not elsewhere classified: Secondary | ICD-10-CM

## 2015-03-06 NOTE — Therapy (Signed)
[place], [place], [address] Phone: [phone]   Fax:  [phone]  Occupational Therapy Treatment  Patient Details  Name: Tami Estrada MRN: [id number] Date of Birth: 29-May-1951 Referring Provider:  Carole Civil, MD  Encounter Date: 03/06/2015      OT End of Session - 03/06/15 1325    Visit Number 2   Number of Visits 24   Date for OT Re-Evaluation 04/29/15  mini reassess 04/01/15   Authorization Type [employer] HMO   Authorization Time Period before 10th visit   Authorization - Visit Number 2   Authorization - Number of Visits 10   OT Start Time 1100   OT Stop Time 1140   OT Time Calculation (min) 40 min   Activity Tolerance Patient tolerated treatment well   Behavior During Therapy [facility] for tasks assessed/performed      Past Medical History  Diagnosis Date  . History of [facility] spotted fever   . Thyroid disease   . GERD (gastroesophageal reflux disease)     Esophogeal dysmotility- 2010  . Hypothyroidism     Past Surgical History  Procedure Laterality Date  . Thyroid surgery      thyroidectomy  . Neck surgery      cervical disc  . Back surgery      lumbar disc  . Shoulder arthroscopy with distal clavicle resection Right 02/22/2015    Procedure: RIGHT SHOULDER ARTHROSCOPY WITH DISTAL CLAVICLE RESECTION, DEBRIDEMENT;  Surgeon: Carole Civil, MD;  Location: [facility];  Service: Orthopedics;  Laterality: Right;  . Shoulder open rotator cuff repair Right 02/22/2015    Procedure: ROTATOR CUFF REPAIR SHOULDER OPEN;  Surgeon: Carole Civil, MD;  Location: [facility];  Service: Orthopedics;  Laterality: Right;    There were no vitals filed for this visit.  Visit Diagnosis:  Pain in right shoulder  Decreased range of motion of shoulder, right  Shoulder weakness      Subjective Assessment - 03/06/15 1101    Symptoms S;  I wear the sling all of the time, except when getting  dressed and exercising.    Limitations PROM through 04/05/15   Currently in Pain? Yes   Pain Score 2    Pain Location Shoulder   Pain Orientation Right   Pain Descriptors / Indicators Aching   Pain Type Surgical pain            [facility] OT Assessment - 03/06/15 0001    Assessment   Diagnosis Arthroscopy of right shoulder   Onset Date 02/22/15   Precautions   Precautions Shoulder   Type of Shoulder Precautions PROM Right shoulder for 6 weeks (04/05/15)                OT Treatments/Exercises (OP) - 03/06/15 0001    Exercises   Exercises Shoulder   Shoulder Exercises: Supine   Protraction PROM;5 reps   Horizontal ABduction PROM;5 reps   External Rotation PROM;5 reps   Internal Rotation PROM;5 reps   Flexion PROM;5 reps   ABduction PROM;5 reps   Shoulder Exercises: Seated   Elevation AROM;10 reps   Extension AROM;10 reps   Row AROM;10 reps   Other Seated Exercises elbow flexion extension, supination, pronation, wrist flexion extension 10 times AROM   Shoulder Exercises: Isometric Strengthening   Flexion Supine;3X3"   Extension Supine;3X3"   External Rotation Supine;3X3"   Internal Rotation Supine;3X3"   ABduction Supine;3X3"   ADduction Supine;3X3"  Shoulder Exercises: Stretch   Table Stretch - Flexion --  15 times   Table Stretch - Abduction --  15 times   Table Stretch - ABduction Limitations modified HEP for palm down and leaning into table to stretch arm   Table Stretch - External Rotation Limitations 15 times   Manual Therapy   Manual Therapy Myofascial release   Myofascial Release Myofascial release and manual stretching to right upper arm, scapular, trapezius, biceps, and anterior and posterior shoulder region to decrease pain and improve pain free mobility.                  OT Education - 03/06/15 1325    Education provided Yes   Education Details modified abduction table slide for palm down and leaning into table to move arm.   Person(s)  Educated Patient   Methods Explanation;Demonstration;Handout;Verbal cues   Comprehension Verbalized understanding;Returned demonstration          OT Short Term Goals - 03/04/15 1230    OT SHORT TERM GOAL #1   Title Patient will be educated on HEP.    Time 4   Period Weeks   Status New   OT SHORT TERM GOAL #2   Title Patient will decrease pain to 5/10 during daily activities.    Time 4   Period Weeks   Status New   OT SHORT TERM GOAL #3   Title Patient will decrease facial restrictions to trace amount.    Time 4   Period Weeks   Status New   OT SHORT TERM GOAL #4   Title Patient will increase strength to 3-/5 to increase ability to pick up objects when completing yardwork.   Time 4   Period Weeks   Status On-going   OT SHORT TERM GOAL #5   Title Patient will increase PROM to [facility] to increase ability to donn/doff shirts easier.    Time 4   Period Weeks   Status New           OT Long Term Goals - 03/04/15 1233    OT LONG TERM GOAL #1   Title Patient will return to highest level of functioning/independence during all daily and leisure tasks.    Time 12   Period Weeks   Status New   OT LONG TERM GOAL #2   Title Patient will decrease pain to 2/10 or less during daily activities.    Time 12   Period Weeks   Status New   OT LONG TERM GOAL #3   Title Patient will decrease fascial restrictions to zero in right UE.   Time 12   Period Weeks   Status New   OT LONG TERM GOAL #4   Title Patient will increase strength to 5/5 to increase ability to use leaf blower.    Time 12   Period Weeks   Status New   OT LONG TERM GOAL #5   Title Patient will increase AROM to [facility] to increase ability to complete daily tasks such as yardwork with less difficulty.    Time 12   Period Weeks   Status New               Plan - 03/06/15 1326    Clinical Impression Statement A:  Patient had increased pain with table stretch for abduction, with modifications, patient can complete  with less pain and improved stretch.    Plan P:  Improve PROM by 10 degrees, add ball stretches.  Problem List Patient Active Problem List   Diagnosis Date Noted  . History of arthroscopy of right shoulder 02/25/2015  . Rotator cuff tear   . Arthritis, shoulder region   . Nontraumatic rupture of right proximal biceps tendon   . Common cold 12/20/2012  . Back pain 12/20/2012  . Dysuria 12/20/2012  . Left knee pain 03/11/2012  . Hypothyroidism 03/11/2012  . Post-menopausal 03/11/2012    Vangie Bicker, OTR/L [phone]  03/06/2015, 1:27 PM  [place] [address] [place], [place], [address] Phone: [phone]   Fax:  [phone]

## 2015-03-06 NOTE — Patient Instructions (Signed)
SHOULDER: Flexion On Table   Place hands on table, elbows straight. Move hips away from body. Press hands down into table. Hold ___ seconds. ___ reps per set, ___ sets per day, ___ days per week  Abduction (Passive)   With arm out to side, resting on table, lower head toward arm, keeping trunk away from table. Hold ____ seconds. Repeat ____ times. Do ____ sessions per day.  Copyright  VHI. All rights reserved.     Internal Rotation (Assistive)   Seated with elbow bent at right angle and held against side, slide arm on table surface in an inward arc. Repeat ____ times. Do ____ sessions per day. Activity: Use this motion to brush crumbs off the table.  Copyright  VHI. All rights reserved.   

## 2015-03-07 ENCOUNTER — Ambulatory Visit (INDEPENDENT_AMBULATORY_CARE_PROVIDER_SITE_OTHER): Payer: Self-pay | Admitting: Orthopedic Surgery

## 2015-03-07 VITALS — BP 149/77 | Ht 62.0 in | Wt 112.0 lb

## 2015-03-07 DIAGNOSIS — Z9889 Other specified postprocedural states: Secondary | ICD-10-CM

## 2015-03-07 NOTE — Progress Notes (Signed)
Post op  Chief Complaint  Patient presents with  . Follow-up    post op 2, Right shoulder, staple removal, DOS 02/22/15 SARS W/ CLAVICLE RESECTION    All incisions look clean   Staples were removed    continue therapy. Remove sling well in the home. Follow-up 4 weeks. Pain well controlled with oral analgesics which she takes 1 per day

## 2015-03-07 NOTE — Patient Instructions (Signed)
Continue therapy

## 2015-03-11 ENCOUNTER — Ambulatory Visit (HOSPITAL_COMMUNITY): Payer: Medicare HMO | Admitting: Specialist

## 2015-03-11 DIAGNOSIS — R29898 Other symptoms and signs involving the musculoskeletal system: Secondary | ICD-10-CM

## 2015-03-11 DIAGNOSIS — Z5189 Encounter for other specified aftercare: Secondary | ICD-10-CM | POA: Diagnosis not present

## 2015-03-11 DIAGNOSIS — M25611 Stiffness of right shoulder, not elsewhere classified: Secondary | ICD-10-CM

## 2015-03-11 DIAGNOSIS — M25511 Pain in right shoulder: Secondary | ICD-10-CM

## 2015-03-11 NOTE — Therapy (Signed)
[place], [place], [address] Phone: [phone]   Fax:  [phone]  Occupational Therapy Treatment  Patient Details  Name: Tami Estrada MRN: [id number] Date of Birth: October 17, 1951 Referring Provider:  Carole Civil, MD  Encounter Date: 03/11/2015      OT End of Session - 03/11/15 1553    Visit Number 3   Number of Visits 24   Date for OT Re-Evaluation 04/29/15  mini reassess on 4/11   Authorization Type [employer] HMO   Authorization Time Period before 10th visit   Authorization - Visit Number 3   Authorization - Number of Visits 10   OT Start Time 1521   OT Stop Time 1555   OT Time Calculation (min) 34 min   Activity Tolerance Patient tolerated treatment well   Behavior During Therapy [facility] for tasks assessed/performed      Past Medical History  Diagnosis Date  . History of [facility] spotted fever   . Thyroid disease   . GERD (gastroesophageal reflux disease)     Esophogeal dysmotility- 2010  . Hypothyroidism     Past Surgical History  Procedure Laterality Date  . Thyroid surgery      thyroidectomy  . Neck surgery      cervical disc  . Back surgery      lumbar disc  . Shoulder arthroscopy with distal clavicle resection Right 02/22/2015    Procedure: RIGHT SHOULDER ARTHROSCOPY WITH DISTAL CLAVICLE RESECTION, DEBRIDEMENT;  Surgeon: Carole Civil, MD;  Location: [facility];  Service: Orthopedics;  Laterality: Right;  . Shoulder open rotator cuff repair Right 02/22/2015    Procedure: ROTATOR CUFF REPAIR SHOULDER OPEN;  Surgeon: Carole Civil, MD;  Location: [facility];  Service: Orthopedics;  Laterality: Right;    There were no vitals filed for this visit.  Visit Diagnosis:  Pain in right shoulder  Decreased range of motion of shoulder, right  Shoulder weakness      Subjective Assessment - 03/11/15 1521    Symptoms S:  I have been doing good with the exercises at home.     Limitations PROM through 04/05/15   Currently in Pain? Yes   Pain Score 3    Pain Location Shoulder   Pain Orientation Right   Pain Descriptors / Indicators Aching   Pain Type Acute pain            [facility] OT Assessment - 03/11/15 0001    Assessment   Diagnosis Arthroscopy of right shoulder   Onset Date 02/22/15   Precautions   Precautions Shoulder   Type of Shoulder Precautions PROM Right shoulder for 6 weeks (04/05/15)                OT Treatments/Exercises (OP) - 03/11/15 0001    Exercises   Exercises Shoulder   Shoulder Exercises: Supine   Protraction PROM;5 reps   Horizontal ABduction PROM;5 reps   External Rotation PROM;5 reps   Internal Rotation PROM;5 reps   Flexion PROM;5 reps   ABduction PROM;5 reps   Shoulder Exercises: Seated   Elevation AROM;15 reps   Extension AROM;15 reps  palms forward   Row AROM;15 reps   Other Seated Exercises elbow flexion extension, supination, pronation, wrist flexion extension 10 times AROM   Other Seated Exercises thoracic flexion extension 15 times    Shoulder Exercises: Isometric Strengthening   Flexion Supine;3X5"   Extension Supine;3X5"   External Rotation Supine;3X5"   Internal  Rotation Supine;3X5"   ABduction Supine;3X5"   ADduction Supine;3X5"   Manual Therapy   Manual Therapy Myofascial release   Myofascial Release Myofascial release and manual stretching to right upper arm, scapular, trapezius, biceps, and anterior and posterior shoulder region to decrease pain and improve pain free mobility.                    OT Short Term Goals - 03/04/15 1230    OT SHORT TERM GOAL #1   Title Patient will be educated on HEP.    Time 4   Period Weeks   Status New   OT SHORT TERM GOAL #2   Title Patient will decrease pain to 5/10 during daily activities.    Time 4   Period Weeks   Status New   OT SHORT TERM GOAL #3   Title Patient will decrease facial restrictions to trace amount.    Time 4   Period  Weeks   Status New   OT SHORT TERM GOAL #4   Title Patient will increase strength to 3-/5 to increase ability to pick up objects when completing yardwork.   Time 4   Period Weeks   Status On-going   OT SHORT TERM GOAL #5   Title Patient will increase PROM to [facility] to increase ability to donn/doff shirts easier.    Time 4   Period Weeks   Status New           OT Long Term Goals - 03/04/15 1233    OT LONG TERM GOAL #1   Title Patient will return to highest level of functioning/independence during all daily and leisure tasks.    Time 12   Period Weeks   Status New   OT LONG TERM GOAL #2   Title Patient will decrease pain to 2/10 or less during daily activities.    Time 12   Period Weeks   Status New   OT LONG TERM GOAL #3   Title Patient will decrease fascial restrictions to zero in right UE.   Time 12   Period Weeks   Status New   OT LONG TERM GOAL #4   Title Patient will increase strength to 5/5 to increase ability to use leaf blower.    Time 12   Period Weeks   Status New   OT LONG TERM GOAL #5   Title Patient will increase AROM to [facility] to increase ability to complete daily tasks such as yardwork with less difficulty.    Time 12   Period Weeks   Status New               Plan - 03/11/15 1557    Clinical Impression Statement A:  Patient with improved PROM and less guearding all ranges this date.     Plan P:  issue isometrics as HEP.        Problem List Patient Active Problem List   Diagnosis Date Noted  . History of arthroscopy of right shoulder 02/25/2015  . Rotator cuff tear   . Arthritis, shoulder region   . Nontraumatic rupture of right proximal biceps tendon   . Common cold 12/20/2012  . Back pain 12/20/2012  . Dysuria 12/20/2012  . Left knee pain 03/11/2012  . Hypothyroidism 03/11/2012  . Post-menopausal 03/11/2012    Vangie Bicker, OTR/L [phone]  03/11/2015, 4:00 PM  [place] [address] [place], [place], [address] Phone: [phone]  Fax:  [phone]

## 2015-03-12 ENCOUNTER — Telehealth: Payer: Self-pay | Admitting: Orthopedic Surgery

## 2015-03-12 NOTE — Telephone Encounter (Signed)
Patient is aware 

## 2015-03-12 NOTE — Telephone Encounter (Signed)
Patients daughter is calling Leeroy Bock) asking if Ms Bradshaw can get in home therapy due to having a $40 copay each visit that she dont have and she is also having transportation issues getting to PT in town, please advise?

## 2015-03-12 NOTE — Telephone Encounter (Signed)
Insurance will not pay for home health therapy as she is not homebound

## 2015-03-13 ENCOUNTER — Ambulatory Visit (HOSPITAL_COMMUNITY): Payer: Medicare HMO | Admitting: Specialist

## 2015-03-13 DIAGNOSIS — Z5189 Encounter for other specified aftercare: Secondary | ICD-10-CM | POA: Diagnosis not present

## 2015-03-13 DIAGNOSIS — M25511 Pain in right shoulder: Secondary | ICD-10-CM

## 2015-03-13 DIAGNOSIS — M25611 Stiffness of right shoulder, not elsewhere classified: Secondary | ICD-10-CM

## 2015-03-13 NOTE — Therapy (Signed)
[place], [place], [address] Phone: [phone]   Fax:  [phone]  Occupational Therapy Treatment  Patient Details  Name: Tami Estrada MRN: [id number] Date of Birth: [birth year] Referring Provider:  Carole Civil, MD  Encounter Date: 03/13/2015      OT End of Session - 03/13/15 1057    Visit Number 4   Number of Visits 24   Date for OT Re-Evaluation 04/29/15  mini reassess on 03/30/15   Authorization Type [employer] Medicare HMO   Authorization Time Period before 10th visit   Authorization - Visit Number 4   Authorization - Number of Visits 10   OT Start Time [phone]   OT Stop Time 1020   OT Time Calculation (min) 41 min   Activity Tolerance Patient tolerated treatment well   Behavior During Therapy [facility] for tasks assessed/performed      Past Medical History  Diagnosis Date  . History of [facility] spotted fever   . Thyroid disease   . GERD (gastroesophageal reflux disease)     Esophogeal dysmotility- 2010  . Hypothyroidism     Past Surgical History  Procedure Laterality Date  . Thyroid surgery      thyroidectomy  . Neck surgery      cervical disc  . Back surgery      lumbar disc  . Shoulder arthroscopy with distal clavicle resection Right 02/22/2015    Procedure: RIGHT SHOULDER ARTHROSCOPY WITH DISTAL CLAVICLE RESECTION, DEBRIDEMENT;  Surgeon: Carole Civil, MD;  Location: [facility];  Service: Orthopedics;  Laterality: Right;  . Shoulder open rotator cuff repair Right 02/22/2015    Procedure: ROTATOR CUFF REPAIR SHOULDER OPEN;  Surgeon: Carole Civil, MD;  Location: [facility];  Service: Orthopedics;  Laterality: Right;    There were no vitals filed for this visit.  Visit Diagnosis:  Pain in right shoulder  Decreased range of motion of shoulder, right      Subjective Assessment - 03/13/15 1055    Symptoms S:  I can only come one time a week now because of my copay.   Limitations PROM  through 04/05/15   Currently in Pain? Yes   Pain Score 3    Pain Location Shoulder   Pain Orientation Right   Pain Descriptors / Indicators Aching   Pain Type Acute pain            [facility] OT Assessment - 03/13/15 0001    Assessment   Diagnosis Arthroscopy of right shoulder   Onset Date 02/22/15   Precautions   Precautions Shoulder   Type of Shoulder Precautions PROM Right shoulder for 6 weeks (04/05/15)                OT Treatments/Exercises (OP) - 03/13/15 0001    Exercises   Exercises Shoulder   Shoulder Exercises: Supine   Protraction PROM;5 reps   Horizontal ABduction PROM;5 reps   External Rotation PROM;5 reps   Internal Rotation PROM;5 reps   Flexion PROM;5 reps   ABduction PROM;5 reps   Other Supine Exercises bridges 15 times   Shoulder Exercises: Seated   Elevation AROM;15 reps   Extension AROM;15 reps   Row AROM;15 reps   Shoulder Exercises: Isometric Strengthening   Flexion Supine;3X5"   Extension Supine;3X5"   External Rotation Supine;3X5"   Internal Rotation Supine;3X5"   ABduction Supine;3X5"   ADduction Supine;3X5"   Manual Therapy   Manual Therapy Myofascial release   Myofascial  Release Myofascial release and manual stretching to right upper arm, scapular, trapezius, biceps, and anterior and posterior shoulder region to decrease pain and improve pain free mobility.                  OT Education - 03/13/15 1056    Education provided Yes   Education Details isometric shoulder strengthening, elevation, extension, row   Person(s) Educated Patient   Methods Explanation;Demonstration;Handout;Verbal cues   Comprehension Verbalized understanding;Returned demonstration          OT Short Term Goals - 03/04/15 1230    OT SHORT TERM GOAL #1   Title Patient will be educated on HEP.    Time 4   Period Weeks   Status New   OT SHORT TERM GOAL #2   Title Patient will decrease pain to 5/10 during daily activities.    Time 4   Period  Weeks   Status New   OT SHORT TERM GOAL #3   Title Patient will decrease facial restrictions to trace amount.    Time 4   Period Weeks   Status New   OT SHORT TERM GOAL #4   Title Patient will increase strength to 3-/5 to increase ability to pick up objects when completing yardwork.   Time 4   Period Weeks   Status On-going   OT SHORT TERM GOAL #5   Title Patient will increase PROM to [facility] to increase ability to donn/doff shirts easier.    Time 4   Period Weeks   Status New           OT Long Term Goals - 03/04/15 1233    OT LONG TERM GOAL #1   Title Patient will return to highest level of functioning/independence during all daily and leisure tasks.    Time 12   Period Weeks   Status New   OT LONG TERM GOAL #2   Title Patient will decrease pain to 2/10 or less during daily activities.    Time 12   Period Weeks   Status New   OT LONG TERM GOAL #3   Title Patient will decrease fascial restrictions to zero in right UE.   Time 12   Period Weeks   Status New   OT LONG TERM GOAL #4   Title Patient will increase strength to 5/5 to increase ability to use leaf blower.    Time 12   Period Weeks   Status New   OT LONG TERM GOAL #5   Title Patient will increase AROM to [facility] to increase ability to complete daily tasks such as yardwork with less difficulty.    Time 12   Period Weeks   Status New               Plan - 03/13/15 1058    Clinical Impression Statement A: continuing to improve PROM in supine.  decreasing frequency to 1 time per week due to copays.   Plan P:  Follow up on HEP.         Problem List Patient Active Problem List   Diagnosis Date Noted  . History of arthroscopy of right shoulder 02/25/2015  . Rotator cuff tear   . Arthritis, shoulder region   . Nontraumatic rupture of right proximal biceps tendon   . Common cold 12/20/2012  . Back pain 12/20/2012  . Dysuria 12/20/2012  . Left knee pain 03/11/2012  . Hypothyroidism 03/11/2012  .  Post-menopausal 03/11/2012    Vangie Bicker,  OTR/L [phone]  03/13/2015, 11:00 AM  [place], [place], [address] Phone: [phone]   Fax:  [phone]

## 2015-03-13 NOTE — Patient Instructions (Signed)
Questions call [phone]   Complete each exercise 5 times, holding 5-10 seconds each   Strengthening: Isometric Flexion  Using wall for resistance, press right fist into ball using light pressure. Hold ____ seconds. Repeat ____ times per set. Do ____ sets per session. Do ____ sessions per day.  SHOULDER: Abduction (Isometric)  Use wall as resistance. Press arm against pillow. Keep elbow straight. Hold ___ seconds. ___ reps per set, ___ sets per day, ___ days per week  Extension (Isometric)  Place left bent elbow and back of arm against wall. Press elbow against wall. Hold ____ seconds. Repeat ____ times. Do ____ sessions per day.  Internal Rotation (Isometric)  Place palm of right fist against door frame, with elbow bent. Press fist against door frame. Hold ____ seconds. Repeat ____ times. Do ____ sessions per day.  External Rotation (Isometric)  Place back of left fist against door frame, with elbow bent. Press fist against door frame. Hold ____ seconds. Repeat ____ times. Do ____ sessions per day.  Copyright  VHI. All rights reserved.    Complete 10-15 times each, 3-4 times per day  Elevation (Active)   Shrug shoulders up, breathing in. Relax, breathing out. Repeat ____ times. Do ____ sessions per day.  Copyright  VHI. All rights reserved.  Scapular Retraction (Standing)   With arms at sides, pinch shoulder blades together. Repeat ____ times per set. Do ____ sets per session. Do ____ sessions per day.  http://orth.exer.us/945   Copyright  VHI. All rights reserved.  Extension (Active)   Bring arm back as far as possible. For a greater challenge, do this while lying down on stomach. Repeat ____ times. Do ____ sessions per day.  Copyright  VHI. All rights reserved.

## 2015-03-15 ENCOUNTER — Encounter (HOSPITAL_COMMUNITY): Payer: Medicare HMO | Admitting: Specialist

## 2015-03-18 ENCOUNTER — Encounter (HOSPITAL_COMMUNITY): Payer: Medicare HMO

## 2015-03-20 ENCOUNTER — Ambulatory Visit (HOSPITAL_COMMUNITY): Payer: Medicare HMO

## 2015-03-20 ENCOUNTER — Encounter (HOSPITAL_COMMUNITY): Payer: Self-pay

## 2015-03-20 DIAGNOSIS — M6281 Muscle weakness (generalized): Secondary | ICD-10-CM

## 2015-03-20 DIAGNOSIS — Z5189 Encounter for other specified aftercare: Secondary | ICD-10-CM | POA: Diagnosis not present

## 2015-03-20 DIAGNOSIS — M25611 Stiffness of right shoulder, not elsewhere classified: Secondary | ICD-10-CM

## 2015-03-20 DIAGNOSIS — M25511 Pain in right shoulder: Secondary | ICD-10-CM

## 2015-03-20 DIAGNOSIS — R29898 Other symptoms and signs involving the musculoskeletal system: Secondary | ICD-10-CM

## 2015-03-20 NOTE — Therapy (Signed)
[place], [place], [address] Phone: [phone]   Fax:  [phone]  Occupational Therapy Treatment  Patient Details  Name: Tami Estrada MRN: [id number] Date of Birth: 1951-10-04 Referring Provider:  Carole Civil, MD  Encounter Date: 03/20/2015      OT End of Session - 03/20/15 1033    Visit Number 5   Number of Visits 24   Date for OT Re-Evaluation 04/29/15  Mini-reassessment 03/30/15   Authorization Type [employer] HMO   Authorization Time Period before 10th visit   Authorization - Visit Number 4   Authorization - Number of Visits 10   OT Start Time 0930   OT Stop Time 1015   OT Time Calculation (min) 45 min   Activity Tolerance Patient tolerated treatment well   Behavior During Therapy [facility] for tasks assessed/performed      Past Medical History  Diagnosis Date  . History of [facility] spotted fever   . Thyroid disease   . GERD (gastroesophageal reflux disease)     Esophogeal dysmotility- 2010  . Hypothyroidism     Past Surgical History  Procedure Laterality Date  . Thyroid surgery      thyroidectomy  . Neck surgery      cervical disc  . Back surgery      lumbar disc  . Shoulder arthroscopy with distal clavicle resection Right 02/22/2015    Procedure: RIGHT SHOULDER ARTHROSCOPY WITH DISTAL CLAVICLE RESECTION, DEBRIDEMENT;  Surgeon: Carole Civil, MD;  Location: [facility];  Service: Orthopedics;  Laterality: Right;  . Shoulder open rotator cuff repair Right 02/22/2015    Procedure: ROTATOR CUFF REPAIR SHOULDER OPEN;  Surgeon: Carole Civil, MD;  Location: [facility];  Service: Orthopedics;  Laterality: Right;    There were no vitals filed for this visit.  Visit Diagnosis:  Pain in right shoulder  Decreased range of motion of shoulder, right  Shoulder weakness  Muscle weakness (generalized)      Subjective Assessment - 03/20/15 1020    Symptoms S: Pt stated "I do my  exercises around 4 times a day at home.  I can tell they are helping."   Currently in Pain? Yes   Pain Score 3    Pain Location Shoulder   Pain Orientation Right            [facility] OT Assessment - 03/20/15 0952    Assessment   Diagnosis Arthroscopy of right shoulder   Onset Date 02/22/15   Precautions   Precautions Shoulder   Type of Shoulder Precautions PROM Right shoulder for 6 weeks (04/05/15)                OT Treatments/Exercises (OP) - 03/20/15 0953    Exercises   Exercises Shoulder   Shoulder Exercises: Supine   Protraction PROM;5 reps   Horizontal ABduction PROM;5 reps   External Rotation PROM;5 reps   Internal Rotation PROM;5 reps   Flexion PROM;5 reps   ABduction PROM;5 reps   Other Supine Exercises bridges 15 times   Shoulder Exercises: Seated   Elevation AROM;15 reps   Extension AROM;15 reps   Row AROM;15 reps   Shoulder Exercises: Isometric Strengthening   Flexion Supine;3X5"   Extension Supine;3X5"   External Rotation Supine;3X5"   Internal Rotation Supine;3X5"   ABduction Supine;3X5"   ADduction Supine;3X5"   Manual Therapy   Manual Therapy Myofascial release   Myofascial Release Myofascial release and manual stretching to right  upper arm, scapular, trapezius, biceps, and anterior and posterior shoulder region to decrease pain and improve pain free mobility.                    OT Short Term Goals - 03/20/15 1033    OT SHORT TERM GOAL #1   Title Patient will be educated on HEP.    Time 4   Period Weeks   Status On-going   OT SHORT TERM GOAL #2   Title Patient will decrease pain to 5/10 during daily activities.    Time 4   Period Weeks   Status On-going   OT SHORT TERM GOAL #3   Title Patient will decrease facial restrictions to trace amount.    Time 4   Period Weeks   Status On-going   OT SHORT TERM GOAL #4   Title Patient will increase strength to 3-/5 to increase ability to pick up objects when completing yardwork.    Time 4   Period Weeks   Status On-going   OT SHORT TERM GOAL #5   Title Patient will increase PROM to [facility] to increase ability to donn/doff shirts easier.    Time 4   Period Weeks   Status On-going           OT Long Term Goals - 03/20/15 1034    OT LONG TERM GOAL #1   Title Patient will return to highest level of functioning/independence during all daily and leisure tasks.    Time 12   Period Weeks   Status On-going   OT LONG TERM GOAL #2   Title Patient will decrease pain to 2/10 or less during daily activities.    Time 12   Period Weeks   Status On-going   OT LONG TERM GOAL #3   Title Patient will decrease fascial restrictions to zero in right UE.   Time 12   Period Weeks   Status On-going   OT LONG TERM GOAL #4   Title Patient will increase strength to 5/5 to increase ability to use leaf blower.    Time 12   Period Weeks   Status On-going   OT LONG TERM GOAL #5   Title Patient will increase AROM to [facility] to increase ability to complete daily tasks such as yardwork with less difficulty.    Time 12   Period Weeks   Status On-going               Plan - 03/20/15 1026    Clinical Impression Statement A: Pt PROM is supine is continuing to improve and pt isn't experiencing pain when going through stretches.   Plan P: Add isometric exercises to HEP.  Add muscle energy technique during passive stretching to increase ROM.        Problem List Patient Active Problem List   Diagnosis Date Noted  . History of arthroscopy of right shoulder 02/25/2015  . Rotator cuff tear   . Arthritis, shoulder region   . Nontraumatic rupture of right proximal biceps tendon   . Common cold 12/20/2012  . Back pain 12/20/2012  . Dysuria 12/20/2012  . Left knee pain 03/11/2012  . Hypothyroidism 03/11/2012  . Post-menopausal 03/11/2012    Elba Barman, OTA Student [phone] 03/20/2015, 10:55 AM  [place], [place], [address] Phone: [phone]   Fax:  [phone]

## 2015-03-27 ENCOUNTER — Ambulatory Visit (HOSPITAL_COMMUNITY): Payer: Medicare HMO | Attending: Orthopedic Surgery | Admitting: Specialist

## 2015-03-27 DIAGNOSIS — Z5189 Encounter for other specified aftercare: Secondary | ICD-10-CM | POA: Diagnosis not present

## 2015-03-27 DIAGNOSIS — M25511 Pain in right shoulder: Secondary | ICD-10-CM | POA: Insufficient documentation

## 2015-03-27 DIAGNOSIS — M6281 Muscle weakness (generalized): Secondary | ICD-10-CM | POA: Diagnosis not present

## 2015-03-27 DIAGNOSIS — M25611 Stiffness of right shoulder, not elsewhere classified: Secondary | ICD-10-CM

## 2015-03-27 NOTE — Therapy (Signed)
[place] [address] [place], [place], [address] Phone: [phone]   Fax:  [phone]  Occupational Therapy Treatment  Patient Details  Name: Tami Estrada MRN: [id number] Date of Birth: May 07, 1951 Referring Provider:  Carole Civil, MD  Encounter Date: 03/27/2015      OT End of Session - 03/27/15 1059    Visit Number 6   Number of Visits 24   Date for OT Re-Evaluation 04/29/15  mini reassess on 4/9   Authorization Type [employer] HMO   Authorization Time Period before 10th visit   Authorization - Visit Number 5   Authorization - Number of Visits 10   OT Start Time [id number]   OT Stop Time 1015   OT Time Calculation (min) 38 min   Activity Tolerance Patient tolerated treatment well   Behavior During Therapy [facility] for tasks assessed/performed      Past Medical History  Diagnosis Date  . History of [facility] spotted fever   . Thyroid disease   . GERD (gastroesophageal reflux disease)     Esophogeal dysmotility- 2010  . Hypothyroidism     Past Surgical History  Procedure Laterality Date  . Thyroid surgery      thyroidectomy  . Neck surgery      cervical disc  . Back surgery      lumbar disc  . Shoulder arthroscopy with distal clavicle resection Right 02/22/2015    Procedure: RIGHT SHOULDER ARTHROSCOPY WITH DISTAL CLAVICLE RESECTION, DEBRIDEMENT;  Surgeon: Carole Civil, MD;  Location: [facility];  Service: Orthopedics;  Laterality: Right;  . Shoulder open rotator cuff repair Right 02/22/2015    Procedure: ROTATOR CUFF REPAIR SHOULDER OPEN;  Surgeon: Carole Civil, MD;  Location: [facility];  Service: Orthopedics;  Laterality: Right;    There were no vitals filed for this visit.  Visit Diagnosis:  Pain in right shoulder  Decreased range of motion of shoulder, right      Subjective Assessment - 03/27/15 0938    Subjective  S:  "I want a really good workout when I come here since I am only coming one time  per week."   Limitations PROM through 04/05/15   Currently in Pain? No/denies   Pain Score 0-No pain            [facility] OT Assessment - 03/27/15 0001    Assessment   Diagnosis Arthroscopy of right shoulder   Onset Date 02/22/15   Precautions   Precautions Shoulder   Type of Shoulder Precautions PROM Right shoulder for 6 weeks (04/05/15)                OT Treatments/Exercises (OP) - 03/27/15 0001    Exercises   Exercises Shoulder   Shoulder Exercises: Supine   Protraction PROM;10 reps   Horizontal ABduction PROM;10 reps   External Rotation PROM;10 reps   Internal Rotation PROM;10 reps   Flexion PROM;10 reps   ABduction PROM;10 reps   Shoulder Exercises: Seated   Elevation AROM;15 reps   Extension AROM;15 reps   Row AROM;15 reps   Shoulder Exercises: Pulleys   Flexion 1 minute   Shoulder Exercises: Therapy Ball   Flexion 20 reps   ABduction 20 reps   Shoulder Exercises: Isometric Strengthening   Flexion Supine  1 X 30"   Extension Supine  1 X 30"   External Rotation Supine  1 X 30"   Internal Rotation Supine  1 X 30"   ABduction  Supine  1 X 30"   ADduction Supine  1 X 30"   Manual Therapy   Manual Therapy Myofascial release   Myofascial Release Myofascial release and manual stretching to right upper arm, scapular, trapezius, biceps, and anterior and posterior shoulder region to decrease pain and improve pain free mobility.                    OT Short Term Goals - 03/20/15 1033    OT SHORT TERM GOAL #1   Title Patient will be educated on HEP.    Time 4   Period Weeks   Status On-going   OT SHORT TERM GOAL #2   Title Patient will decrease pain to 5/10 during daily activities.    Time 4   Period Weeks   Status On-going   OT SHORT TERM GOAL #3   Title Patient will decrease facial restrictions to trace amount.    Time 4   Period Weeks   Status On-going   OT SHORT TERM GOAL #4   Title Patient will increase strength to 3-/5 to increase  ability to pick up objects when completing yardwork.   Time 4   Period Weeks   Status On-going   OT SHORT TERM GOAL #5   Title Patient will increase PROM to [facility] to increase ability to donn/doff shirts easier.    Time 4   Period Weeks   Status On-going           OT Long Term Goals - 03/20/15 1034    OT LONG TERM GOAL #1   Title Patient will return to highest level of functioning/independence during all daily and leisure tasks.    Time 12   Period Weeks   Status On-going   OT LONG TERM GOAL #2   Title Patient will decrease pain to 2/10 or less during daily activities.    Time 12   Period Weeks   Status On-going   OT LONG TERM GOAL #3   Title Patient will decrease fascial restrictions to zero in right UE.   Time 12   Period Weeks   Status On-going   OT LONG TERM GOAL #4   Title Patient will increase strength to 5/5 to increase ability to use leaf blower.    Time 12   Period Weeks   Status On-going   OT LONG TERM GOAL #5   Title Patient will increase AROM to [facility] to increase ability to complete daily tasks such as yardwork with less difficulty.    Time 12   Period Weeks   Status On-going               Plan - 03/27/15 1100    Clinical Impression Statement A:  Patient demonstrated WFL PROM in supine flexion and abduction this date.     Plan P:  Mini reassessment.  Add abduction to pulleys.        Problem List Patient Active Problem List   Diagnosis Date Noted  . History of arthroscopy of right shoulder 02/25/2015  . Rotator cuff tear   . Arthritis, shoulder region   . Nontraumatic rupture of right proximal biceps tendon   . Common cold 12/20/2012  . Back pain 12/20/2012  . Dysuria 12/20/2012  . Left knee pain 03/11/2012  . Hypothyroidism 03/11/2012  . Post-menopausal 03/11/2012    Vangie Bicker, OTR/L [phone]  03/27/2015, 11:01 AM  [place], [place],  [place] Phone:  [phone]   Fax:  [phone]

## 2015-03-28 DIAGNOSIS — Z5189 Encounter for other specified aftercare: Secondary | ICD-10-CM | POA: Diagnosis not present

## 2015-04-03 ENCOUNTER — Ambulatory Visit (HOSPITAL_COMMUNITY): Payer: Medicare HMO | Admitting: Specialist

## 2015-04-03 DIAGNOSIS — M25511 Pain in right shoulder: Secondary | ICD-10-CM

## 2015-04-03 DIAGNOSIS — R29898 Other symptoms and signs involving the musculoskeletal system: Secondary | ICD-10-CM

## 2015-04-03 DIAGNOSIS — Z5189 Encounter for other specified aftercare: Secondary | ICD-10-CM | POA: Diagnosis not present

## 2015-04-03 DIAGNOSIS — M25611 Stiffness of right shoulder, not elsewhere classified: Secondary | ICD-10-CM

## 2015-04-03 NOTE — Therapy (Signed)
[place], [place], [address] Phone: [phone]   Fax:  [phone]  Occupational Therapy Treatment  Patient Details  Name: Tami Estrada MRN: [id number] Date of Birth: 31-May-1951 Referring Provider:  Carole Civil, MD  Encounter Date: 04/03/2015      OT End of Session - 04/03/15 1100    Visit Number 7   Number of Visits 24   Date for OT Re-Evaluation 04/29/15   Authorization Type [employer] HMO   Authorization Time Period before 17th visit   Authorization - Visit Number 7   Authorization - Number of Visits 10   OT Start Time [phone]   OT Stop Time 1020   OT Time Calculation (min) 44 min   Activity Tolerance Patient tolerated treatment well   Behavior During Therapy [facility] for tasks assessed/performed      Past Medical History  Diagnosis Date  . History of [facility] spotted fever   . Thyroid disease   . GERD (gastroesophageal reflux disease)     Esophogeal dysmotility- 2010  . Hypothyroidism     Past Surgical History  Procedure Laterality Date  . Thyroid surgery      thyroidectomy  . Neck surgery      cervical disc  . Back surgery      lumbar disc  . Shoulder arthroscopy with distal clavicle resection Right 02/22/2015    Procedure: RIGHT SHOULDER ARTHROSCOPY WITH DISTAL CLAVICLE RESECTION, DEBRIDEMENT;  Surgeon: Carole Civil, MD;  Location: [facility];  Service: Orthopedics;  Laterality: Right;  . Shoulder open rotator cuff repair Right 02/22/2015    Procedure: ROTATOR CUFF REPAIR SHOULDER OPEN;  Surgeon: Carole Civil, MD;  Location: [facility];  Service: Orthopedics;  Laterality: Right;    There were no vitals filed for this visit.  Visit Diagnosis:  Pain in right shoulder  Decreased range of motion of shoulder, right  Shoulder weakness      Subjective Assessment - 04/03/15 0937    Subjective  S:  I know I am getting better.   Limitations PROM through 04/05/15   Pain Score 1     Pain Location Arm   Pain Orientation Lower;Right   Pain Descriptors / Indicators Aching   Pain Type Acute pain            [facility] OT Assessment - 04/03/15 0001    Assessment   Diagnosis Arthroscopy of right shoulder   Onset Date 02/22/15   Precautions   Precautions Shoulder   Type of Shoulder Precautions PROM Right shoulder for 6 weeks (04/05/15)   ADL   ADL comments Patient reports increased independence and ease with dressing and bathing.  Increased use of right arm with activities at waist height.  Per protocol, unable to complete activities above waist height.   Observation/Other Assessments   Focus on Therapeutic Outcomes (FOTO)  FOTO is 25% and was 31% - may be due to misunderstanding of FOTO questions   AROM   Overall AROM Comments not assessed due to recent surgery   PROM   Overall PROM Comments Assessed supine. IR/ER adducted (03/04/15)   Right/Left Shoulder Right   Right Shoulder Flexion 165 Degrees  66   Right Shoulder ABduction 170 Degrees  50   Right Shoulder Internal Rotation 90 Degrees  90   Right Shoulder External Rotation 60 Degrees  0   Strength   Overall Strength Comments not assessed due to recent surgery  OT Treatments/Exercises (OP) - 04/03/15 0001    Exercises   Exercises Shoulder   Shoulder Exercises: Supine   Protraction PROM;10 reps   Horizontal ABduction PROM;10 reps   External Rotation PROM;10 reps   Internal Rotation PROM;10 reps   Flexion PROM;10 reps   ABduction PROM;10 reps   Shoulder Exercises: Seated   Elevation AROM;15 reps   Extension AROM;15 reps   Row AROM;15 reps   Other Seated Exercises thoracic flexion extension 15 times    Shoulder Exercises: Therapy Ball   Flexion 25 reps   ABduction 25 reps   Shoulder Exercises: Isometric Strengthening   Flexion Supine  30"   Extension Supine  30"   External Rotation Supine  30"   Internal Rotation Supine  30"   ABduction Supine  30"   ADduction Supine   30"   Manual Therapy   Manual Therapy Myofascial release   Myofascial Release Myofascial release and manual stretching to right upper arm, scapular, trapezius, biceps, and anterior and posterior shoulder region to decrease pain and improve pain free mobility.                  OT Education - 04/03/15 1016    Education Details discussed use of arm at waist height and lower during ADL completion   Person(s) Educated Patient   Methods Explanation;Demonstration;Verbal cues   Comprehension Verbalized understanding          OT Short Term Goals - 04/03/15 1103    OT SHORT TERM GOAL #1   Title Patient will be educated on HEP.    Time 4   Period Weeks   Status Achieved   OT SHORT TERM GOAL #2   Title Patient will decrease pain to 5/10 during daily activities.    Time 4   Period Weeks   Status Achieved   OT SHORT TERM GOAL #3   Title Patient will decrease facial restrictions to trace amount.    Time 4   Period Weeks   Status On-going   OT SHORT TERM GOAL #4   Title Patient will increase strength to 3-/5 to increase ability to pick up objects when completing yardwork.   Time 4   Period Weeks   Status On-going   OT SHORT TERM GOAL #5   Title Patient will increase PROM to [facility] to increase ability to donn/doff shirts easier.    Time 4   Period Weeks   Status Achieved           OT Long Term Goals - 04/03/15 1103    OT LONG TERM GOAL #1   Title Patient will return to highest level of functioning/independence during all daily and leisure tasks.    Status On-going   OT LONG TERM GOAL #2   Title Patient will decrease pain to 2/10 or less during daily activities.    Status On-going   OT LONG TERM GOAL #3   Title Patient will decrease fascial restrictions to zero in right UE.   Status On-going   OT LONG TERM GOAL #4   Title Patient will increase strength to 5/5 to increase ability to use leaf blower.    Status On-going   OT LONG TERM GOAL #5   Title Patient will  increase AROM to [facility] to increase ability to complete daily tasks such as yardwork with less difficulty.    Status On-going               Plan - 04/03/15 1102  Clinical Impression Statement A:  Mini reassessment completed this date.  Patient has WFL PROM in all shoulder ranges.  Patient is beginning to use her right arm with functional activities at waist height, and pain level has decreased.     Plan P:  Begin AAROM   Consulted and Agree with Plan of Care Patient          G-Codes - 04/25/2015 1104    Functional Assessment Tool Used FOTO score 25/100 75% limited   Functional Limitation Carrying, moving and handling objects   Carrying, Moving and Handling Objects Current Status (U8280) At least 60 percent but less than 80 percent impaired, limited or restricted   Carrying, Moving and Handling Objects Goal Status (K3491) At least 1 percent but less than 20 percent impaired, limited or restricted      Problem List Patient Active Problem List   Diagnosis Date Noted  . History of arthroscopy of right shoulder 02/25/2015  . Rotator cuff tear   . Arthritis, shoulder region   . Nontraumatic rupture of right proximal biceps tendon   . Common cold 12/20/2012  . Back pain 12/20/2012  . Dysuria 12/20/2012  . Left knee pain 03/11/2012  . Hypothyroidism 03/11/2012  . Post-menopausal 03/11/2012    Vangie Bicker, OTR/L [phone]  Apr 25, 2015, 11:05 AM  [place] [address] [place], [place], [address] Phone: [phone]   Fax:  [phone]

## 2015-04-04 ENCOUNTER — Ambulatory Visit (INDEPENDENT_AMBULATORY_CARE_PROVIDER_SITE_OTHER): Payer: Self-pay | Admitting: Orthopedic Surgery

## 2015-04-04 ENCOUNTER — Encounter: Payer: Self-pay | Admitting: Orthopedic Surgery

## 2015-04-04 VITALS — BP 145/77 | Ht 62.0 in | Wt 112.0 lb

## 2015-04-04 DIAGNOSIS — M7521 Bicipital tendinitis, right shoulder: Secondary | ICD-10-CM

## 2015-04-04 DIAGNOSIS — Z9889 Other specified postprocedural states: Secondary | ICD-10-CM

## 2015-04-04 DIAGNOSIS — M129 Arthropathy, unspecified: Secondary | ICD-10-CM

## 2015-04-04 DIAGNOSIS — M19019 Primary osteoarthritis, unspecified shoulder: Secondary | ICD-10-CM

## 2015-04-04 DIAGNOSIS — M75101 Unspecified rotator cuff tear or rupture of right shoulder, not specified as traumatic: Secondary | ICD-10-CM

## 2015-04-04 NOTE — Progress Notes (Signed)
Patient ID: Tami Estrada, female   DOB: 1951-03-28, 64 y.o.   MRN: [id number] Chief Complaint  Patient presents with  . Follow-up    4 week recheck on right shoulder post op , DOS 02-22-15.   PRE-OPERATIVE DIAGNOSIS:  right shoulder arthritis AC joint  POST-OPERATIVE DIAGNOSIS:  right shoulder torn rotator cuff, partial torn biceps tendon, left shoulder joint synovitis, AC joint arthritis  PROCEDURE:  Procedure(s): RIGHT SHOULDER ARTHROSCOPY WITH DISTAL CLAVICLE RESECTION (Right) RIGHT SHOULDER ARTHROSCOPY DEBRIDEMENT OPEN ROTATOR CUFF REPAIR  FINDINGS:  SHOULDER JOINT SYNOVITIS, BICEPS PARTIAL TEAR < 50% ROTATOR CUFF TEAR SUPRASPINATUS   AC JOINT ARTHROSIS   The patient is doing well 6 weeks from surgery she is ready to start exercises above her head and continue with the strengthening program all see her in 6 weeks.  She is taking 1 hydrocodone before she goes to therapy and an ibuprofen at night otherwise doing very well.

## 2015-04-04 NOTE — Patient Instructions (Signed)
SLING CAN COME OFF CONTINUE THERAPY

## 2015-04-08 ENCOUNTER — Telehealth (HOSPITAL_COMMUNITY): Payer: Self-pay

## 2015-04-10 ENCOUNTER — Ambulatory Visit (HOSPITAL_COMMUNITY): Payer: Medicare HMO | Admitting: Specialist

## 2015-04-10 DIAGNOSIS — M25611 Stiffness of right shoulder, not elsewhere classified: Secondary | ICD-10-CM

## 2015-04-10 DIAGNOSIS — R29898 Other symptoms and signs involving the musculoskeletal system: Secondary | ICD-10-CM

## 2015-04-10 DIAGNOSIS — M25511 Pain in right shoulder: Secondary | ICD-10-CM

## 2015-04-10 DIAGNOSIS — Z5189 Encounter for other specified aftercare: Secondary | ICD-10-CM | POA: Diagnosis not present

## 2015-04-10 DIAGNOSIS — M6281 Muscle weakness (generalized): Secondary | ICD-10-CM

## 2015-04-10 NOTE — Therapy (Signed)
[employer] [address] [place], [place], [address] Phone: [phone]   Fax:  [phone]  Occupational Therapy Treatment  Patient Details  Name: Tami Estrada MRN: [id number] Date of Birth: 10/24/1951 Referring Provider:  Carole Civil, MD  Encounter Date: 04/10/2015      OT End of Session - 04/10/15 0855    Visit Number 8   Number of Visits 24   Date for OT Re-Evaluation 04/29/15   Authorization Type [employer] HMO   Authorization Time Period before 17th visit   Authorization - Visit Number 8   Authorization - Number of Visits 17   OT Start Time 0805   OT Stop Time 0850   OT Time Calculation (min) 45 min   Activity Tolerance Patient tolerated treatment well   Behavior During Therapy [facility] for tasks assessed/performed      Past Medical History  Diagnosis Date  . History of [facility] spotted fever   . Thyroid disease   . GERD (gastroesophageal reflux disease)     Esophogeal dysmotility- 2010  . Hypothyroidism     Past Surgical History  Procedure Laterality Date  . Thyroid surgery      thyroidectomy  . Neck surgery      cervical disc  . Back surgery      lumbar disc  . Shoulder arthroscopy with distal clavicle resection Right 02/22/2015    Procedure: RIGHT SHOULDER ARTHROSCOPY WITH DISTAL CLAVICLE RESECTION, DEBRIDEMENT;  Surgeon: Carole Civil, MD;  Location: [facility];  Service: Orthopedics;  Laterality: Right;  . Shoulder open rotator cuff repair Right 02/22/2015    Procedure: ROTATOR CUFF REPAIR SHOULDER OPEN;  Surgeon: Carole Civil, MD;  Location: [facility];  Service: Orthopedics;  Laterality: Right;    There were no vitals filed for this visit.  Visit Diagnosis:  Pain in right shoulder  Decreased range of motion of shoulder, right  Shoulder weakness  Muscle weakness (generalized)      Subjective Assessment - 04/10/15 0805    Subjective  S:  I can wash under my arms and take a good bath  now.  I can use my arm now!   Limitations progress as tolerated   Currently in Pain? No/denies            [facility] OT Assessment - 04/10/15 0001    Assessment   Diagnosis l   Onset Date 02/22/15   Precautions   Precautions Shoulder   Type of Shoulder Precautions progress as tolerated                  OT Treatments/Exercises (OP) - 04/10/15 0001    Exercises   Exercises Shoulder   Shoulder Exercises: Supine   Protraction PROM;AAROM;10 reps   Horizontal ABduction PROM;AAROM;10 reps   External Rotation PROM;AAROM;10 reps   Internal Rotation PROM;AAROM;10 reps   Flexion PROM;AAROM;10 reps   ABduction PROM;AAROM;10 reps   Shoulder Exercises: Seated   Protraction AAROM;10 reps   Horizontal ABduction AAROM;10 reps   External Rotation AAROM;10 reps   Internal Rotation AAROM;10 reps   Flexion AAROM;10 reps   Abduction AAROM;10 reps   Shoulder Exercises: Standing   External Rotation Theraband;10 reps   Theraband Level (Shoulder External Rotation) Level 2 (Red)   Internal Rotation Theraband;10 reps   Theraband Level (Shoulder Internal Rotation) Level 2 (Red)   Extension Theraband;10 reps   Theraband Level (Shoulder Extension) Level 2 (Red)   Row Theraband;10 reps   Theraband Level (  Shoulder Row) Level 2 (Red)   Retraction Theraband;10 reps   Theraband Level (Shoulder Retraction) Level 2 (Red)   Shoulder Exercises: ROM/Strengthening   Wall Wash 1'                 OT Education - 04/10/15 0855    Education provided Yes   Education Details shoulder AAROM in supine and seated   Person(s) Educated Patient   Methods Explanation;Demonstration;Handout;Verbal cues   Comprehension Verbalized understanding;Returned demonstration          OT Short Term Goals - 04/03/15 1103    OT SHORT TERM GOAL #1   Title Patient will be educated on HEP.    Time 4   Period Weeks   Status Achieved   OT SHORT TERM GOAL #2   Title Patient will decrease pain to 5/10 during  daily activities.    Time 4   Period Weeks   Status Achieved   OT SHORT TERM GOAL #3   Title Patient will decrease facial restrictions to trace amount.    Time 4   Period Weeks   Status On-going   OT SHORT TERM GOAL #4   Title Patient will increase strength to 3-/5 to increase ability to pick up objects when completing yardwork.   Time 4   Period Weeks   Status On-going   OT SHORT TERM GOAL #5   Title Patient will increase PROM to [facility] to increase ability to donn/doff shirts easier.    Time 4   Period Weeks   Status Achieved           OT Long Term Goals - 04/03/15 1103    OT LONG TERM GOAL #1   Title Patient will return to highest level of functioning/independence during all daily and leisure tasks.    Status On-going   OT LONG TERM GOAL #2   Title Patient will decrease pain to 2/10 or less during daily activities.    Status On-going   OT LONG TERM GOAL #3   Title Patient will decrease fascial restrictions to zero in right UE.   Status On-going   OT LONG TERM GOAL #4   Title Patient will increase strength to 5/5 to increase ability to use leaf blower.    Status On-going   OT LONG TERM GOAL #5   Title Patient will increase AROM to [facility] to increase ability to complete daily tasks such as yardwork with less difficulty.    Status On-going               Plan - 04/10/15 0856    Clinical Impression Statement A:  Patient began AAROM in supine and seated and issued for HEP.  Patient began scapular theraband this date, requiring max vg and mod-max tactile cues for technique.   Plan P:   Follow up on AAROM HEP, begin AROM in supine.        Problem List Patient Active Problem List   Diagnosis Date Noted  . History of arthroscopy of right shoulder 02/25/2015  . Rotator cuff tear   . Arthritis, shoulder region   . Nontraumatic rupture of right proximal biceps tendon   . Common cold 12/20/2012  . Back pain 12/20/2012  . Dysuria 12/20/2012  . Left knee pain  03/11/2012  . Hypothyroidism 03/11/2012  . Post-menopausal 03/11/2012    Vangie Bicker, OTR/L [phone]  04/10/2015, 8:57 AM  [place] [address] [place], [place], [address] Phone: [phone]  Fax:  [phone]

## 2015-04-10 NOTE — Patient Instructions (Signed)
COMPLETE EACH EXERCISE LYING DOWN, 10 TIMES EACH, 2-3 TIMES PER DAY   1.  BENCH PRESS  2.        ROM: Flexion - Wand   Bring wand directly over head, leading with right side. Reach back until stretch is felt. Hold ____ seconds. Repeat ____ times per set. Do ____ sets per session. Do ____ sessions per day.  http://orth.exer.us/744   Copyright  VHI. All rights reserved.   3 ROM: Abduction - Wand - JUMPING JACK OR SNOW ANGEL   Holding wand with left hand palm up, push wand directly out to side, leading with other hand palm down, until stretch is felt. Hold ____ seconds. Repeat ____ times per set. Do ____ sets per session. Do ____ sessions per day.  http://orth.exer.us/746   Copyright  VHI. All rights reserved.   ROM: Horizontal Abduction / Adduction - Wand   Keeping both palms down, push right hand across body with other hand. Then pull back across body, keeping arms parallel to floor. Do not allow trunk to twist. Hold ____ seconds. Repeat ____ times per set. Do ____ sets per session. Do ____ sessions per day.  http://orth.exer.us/752   Copyright  VHI. All rights reserved.   ROM: External / Internal Rotation - Wand OPEN AND CLOSE YOUR COAT   Holding wand with left hand palm up, push out from body with other hand, palm down. Keep both elbows bent. When stretch is felt, hold ____ seconds. Repeat to other side, leading with same hand. Keep elbows bent. Repeat ____ times per set. Do ____ sets per session. Do ____ sessions per day.  http://orth.exer.us/748   Copyright  VHI. All rights reserved.

## 2015-04-17 ENCOUNTER — Ambulatory Visit (HOSPITAL_COMMUNITY): Payer: Medicare HMO | Admitting: Specialist

## 2015-04-17 DIAGNOSIS — Z5189 Encounter for other specified aftercare: Secondary | ICD-10-CM | POA: Diagnosis not present

## 2015-04-17 DIAGNOSIS — M25611 Stiffness of right shoulder, not elsewhere classified: Secondary | ICD-10-CM

## 2015-04-17 DIAGNOSIS — M25511 Pain in right shoulder: Secondary | ICD-10-CM

## 2015-04-17 DIAGNOSIS — R29898 Other symptoms and signs involving the musculoskeletal system: Secondary | ICD-10-CM

## 2015-04-17 NOTE — Therapy (Signed)
[place], [place], [address] Phone: [phone]   Fax:  [phone]  Occupational Therapy Treatment  Patient Details  Name: Tami Estrada MRN: [id number] Date of Birth: 11-26-1951 Referring Provider:  Carole Civil, MD  Encounter Date: 04/17/2015      OT End of Session - 04/17/15 1158    Visit Number 9   Number of Visits 24   Date for OT Re-Evaluation 04/29/15   Authorization Type [employer] Medicare HMO   Authorization Time Period before 17th visit   Authorization - Visit Number 9   Authorization - Number of Visits 17   OT Start Time 0940   OT Stop Time 1015   OT Time Calculation (min) 35 min   Activity Tolerance Patient tolerated treatment well   Behavior During Therapy [facility] for tasks assessed/performed      Past Medical History  Diagnosis Date  . History of [facility] spotted fever   . Thyroid disease   . GERD (gastroesophageal reflux disease)     Esophogeal dysmotility- 2010  . Hypothyroidism     Past Surgical History  Procedure Laterality Date  . Thyroid surgery      thyroidectomy  . Neck surgery      cervical disc  . Back surgery      lumbar disc  . Shoulder arthroscopy with distal clavicle resection Right 02/22/2015    Procedure: RIGHT SHOULDER ARTHROSCOPY WITH DISTAL CLAVICLE RESECTION, DEBRIDEMENT;  Surgeon: Carole Civil, MD;  Location: [facility];  Service: Orthopedics;  Laterality: Right;  . Shoulder open rotator cuff repair Right 02/22/2015    Procedure: ROTATOR CUFF REPAIR SHOULDER OPEN;  Surgeon: Carole Civil, MD;  Location: [facility];  Service: Orthopedics;  Laterality: Right;    There were no vitals filed for this visit.  Visit Diagnosis:  Pain in right shoulder  Decreased range of motion of shoulder, right  Shoulder weakness      Subjective Assessment - 04/17/15 0940    Subjective  S:  I am sore not in pain   Limitations progress as tolerated   Currently in Pain?  Yes   Pain Score 1    Pain Location Shoulder   Pain Orientation Right   Pain Descriptors / Indicators Aching            [facility] OT Assessment - 04/17/15 0001    Assessment   Diagnosis Arthroscopy of right shoulder   Onset Date 02/22/15   Precautions   Type of Shoulder Precautions progress as tolerated                  OT Treatments/Exercises (OP) - 04/17/15 0001    Exercises   Exercises Shoulder   Shoulder Exercises: Supine   Protraction PROM;AROM;10 reps   Horizontal ABduction PROM;AROM;10 reps   External Rotation PROM;AROM;10 reps   Internal Rotation PROM;AROM;10 reps   Flexion PROM;AROM;10 reps   ABduction PROM;AROM;10 reps   Shoulder Exercises: Seated   Elevation AROM;15 reps   Protraction AAROM;10 reps   Horizontal ABduction AAROM;10 reps   External Rotation AAROM;10 reps   Internal Rotation AAROM;10 reps   Flexion AAROM;10 reps   Abduction AAROM;10 reps   Shoulder Exercises: Standing   External Rotation Theraband;10 reps   Theraband Level (Shoulder External Rotation) Level 3 (Green)   External Rotation Limitations vg and tactile cues for echnique   Internal Rotation Theraband;10 reps   Theraband Level (Shoulder Internal Rotation) Level 3 (Green)  Internal Rotation Limitations tactile cues for technique   Extension Theraband;10 reps   Theraband Level (Shoulder Extension) Level 3 (Green)   Row Theraband;10 reps   Theraband Level (Shoulder Row) Level 3 (Green)   Row Limitations tactile cues for technique   Retraction Theraband;10 reps   Theraband Level (Shoulder Retraction) Level 3 (Green)   Retraction Limitations max tactile cues for proper technique   Shoulder Exercises: ROM/Strengthening   Wall Wash 2'   Proximal Shoulder Strengthening, Supine 10 times without resting   Prot/Ret//Elev/Dep 30 seconds at 90, 45, 120   Manual Therapy   Manual Therapy Myofascial release   Myofascial Release Myofascial release and manual stretching to right upper  arm, scapular, trapezius, biceps, and anterior and posterior shoulder region to decrease pain and improve pain free mobility.                    OT Short Term Goals - 04/03/15 1103    OT SHORT TERM GOAL #1   Title Patient will be educated on HEP.    Time 4   Period Weeks   Status Achieved   OT SHORT TERM GOAL #2   Title Patient will decrease pain to 5/10 during daily activities.    Time 4   Period Weeks   Status Achieved   OT SHORT TERM GOAL #3   Title Patient will decrease facial restrictions to trace amount.    Time 4   Period Weeks   Status On-going   OT SHORT TERM GOAL #4   Title Patient will increase strength to 3-/5 to increase ability to pick up objects when completing yardwork.   Time 4   Period Weeks   Status On-going   OT SHORT TERM GOAL #5   Title Patient will increase PROM to [facility] to increase ability to donn/doff shirts easier.    Time 4   Period Weeks   Status Achieved           OT Long Term Goals - 04/03/15 1103    OT LONG TERM GOAL #1   Title Patient will return to highest level of functioning/independence during all daily and leisure tasks.    Status On-going   OT LONG TERM GOAL #2   Title Patient will decrease pain to 2/10 or less during daily activities.    Status On-going   OT LONG TERM GOAL #3   Title Patient will decrease fascial restrictions to zero in right UE.   Status On-going   OT LONG TERM GOAL #4   Title Patient will increase strength to 5/5 to increase ability to use leaf blower.    Status On-going   OT LONG TERM GOAL #5   Title Patient will increase AROM to [facility] to increase ability to complete daily tasks such as yardwork with less difficulty.    Status On-going               Plan - 04/17/15 1158    Clinical Impression Statement A:  Patient demonstrated full AAROM in supine, therfore began AROM in supine and issued as HEP.  Patient required max vg and mod-max tactile cues with scapular theraband, therefore did not  yet give as HEP.   Plan P:  Follow up on HEP, begin AROM in seated, add UBE, focus on strengthening and end range AROM.        Problem List Patient Active Problem List   Diagnosis Date Noted  . History of arthroscopy of right shoulder 02/25/2015  .  Rotator cuff tear   . Arthritis, shoulder region   . Nontraumatic rupture of right proximal biceps tendon   . Common cold 12/20/2012  . Back pain 12/20/2012  . Dysuria 12/20/2012  . Left knee pain 03/11/2012  . Hypothyroidism 03/11/2012  . Post-menopausal 03/11/2012    Vangie Bicker, OTR/L [phone]  04/17/2015, 12:00 PM  [place] [address] [place], [place], [address] Phone: [phone]   Fax:  [phone]

## 2015-04-17 NOTE — Patient Instructions (Signed)
COMPLETE LYING DOWN, 10 TIMES EACH, 2-3 TIMES PER DAY   ROM: Abduction (Standing)   Bring arms straight out from sides and raise as high as possible without pain. Repeat ____ times per set. Do ____ sets per session. Do ____ sessions per day.  http://orth.exer.us/910   Copyright  VHI. All rights reserved.   Extension (Active) ROM: Extension (Standing)   Bring arms straight back as far as possible without pain. Repeat ____ times per set. Do ____ sets per session. Do ____ sessions per day.  http://orth.exer.us/916   Copyright  VHI. All rights reserved.   ROM: External / Internal Rotation - in Abduction (Standing)   With upper arms parallel to floor and elbows bent at right angles, gently rotate arms up then down as far as possible without pain. Repeat ____ times per set. Do ____ sets per session. Do ____ sessions per day.  http://orth.exer.us/912   Copyright  VHI. All rights reserved.    Flexors Stretch (Active)   Stand, arms straight at sides. Bring arms straight forward and upward as high as possible without pain. Hold ___ seconds. Repeat ___ times per session. Do ___ sessions per day.  Copyright  VHI. All rights reserved.   Scapular Retraction (Standing)   With arms at sides, pinch shoulder blades together. Repeat ____ times per set. Do ____ sets per session. Do ____ sessions per day.  http://orth.exer.us/944   Copyright  VHI. All rights reserved.

## 2015-04-24 ENCOUNTER — Encounter (HOSPITAL_COMMUNITY): Payer: Self-pay

## 2015-04-24 ENCOUNTER — Ambulatory Visit (HOSPITAL_COMMUNITY): Payer: Medicare HMO | Attending: Orthopedic Surgery

## 2015-04-24 DIAGNOSIS — M6281 Muscle weakness (generalized): Secondary | ICD-10-CM

## 2015-04-24 DIAGNOSIS — R29898 Other symptoms and signs involving the musculoskeletal system: Secondary | ICD-10-CM

## 2015-04-24 DIAGNOSIS — Z5189 Encounter for other specified aftercare: Secondary | ICD-10-CM | POA: Diagnosis present

## 2015-04-24 DIAGNOSIS — M25611 Stiffness of right shoulder, not elsewhere classified: Secondary | ICD-10-CM

## 2015-04-24 DIAGNOSIS — M25511 Pain in right shoulder: Secondary | ICD-10-CM

## 2015-04-24 NOTE — Therapy (Signed)
[place], [place], [address] Phone: [phone]   Fax:  [phone]  Occupational Therapy Treatment  Patient Details  Name: Tami Estrada MRN: [id number] Date of Birth: [birth year] Referring Provider:  Carole Civil, MD  Encounter Date: 04/24/2015      OT End of Session - 04/24/15 1015    Visit Number 10   Number of Visits 24   Date for OT Re-Evaluation 04/29/15   Authorization Type [employer] HMO   Authorization Time Period before 17th visit   Authorization - Visit Number 10   Authorization - Number of Visits 17   OT Start Time [phone]   OT Stop Time 1013   OT Time Calculation (min) 45 min   Activity Tolerance Patient tolerated treatment well   Behavior During Therapy [facility] for tasks assessed/performed      Past Medical History  Diagnosis Date  . History of [facility] spotted fever   . Thyroid disease   . GERD (gastroesophageal reflux disease)     Esophogeal dysmotility- 2010  . Hypothyroidism     Past Surgical History  Procedure Laterality Date  . Thyroid surgery      thyroidectomy  . Neck surgery      cervical disc  . Back surgery      lumbar disc  . Shoulder arthroscopy with distal clavicle resection Right 02/22/2015    Procedure: RIGHT SHOULDER ARTHROSCOPY WITH DISTAL CLAVICLE RESECTION, DEBRIDEMENT;  Surgeon: Carole Civil, MD;  Location: [facility];  Service: Orthopedics;  Laterality: Right;  . Shoulder open rotator cuff repair Right 02/22/2015    Procedure: ROTATOR CUFF REPAIR SHOULDER OPEN;  Surgeon: Carole Civil, MD;  Location: [facility];  Service: Orthopedics;  Laterality: Right;    There were no vitals filed for this visit.  Visit Diagnosis:  Pain in right shoulder  Decreased range of motion of shoulder, right  Shoulder weakness  Muscle weakness (generalized)      Subjective Assessment - 04/24/15 0931    Subjective  S: "So far my shoulder is doing awesome."   Currently in Pain? No/denies   Pain Score 0-No pain            [facility] OT Assessment - 04/24/15 0931    Assessment   Diagnosis Arthroscopy of right shoulder   Onset Date 02/22/15   Precautions   Precautions Shoulder   Type of Shoulder Precautions progress as tolerated                  OT Treatments/Exercises (OP) - 04/24/15 0932    Exercises   Exercises Shoulder   Shoulder Exercises: Supine   Protraction PROM;5 reps;AROM;12 reps   Horizontal ABduction PROM;5 reps;AROM;12 reps   External Rotation PROM;5 reps;AROM;12 reps   Internal Rotation PROM;5 reps;AROM;12 reps   Flexion PROM;5 reps;AROM;12 reps   ABduction PROM;5 reps;AROM;12 reps   Shoulder Exercises: Seated   Protraction AROM;10 reps   Horizontal ABduction AROM;10 reps   External Rotation AROM;10 reps   Internal Rotation AROM;10 reps   Flexion AROM;10 reps   Abduction AROM;10 reps   Shoulder Exercises: Standing   External Rotation Theraband;10 reps   Theraband Level (Shoulder External Rotation) Level 3 (Green)   Internal Rotation Theraband;10 reps   Theraband Level (Shoulder Internal Rotation) Level 3 (Green)   Internal Rotation Limitations verbal cues for technique   Extension Theraband;10 reps   Theraband Level (Shoulder Extension) Level 3 (Green)   Row [employer]  Theraband Level (Shoulder Row) Level 3 (Green)   Retraction Theraband;10 reps   Theraband Level (Shoulder Retraction) Level 3 (Green)   Shoulder Exercises: Therapy Ball   Right/Left 10 reps  both directions   Shoulder Exercises: ROM/Strengthening   UBE (Upper Arm Bike) Level 1; 3' forward and 3' backward   Over Head Lace 1'   Proximal Shoulder Strengthening, Supine 10X no rest breaks   Proximal Shoulder Strengthening, Seated 10X no rest breaks   Manual Therapy   Manual Therapy Myofascial release   Myofascial Release Myofascial release and manual stretching to right upper arm, scapular, trapezius, biceps, and anterior and  posterior shoulder region to decrease pain and improve pain free mobility.                    OT Short Term Goals - 04/24/15 1017    OT SHORT TERM GOAL #1   Title Patient will be educated on HEP.    Time 4   Period Weeks   OT SHORT TERM GOAL #2   Title Patient will decrease pain to 5/10 during daily activities.    Time 4   Period Weeks   OT SHORT TERM GOAL #3   Title Patient will decrease facial restrictions to trace amount.    Time 4   Period Weeks   Status On-going   OT SHORT TERM GOAL #4   Title Patient will increase strength to 3-/5 to increase ability to pick up objects when completing yardwork.   Time 4   Period Weeks   Status On-going   OT SHORT TERM GOAL #5   Title Patient will increase PROM to [facility] to increase ability to donn/doff shirts easier.    Time 4   Period Weeks           OT Long Term Goals - 04/03/15 1103    OT LONG TERM GOAL #1   Title Patient will return to highest level of functioning/independence during all daily and leisure tasks.    Status On-going   OT LONG TERM GOAL #2   Title Patient will decrease pain to 2/10 or less during daily activities.    Status On-going   OT LONG TERM GOAL #3   Title Patient will decrease fascial restrictions to zero in right UE.   Status On-going   OT LONG TERM GOAL #4   Title Patient will increase strength to 5/5 to increase ability to use leaf blower.    Status On-going   OT LONG TERM GOAL #5   Title Patient will increase AROM to [facility] to increase ability to complete daily tasks such as yardwork with less difficulty.    Status On-going               Plan - 04/24/15 1007    Clinical Impression Statement A: Followed up with additional HEP exercises Added UBE arm bike, seated AROM, Therapy Ball Left/Right, Proximal Shoulder Strengthening in seated, and Overhead Lacing.  Increased reps for AROM supine to 12.  Pt required min verbal cue for Internal Rotation technique using theraband.  Pt tolerated  exercises well.   Plan P:  Pt due for reassessment next visit (04-29-15) MFR PRN.  Increase UBE bike level to 2.  Increase Theraband reps to 12 and AROM reps for supine and seated to 15.  Increase Proximal Shoulder Strengthening reps for supine and seated to 15.        Problem List Patient Active Problem List   Diagnosis Date Noted  .  History of arthroscopy of right shoulder 02/25/2015  . Rotator cuff tear   . Arthritis, shoulder region   . Nontraumatic rupture of right proximal biceps tendon   . Common cold 12/20/2012  . Back pain 12/20/2012  . Dysuria 12/20/2012  . Left knee pain 03/11/2012  . Hypothyroidism 03/11/2012  . Post-menopausal 03/11/2012    Elba Barman, OTA Student [phone]  04/24/2015, 10:18 AM  [place], [place], [address] Phone: [phone]   Fax:  [phone]

## 2015-05-01 ENCOUNTER — Ambulatory Visit (HOSPITAL_COMMUNITY): Payer: Medicare HMO | Admitting: Specialist

## 2015-05-01 DIAGNOSIS — M6281 Muscle weakness (generalized): Secondary | ICD-10-CM

## 2015-05-01 DIAGNOSIS — Z5189 Encounter for other specified aftercare: Secondary | ICD-10-CM | POA: Diagnosis not present

## 2015-05-01 DIAGNOSIS — M25511 Pain in right shoulder: Secondary | ICD-10-CM

## 2015-05-01 DIAGNOSIS — M25611 Stiffness of right shoulder, not elsewhere classified: Secondary | ICD-10-CM

## 2015-05-01 NOTE — Therapy (Signed)
[place], [place], [address] Phone: [phone]   Fax:  [phone]  Occupational Therapy Treatment  Patient Details  Name: Tami Estrada Date of Birth: 09-22-1951 Referring Provider:  Carole Civil, MD  Encounter Date: 05/01/2015      OT End of Session - 05/01/15 1017    Visit Number 11   Number of Visits 24   Date for OT Re-Evaluation 05/01/15   Authorization Type [employer] H[employer]   Authorization Time Period before 17th visit   Authorization - Visit Number 11   Authorization - Number of Visits 17   OT Start Time [phone]   OT Stop Time 1014   OT Time Calculation (min) 37 min   Activity Tolerance Patient tolerated treatment well   Behavior During Therapy [facility] for tasks assessed/performed      Past Medical History  Diagnosis Date  . History of [facility] spotted fever   . Thyroid disease   . GERD (gastroesophageal reflux disease)     Esophogeal dysmotility- 2010  . Hypothyroidism     Past Surgical History  Procedure Laterality Date  . Thyroid surgery      thyroidectomy  . Neck surgery      cervical disc  . Back surgery      lumbar disc  . Shoulder arthroscopy with distal clavicle resection Right 02/22/2015    Procedure: RIGHT SHOULDER ARTHROSCOPY WITH DISTAL CLAVICLE RESECTION, DEBRIDEMENT;  Surgeon: Carole Civil, MD;  Location: [facility];  Service: Orthopedics;  Laterality: Right;  . Shoulder open rotator cuff repair Right 02/22/2015    Procedure: ROTATOR CUFF REPAIR SHOULDER OPEN;  Surgeon: Carole Civil, MD;  Location: [facility];  Service: Orthopedics;  Laterality: Right;    There were no vitals filed for this visit.  Visit Diagnosis:  Pain in right shoulder  Decreased range of motion of shoulder, right  Muscle weakness (generalized)      Subjective Assessment - 05/01/15 0938    Subjective  S:  Im doing really well.  I can do almost everything I need to do.    Currently in Pain? No/denies            [facility] OT Assessment - 05/01/15 0001    Assessment   Diagnosis Arthroscopy of right shoulder   Onset Date 02/22/15   Precautions   Precautions Shoulder   Type of Shoulder Precautions progress as tolerated   ADL   ADL comments Patient reports she has returned to prior level of function.  The only difficulty she has is pulling the vacuum.  She can sweep the floor with a broom.  She can fasten her bra.   Observation/Other Assessments   Focus on Therapeutic Outcomes (FOTO)  FOTO was 25% and is currently 88% independent   AROM   Overall AROM Comments assessed in seated, ER/IR is [facility]   Right Shoulder Flexion 160 Degrees   Right Shoulder ABduction 152 Degrees   Right Shoulder Internal Rotation 90 Degrees   Right Shoulder External Rotation 80 Degrees   PROM   Overall PROM Comments WFL in supine   Strength   Overall Strength Comments asesessed in standing   Right Shoulder Flexion 4+/5   Right Shoulder ABduction 4+/5   Right Shoulder Internal Rotation 4+/5   Right Shoulder External Rotation 4+/5                  OT Treatments/Exercises (OP) - 05/01/15 0001  Exercises   Exercises Shoulder   Shoulder Exercises: Supine   Protraction PROM;5 reps   Horizontal ABduction PROM;5 reps   External Rotation PROM;5 reps   Internal Rotation PROM;5 reps   Flexion PROM;5 reps   ABduction PROM;5 reps   Shoulder Exercises: Standing   External Rotation Theraband;10 reps   Theraband Level (Shoulder External Rotation) Level 3 (Green)   Internal Rotation Theraband;10 reps   Theraband Level (Shoulder Internal Rotation) Level 3 (Green)   Extension Theraband;10 reps   Theraband Level (Shoulder Extension) Level 3 (Green)   Row Theraband;10 reps   Theraband Level (Shoulder Row) Level 3 (Green)   Retraction Theraband;10 reps   Theraband Level (Shoulder Retraction) Level 3 (Green)   Manual Therapy   Manual Therapy Myofascial release   Myofascial  Release Myofascial release and manual stretching to right upper arm, scapular, trapezius, biceps, and anterior and posterior shoulder region to decrease pain and improve pain free mobility.                  OT Education - 05/31/15 1014    Education provided Yes   Education Details Shoulder AROM transitioning to strengthening and scapular theraband with red theraband.   Person(s) Educated Patient   Methods Explanation;Demonstration;Verbal cues   Comprehension Verbalized understanding;Returned demonstration          OT Short Term Goals - 31-May-2015 0957    OT SHORT TERM GOAL #3   Title Patient will decrease facial restrictions to trace amount.    Status Achieved   OT SHORT TERM GOAL #4   Title Patient will increase strength to 3-/5 to increase ability to pick up objects when completing yardwork.   Status Achieved           OT Long Term Goals - 05-31-15 6333    OT LONG TERM GOAL #1   Title Patient will return to highest level of functioning/independence during all daily and leisure tasks.    Status Achieved   OT LONG TERM GOAL #2   Title Patient will decrease pain to 2/10 or less during daily activities.    Status Achieved   OT LONG TERM GOAL #3   Title Patient will decrease fascial restrictions to zero in right UE.   Status Achieved   OT LONG TERM GOAL #4   Title Patient will increase strength to 5/5 to increase ability to use leaf blower.    Status Partially Met   OT LONG TERM GOAL #5   Title Patient will increase AROM to [facility] to increase ability to complete daily tasks such as yardwork with less difficulty.    Status Achieved               Plan - May 31, 2015 1017    Clinical Impression Statement A:  Ms. Westendorf has improved AROM to WNL and strength to 4+/5 in her right shoulder.  She is satisfied with her current functional status, as she has returned to her prior level of function with every activity except vacuuming.  Patient has met all goals.   Plan P:  DC  from skilled OT intervention this date.   Consulted and Agree with Plan of Care Patient          G-Codes - 05-31-2015 1019    Functional Assessment Tool Used FOTO scored 88% independent and 12% limited    Functional Limitation Carrying, moving and handling objects   Carrying, Moving and Handling Objects Goal Status ([id number]) At least 1 percent but less than  20 percent impaired, limited or restricted   Carrying, Moving and Handling Objects Discharge Status [phone]) At least 1 percent but less than 20 percent impaired, limited or restricted      Problem List Patient Active Problem List   Diagnosis Date Noted  . History of arthroscopy of right shoulder 02/25/2015  . Rotator cuff tear   . Arthritis, shoulder region   . Nontraumatic rupture of right proximal biceps tendon   . Common cold 12/20/2012  . Back pain 12/20/2012  . Dysuria 12/20/2012  . Left knee pain 03/11/2012  . Hypothyroidism 03/11/2012  . Post-menopausal 03/11/2012    Vangie Bicker, OTR/L [phone]  05/01/2015, 10:20 AM  [facility] [address] [place], [place], [address] Phone: [phone]   Fax:  [phone]   OCCUPATIONAL THERAPY DISCHARGE SUMMARY  Visits from Start of Care: 11 Current functional level related to goals / functional outcomes: A:  Ms. Plake has improved AROM to WNL and strength to 4+/5 in her right shoulder.  She is satisfied with her current functional status, as she has returned to her prior level of function with every activity except vacuuming.  Patient has met all goals.    Remaining deficits: Unable to push vacuum on carpet   Education / Equipment: AROM, strengthening for shoulder and theraband for scapular strengthening  Plan: Patient agrees to discharge.  Patient goals were met. Patient is being discharged due to meeting the stated rehab goals.  ?????       Vangie Bicker, OTR/L [phone]

## 2015-05-01 NOTE — Patient Instructions (Signed)
COMPLETE EXERCISES 10-15 TIMES EACH, 2 TIMES PER DAY.  YOU MAY HOLD A WATER BOTTLE FOR ADDITIONAL WEIGHT.  ROM: Abduction (Standing)   Bring arms straight out from sides and raise as high as possible without pain. Repeat ____ times per set. Do ____ sets per session. Do ____ sessions per day.  http://orth.exer.us/910   Copyright  VHI. All rights reserved.   Extension (Active) ROM: Extension (Standing)   Bring arms straight back as far as possible without pain. Repeat ____ times per set. Do ____ sets per session. Do ____ sessions per day.  http://orth.exer.us/916   Copyright  VHI. All rights reserved.   ROM: External / Internal Rotation - in Abduction (Standing)   With upper arms parallel to floor and elbows bent at right angles, gently rotate arms up then down as far as possible without pain. Repeat ____ times per set. Do ____ sets per session. Do ____ sessions per day.  http://orth.exer.us/912   Copyright  VHI. All rights reserved.    Flexors Stretch (Active)   Stand, arms straight at sides. Bring arms straight forward and upward as high as possible without pain. Hold ___ seconds. Repeat ___ times per session. Do ___ sessions per day.  Copyright  VHI. All rights reserved.   Scapular Retraction (Standing)   With arms at sides, pinch shoulder blades together. Repeat ____ times per set. Do ____ sets per session. Do ____ sessions per day.  http://orth.exer.us/944   Copyright  VHI. All rights reserved.   (Home) Extension: Isometric / Bilateral Arm Retraction - Sitting   Facing anchor, hold hands and elbow at shoulder height, with elbow bent.  Pull arms back to squeeze shoulder blades together. Repeat 10-15 times.  Copyright  VHI. All rights reserved.   (Home) Retraction: Row - Bilateral (Anchor)   Facing anchor, arms reaching forward, pull hands toward stomach, keeping elbows bent and at your sides and pinching shoulder blades together. Repeat 10-15  times.  Copyright  VHI. All rights reserved.   (Clinic) Extension / Flexion (Assist)   Face anchor, pull arms back, keeping elbow straight, and squeze shoulder blades together. Repeat 10-15 times.   Copyright  VHI. All rights reserved.  Strengthening: Resisted External Rotation   Hold tubing in right hand, elbow at side and forearm across body. Rotate forearm out. Repeat ____ times per set. Do ____ sets per session. Do ____ sessions per day.  http://orth.exer.us/829   Copyright  VHI. All rights reserved.  Strengthening: Resisted Internal Rotation   Hold tubing in left hand, elbow at side and forearm out. Rotate forearm in across body. Repeat ____ times per set. Do ____ sets per session. Do ____ sessions per day.  http://orth.exer.us/831   Copyright  VHI. All rights reserved.

## 2015-05-08 ENCOUNTER — Encounter (HOSPITAL_COMMUNITY): Payer: Medicare HMO | Admitting: Specialist

## 2015-05-15 ENCOUNTER — Encounter (HOSPITAL_COMMUNITY): Payer: Medicare HMO | Admitting: Specialist

## 2015-05-16 ENCOUNTER — Encounter: Payer: Self-pay | Admitting: Orthopedic Surgery

## 2015-05-16 ENCOUNTER — Ambulatory Visit (INDEPENDENT_AMBULATORY_CARE_PROVIDER_SITE_OTHER): Payer: Medicare HMO | Admitting: Orthopedic Surgery

## 2015-05-16 VITALS — BP 116/71 | Ht 62.0 in | Wt 113.0 lb

## 2015-05-16 DIAGNOSIS — Z9889 Other specified postprocedural states: Secondary | ICD-10-CM

## 2015-05-16 DIAGNOSIS — Z4789 Encounter for other orthopedic aftercare: Secondary | ICD-10-CM

## 2015-05-16 DIAGNOSIS — M75101 Unspecified rotator cuff tear or rupture of right shoulder, not specified as traumatic: Secondary | ICD-10-CM

## 2015-05-16 NOTE — Progress Notes (Signed)
Patient ID: Tami Estrada, female   DOB: 05/27/1951, 64 y.o.   MRN: [id number] Chief Complaint  Patient presents with  . Follow-up    6 week follow up Right shoulder, DOS 02/22/15    This is the dose 12 week postop visit status post right rotator cuff repairPRE-OPERATIVE DIAGNOSIS:  right shoulder arthritis AC joint  POST-OPERATIVE DIAGNOSIS:  right shoulder torn rotator cuff, partial torn biceps tendon, left shoulder joint synovitis, AC joint arthritis  PROCEDURE:  Procedure(s): RIGHT SHOULDER ARTHROSCOPY WITH DISTAL CLAVICLE RESECTION (Right) RIGHT SHOULDER ARTHROSCOPY DEBRIDEMENT OPEN ROTATOR CUFF REPAIR  FINDINGS:  SHOULDER JOINT SYNOVITIS, BICEPS PARTIAL TEAR < 50% ROTATOR CUFF TEAR SUPRASPINATUS   AC JOINT ARTHROSIS   She did very well she is doing very well she completed her therapy. She has full forward elevation she has internal rotation to the mid thoracic region she has no pain she's not taking any medicine for pain  Follow-up 3 months

## 2015-08-15 ENCOUNTER — Ambulatory Visit: Payer: Medicare HMO | Admitting: Orthopedic Surgery

## 2015-08-29 ENCOUNTER — Ambulatory Visit: Payer: Medicare HMO | Admitting: Orthopedic Surgery

## 2015-09-11 ENCOUNTER — Emergency Department (HOSPITAL_COMMUNITY)
Admission: EM | Admit: 2015-09-11 | Discharge: 2015-09-11 | Disposition: A | Discharge status: Home / Self Care | Payer: Medicare HMO | Attending: Emergency Medicine | Admitting: Emergency Medicine

## 2015-09-11 ENCOUNTER — Encounter (HOSPITAL_COMMUNITY): Payer: Self-pay | Admitting: Emergency Medicine

## 2015-09-11 ENCOUNTER — Emergency Department (HOSPITAL_COMMUNITY): Payer: Medicare HMO

## 2015-09-11 DIAGNOSIS — Z8619 Personal history of other infectious and parasitic diseases: Secondary | ICD-10-CM | POA: Insufficient documentation

## 2015-09-11 DIAGNOSIS — E039 Hypothyroidism, unspecified: Secondary | ICD-10-CM | POA: Insufficient documentation

## 2015-09-11 DIAGNOSIS — R110 Nausea: Secondary | ICD-10-CM | POA: Insufficient documentation

## 2015-09-11 DIAGNOSIS — W19XXXA Unspecified fall, initial encounter: Secondary | ICD-10-CM

## 2015-09-11 DIAGNOSIS — S199XXA Unspecified injury of neck, initial encounter: Secondary | ICD-10-CM | POA: Insufficient documentation

## 2015-09-11 DIAGNOSIS — Y9289 Other specified places as the place of occurrence of the external cause: Secondary | ICD-10-CM | POA: Insufficient documentation

## 2015-09-11 DIAGNOSIS — Z8719 Personal history of other diseases of the digestive system: Secondary | ICD-10-CM | POA: Insufficient documentation

## 2015-09-11 DIAGNOSIS — Y998 Other external cause status: Secondary | ICD-10-CM | POA: Insufficient documentation

## 2015-09-11 DIAGNOSIS — S3992XA Unspecified injury of lower back, initial encounter: Secondary | ICD-10-CM | POA: Insufficient documentation

## 2015-09-11 DIAGNOSIS — Y9389 Activity, other specified: Secondary | ICD-10-CM | POA: Insufficient documentation

## 2015-09-11 DIAGNOSIS — W01198A Fall on same level from slipping, tripping and stumbling with subsequent striking against other object, initial encounter: Secondary | ICD-10-CM | POA: Insufficient documentation

## 2015-09-11 DIAGNOSIS — M545 Low back pain, unspecified: Secondary | ICD-10-CM

## 2015-09-11 DIAGNOSIS — Z79899 Other long term (current) drug therapy: Secondary | ICD-10-CM | POA: Insufficient documentation

## 2015-09-11 DIAGNOSIS — S0990XA Unspecified injury of head, initial encounter: Secondary | ICD-10-CM

## 2015-09-11 DIAGNOSIS — Z9889 Other specified postprocedural states: Secondary | ICD-10-CM | POA: Insufficient documentation

## 2015-09-11 MED ORDER — ONDANSETRON 4 MG PO TBDP
4.0000 mg | ORAL_TABLET | Freq: Once | ORAL | Status: AC
  Administered 2015-09-11: 4 mg via ORAL
  Filled 2015-09-11: qty 1

## 2015-09-11 MED ORDER — IBUPROFEN 800 MG PO TABS
800.0000 mg | ORAL_TABLET | Freq: Once | ORAL | Status: AC
  Administered 2015-09-11: 800 mg via ORAL
  Filled 2015-09-11: qty 1

## 2015-09-11 NOTE — ED Notes (Signed)
Pt made aware to return if symptoms worsen or if any life threatening symptoms occur.   

## 2015-09-11 NOTE — ED Notes (Signed)
Fell at 1345, hitting tailbone.  Fall at [facility], water in the floor.  Rates pain 8/10.  Have not taken any pain medication.

## 2015-09-11 NOTE — Discharge Instructions (Signed)
Head Injury You have a head injury. Headaches and throwing up (vomiting) are common after a head injury. It should be easy to wake up from sleeping. Sometimes you must stay in the hospital. Most problems happen within the first 24 hours. Side effects may occur up to 7-10 days after the injury.  WHAT ARE THE TYPES OF HEAD INJURIES? Head injuries can be as minor as a bump. Some head injuries can be more severe. More severe head injuries include:  A jarring injury to the brain (concussion).  A bruise of the brain (contusion). This mean there is bleeding in the brain that can cause swelling.  A cracked skull (skull fracture).  Bleeding in the brain that collects, clots, and forms a bump (hematoma). WHEN SHOULD I GET HELP RIGHT AWAY?   You are confused or sleepy.  You cannot be woken up.  You feel sick to your stomach (nauseous) or keep throwing up (vomiting).  Your dizziness or unsteadiness is getting worse.  You have very bad, lasting headaches that are not helped by medicine. Take medicines only as told by your doctor.  You cannot use your arms or legs like normal.  You cannot walk.  You notice changes in the black spots in the center of the colored part of your eye (pupil).  You have clear or bloody fluid coming from your nose or ears.  You have trouble seeing. During the next 24 hours after the injury, you must stay with someone who can watch you. This person should get help right away (call 911 in the [place] if you start to shake and are not able to control it (have seizures), you pass out, or you are unable to wake up. HOW CAN I PREVENT A HEAD INJURY IN THE FUTURE?  Wear seat belts.  Wear a helmet while bike riding and playing sports like football.  Stay away from dangerous activities around the house. WHEN CAN I RETURN TO NORMAL ACTIVITIES AND ATHLETICS? See your doctor before doing these activities. You should not do normal activities or play contact sports until 1 week  after the following symptoms have stopped:  Headache that does not go away.  Dizziness.  Poor attention.  Confusion.  Memory problems.  Sickness to your stomach or throwing up.  Tiredness.  Fussiness.  Bothered by bright lights or loud noises.  Anxiousness or depression.  Restless sleep. MAKE SURE YOU:   Understand these instructions.  Will watch your condition.  Will get help right away if you are not doing well or get worse. Document Released: 11/19/2008 Document Revised: 04/23/2014 Document Reviewed: 08/14/2013 [facility] Patient Information 2015 [place], [place]. This information is not intended to replace advice given to you by your health care provider. Make sure you discuss any questions you have with your health care provider.  Lumbosacral Strain Lumbosacral strain is a strain of any of the parts that make up your lumbosacral vertebrae. Your lumbosacral vertebrae are the bones that make up the lower third of your backbone. Your lumbosacral vertebrae are held together by muscles and tough, fibrous tissue (ligaments).  CAUSES  A sudden blow to your back can cause lumbosacral strain. Also, anything that causes an excessive stretch of the muscles in the low back can cause this strain. This is typically seen when people exert themselves strenuously, fall, lift heavy objects, bend, or crouch repeatedly. RISK FACTORS  Physically demanding work.  Participation in pushing or pulling sports or sports that require a sudden twist of the back (tennis,  golf, baseball).  Weight lifting.  Excessive lower back curvature.  Forward-tilted pelvis.  Weak back or abdominal muscles or both.  Tight hamstrings. SIGNS AND SYMPTOMS  Lumbosacral strain may cause pain in the area of your injury or pain that moves (radiates) down your leg.  DIAGNOSIS Your health care provider can often diagnose lumbosacral strain through a physical exam. In some cases, you may need tests such as X-ray  exams.  TREATMENT  Treatment for your lower back injury depends on many factors that your clinician will have to evaluate. However, most treatment will include the use of anti-inflammatory medicines. HOME CARE INSTRUCTIONS   Avoid hard physical activities (tennis, racquetball, waterskiing) if you are not in proper physical condition for it. This may aggravate or create problems.  If you have a back problem, avoid sports requiring sudden body movements. Swimming and walking are generally safer activities.  Maintain good posture.  Maintain a healthy weight.  For acute conditions, you may put ice on the injured area.  Put ice in a plastic bag.  Place a towel between your skin and the bag.  Leave the ice on for 20 minutes, 2-3 times a day.  When the low back starts healing, stretching and strengthening exercises may be recommended. SEEK MEDICAL CARE IF:  Your back pain is getting worse.  You experience severe back pain not relieved with medicines. SEEK IMMEDIATE MEDICAL CARE IF:   You have numbness, tingling, weakness, or problems with the use of your arms or legs.  There is a change in bowel or bladder control.  You have increasing pain in any area of the body, including your belly (abdomen).  You notice shortness of breath, dizziness, or feel faint.  You feel sick to your stomach (nauseous), are throwing up (vomiting), or become sweaty.  You notice discoloration of your toes or legs, or your feet get very cold. MAKE SURE YOU:   Understand these instructions.  Will watch your condition.  Will get help right away if you are not doing well or get worse. Document Released: 09/16/2005 Document Revised: 12/12/2013 Document Reviewed: 07/26/2013 [facility] Patient Information 2015 [place], [place]. This information is not intended to replace advice given to you by your health care provider. Make sure you discuss any questions you have with your health care provider.

## 2015-09-12 ENCOUNTER — Ambulatory Visit (INDEPENDENT_AMBULATORY_CARE_PROVIDER_SITE_OTHER): Payer: Medicare HMO | Admitting: Orthopedic Surgery

## 2015-09-12 VITALS — BP 116/64 | Ht 62.0 in | Wt 113.0 lb

## 2015-09-12 DIAGNOSIS — Z9889 Other specified postprocedural states: Secondary | ICD-10-CM | POA: Diagnosis not present

## 2015-09-12 NOTE — Progress Notes (Signed)
   Patient ID: Tami Estrada, female   DOB: January 23, 1951, 64 y.o.   MRN: [id number]  Follow up visit  Chief Complaint  Patient presents with  . Follow-up    3 month recheck on right shoulder, DOS 02-22-15.    BP 116/64 mmHg  Ht 5\' 2"  (1.575 m)  Wt 113 lb (51.256 kg)  BMI 20.66 kg/m2  Aftercare musculoskeletal system Procedure as noted below.  Mild pain in the biceps. She's gained full range of motion and her strength has returned to normal     PRE-OPERATIVE DIAGNOSIS:  right shoulder arthritis AC joint  POST-OPERATIVE DIAGNOSIS:  right shoulder torn rotator cuff, partial torn biceps tendon, left shoulder joint synovitis, AC joint arthritis  PROCEDURE:  Procedure(s): RIGHT SHOULDER ARTHROSCOPY WITH DISTAL CLAVICLE RESECTION (Right) RIGHT SHOULDER ARTHROSCOPY DEBRIDEMENT OPEN ROTATOR CUFF REPAIR  FINDINGS:  SHOULDER JOINT SYNOVITIS, BICEPS PARTIAL TEAR < 50% ROTATOR CUFF TEAR SUPRASPINATUS   AC JOINT ARTHROSIS    Follow-up as needed

## 2015-09-13 NOTE — ED Provider Notes (Signed)
CSN: [id number]     Arrival date & time 09/11/15  1523 History   First MD Initiated Contact with Patient 09/11/15 1636     Chief Complaint  Patient presents with  . Fall  . Tailbone Pain     (Consider location/radiation/quality/duration/timing/severity/associated sxs/prior Treatment) The history is provided by the patient and a relative.   Tami Estrada is a 64 y.o. female with past medical history indicated below presenting for evaluation of fall occuring at 1:45 pm today, slipping on wet floor while visiting at the [facility].  She landed directly on her buttocks, then went backwards, hitting her head against a closed door hard enough to "make it fly open".  She denies loc, dizzines, vomiting or focal weakness since the event but does endorse mild nausea.  She does report headache, neck and lower back and buttock pain.  She drove herself home and has taken tylenol without relief.  The daughter she lives with insisted she return here for evaluation.  Pt doubts any serious injuries but does have increasing soreness.  She is not on any blood thinning agents.    Past Medical History  Diagnosis Date  . History of [facility] spotted fever   . Thyroid disease   . GERD (gastroesophageal reflux disease)     Esophogeal dysmotility- 2010  . Hypothyroidism    Past Surgical History  Procedure Laterality Date  . Thyroid surgery      thyroidectomy  . Neck surgery      cervical disc  . Back surgery      lumbar disc  . Shoulder arthroscopy with distal clavicle resection Right 02/22/2015    Procedure: RIGHT SHOULDER ARTHROSCOPY WITH DISTAL CLAVICLE RESECTION, DEBRIDEMENT;  Surgeon: Carole Civil, MD;  Location: [facility];  Service: Orthopedics;  Laterality: Right;  . Shoulder open rotator cuff repair Right 02/22/2015    Procedure: ROTATOR CUFF REPAIR SHOULDER OPEN;  Surgeon: Carole Civil, MD;  Location: [facility];  Service: Orthopedics;  Laterality: Right;   History reviewed. No pertinent  family history. Social History  Substance Use Topics  . Smoking status: Never Smoker   . Smokeless tobacco: Never Used  . Alcohol Use: No   OB History    No data available     Review of Systems  Constitutional: Negative for fever.  HENT: Negative for congestion and sore throat.   Eyes: Negative.   Respiratory: Negative for chest tightness and shortness of breath.   Cardiovascular: Negative for chest pain.  Gastrointestinal: Negative for nausea, vomiting and abdominal pain.  Genitourinary: Negative.   Musculoskeletal: Positive for back pain and neck pain. Negative for joint swelling and arthralgias.  Skin: Negative.  Negative for rash and wound.  Neurological: Positive for headaches. Negative for dizziness, weakness, light-headedness and numbness.  Psychiatric/Behavioral: Negative.       Allergies  Hydrocodone  Home Medications   Prior to Admission medications   Medication Sig Start Date End Date Taking? Authorizing Provider  levothyroxine (SYNTHROID, LEVOTHROID) 50 MCG tablet Take 50 mcg by mouth daily.   Yes Historical Provider, MD  sulfamethoxazole-trimethoprim (BACTRIM DS,SEPTRA DS) 800-160 MG per tablet Take 1 tablet by mouth 2 (two) times daily. Started 09/04/15 for 10 days 09/04/15  Yes Historical Provider, MD   BP 114/60 mmHg  Pulse 61  Temp(Src) 97.8 F (36.6 C) (Temporal)  Resp 18  Ht 5\' 2"  (1.575 m)  Wt 115 lb (52.164 kg)  BMI 21.03 kg/m2  SpO2 100% Physical Exam  Constitutional: She  is oriented to person, place, and time. She appears well-developed and well-nourished.  HENT:  Head: Normocephalic and atraumatic.  Right Ear: No hemotympanum.  Left Ear: No hemotympanum.  Eyes: Conjunctivae and EOM are normal. Pupils are equal, round, and reactive to light.  Neck: Normal range of motion. Spinous process tenderness and muscular tenderness present. Normal range of motion present.  Cardiovascular: Normal rate, regular rhythm, normal heart sounds and intact  distal pulses.   Pulmonary/Chest: Effort normal and breath sounds normal. She has no wheezes.  Abdominal: Soft. Bowel sounds are normal. There is no tenderness.  Musculoskeletal: Normal range of motion.       Lumbar back: She exhibits bony tenderness. She exhibits no swelling, no deformity and no spasm.  No pelvic instability. No pain with ROM of upper or lower extremities.  No bruising appreciated at sites of injury.  Neurological: She is alert and oriented to person, place, and time. She has normal strength. No cranial nerve deficit or sensory deficit. Gait normal.  Equal grip strength  Skin: Skin is warm and dry.  Psychiatric: She has a normal mood and affect.  Nursing note and vitals reviewed.   ED Course  Procedures (including critical care time) Labs Review Labs Reviewed - No data to display  Imaging Review Dg Lumbar Spine Complete  09/11/2015   CLINICAL DATA:  Low back pain, slipped and fell  EXAM: LUMBAR SPINE - COMPLETE 4+ VIEW  COMPARISON:  06/03/2009 abdominal radiograph  FINDINGS: There is no evidence of lumbar spine fracture. Alignment is normal. Intervertebral disc spaces are maintained. Left pelvic phlebolith reidentified. Mild disc degenerative change noted at L4-L5 and L5-S1.  IMPRESSION: No acute abnormality.  Lower lumbar spine disc degenerative change.   Electronically Signed   By: Conchita Paris M.D.   On: 09/11/2015 17:58   Ct Head Wo Contrast  09/11/2015   CLINICAL DATA:  [place], head trauma, neck pain  EXAM: CT HEAD WITHOUT CONTRAST  CT CERVICAL SPINE WITHOUT CONTRAST  TECHNIQUE: Multidetector CT imaging of the head and cervical spine was performed following the standard protocol without intravenous contrast. Multiplanar CT image reconstructions of the cervical spine were also generated.  COMPARISON:  Brain MRI 09/08/2006 and head CT 08/07/2006  FINDINGS: CT HEAD FINDINGS  No acute hemorrhage, infarct, or mass lesion is identified. No midline shift. Ventricles are normal  in size. Orbits and paranasal sinuses are unremarkable. No skull fracture. Minimal motion artifact. 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  CT CERVICAL SPINE FINDINGS  C1 through the cervicothoracic junction is visualized in its entirety. Anterior fusion hardware is noted with 2 separate plates spanning F7-J8 and C5-C6, respectively. No evidence for hardware failure. No fracture or dislocation is identified. Multilevel facet osteoarthritic change is identified, most prominently C4-C5. No neural foraminal narrowing. Biapical pleural thickening is present.  IMPRESSION: No acute intracranial finding.  Evidence of multilevel cervical fusion as above without acute cervical spine osseous abnormality.  Biapical pleural thickening.   Electronically Signed   By: Conchita Paris M.D.   On: 09/11/2015 18:08   Ct Cervical Spine Wo Contrast  09/11/2015   CLINICAL DATA:  [place], head trauma, neck pain  EXAM: CT HEAD WITHOUT CONTRAST  CT CERVICAL SPINE WITHOUT CONTRAST  TECHNIQUE: Multidetector CT imaging of the head and cervical spine was performed following the standard protocol without intravenous contrast. Multiplanar CT image reconstructions of the cervical spine were also generated.  COMPARISON:  Brain MRI 09/08/2006 and head CT 08/07/2006  FINDINGS:  CT HEAD FINDINGS  No acute hemorrhage, infarct, or mass lesion is identified. No midline shift. Ventricles are normal in size. Orbits and paranasal sinuses are unremarkable. No skull fracture. Minimal motion artifact. 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  CT CERVICAL SPINE FINDINGS  C1 through the cervicothoracic junction is visualized in its entirety. Anterior fusion hardware is noted with 2 separate plates spanning E1-R8 and C5-C6, respectively. No evidence for hardware failure. No fracture or dislocation is identified. Multilevel facet osteoarthritic change is identified, most prominently C4-C5. No neural foraminal narrowing.  Biapical pleural thickening is present.  IMPRESSION: No acute intracranial finding.  Evidence of multilevel cervical fusion as above without acute cervical spine osseous abnormality.  Biapical pleural thickening.   Electronically Signed   By: Conchita Paris M.D.   On: 09/11/2015 18:08   Dg Hips Bilat With Pelvis 3-4 Views  09/11/2015   CLINICAL DATA:  Acute bilateral hip pain after fall. Initial encounter.  EXAM: DG HIP (WITH OR WITHOUT PELVIS) 3-4V BILAT  COMPARISON:  None.  FINDINGS: There is no evidence of hip fracture or dislocation. There is no evidence of arthropathy or other focal bone abnormality.  IMPRESSION: Normal bilateral hips.   Electronically Signed   By: Marijo Conception, M.D.   On: 09/11/2015 18:00   I have personally reviewed and evaluated these images and lab results as part of my medical decision-making.   EKG Interpretation None      MDM   Final diagnoses:  Midline low back pain without sciatica  Fall, initial encounter  Minor head injury without loss of consciousness, initial encounter    Results reviewed with pt and family. Advised ice tx for the next several days, may add heat tx in 2-3 days.  Tylenol or advil prn pain, offered additional pain med, pt deferred.  Advised f/u with pcp or return here for any worsening or new sx.  She was given head injury instructions.  The patient appears reasonably screened and/or stabilized for discharge and I doubt any other medical condition or other [facility] requiring further screening, evaluation, or treatment in the ED at this time prior to discharge. Discussed with Dr. Tomi Bamberger prior to dc home.    Evalee Jefferson, PA-C 09/13/15 1437  Dorie Rank, MD 09/16/15 [phone]

## 2015-10-07 DIAGNOSIS — R21 Rash and other nonspecific skin eruption: Secondary | ICD-10-CM | POA: Diagnosis not present

## 2015-10-16 DIAGNOSIS — Z23 Encounter for immunization: Secondary | ICD-10-CM | POA: Diagnosis not present

## 2015-10-16 DIAGNOSIS — M25511 Pain in right shoulder: Secondary | ICD-10-CM | POA: Diagnosis not present

## 2015-10-16 DIAGNOSIS — E039 Hypothyroidism, unspecified: Secondary | ICD-10-CM | POA: Diagnosis not present

## 2015-10-16 DIAGNOSIS — R69 Illness, unspecified: Secondary | ICD-10-CM | POA: Diagnosis not present

## 2015-10-16 DIAGNOSIS — T63791A Toxic effect of contact with other venomous plant, accidental (unintentional), initial encounter: Secondary | ICD-10-CM | POA: Diagnosis not present

## 2015-10-30 NOTE — Telephone Encounter (Signed)
Called concerning apt. Date and time  [place], [place]; CBIS [phone]

## 2015-12-26 ENCOUNTER — Other Ambulatory Visit (HOSPITAL_COMMUNITY): Payer: Self-pay | Admitting: Pulmonary Disease

## 2015-12-26 ENCOUNTER — Ambulatory Visit (HOSPITAL_COMMUNITY)
Admission: RE | Admit: 2015-12-26 | Discharge: 2015-12-26 | Disposition: A | Discharge status: Home / Self Care | Payer: Medicare HMO | Source: Ambulatory Visit | Attending: Pulmonary Disease | Admitting: Pulmonary Disease

## 2015-12-26 DIAGNOSIS — M79672 Pain in left foot: Secondary | ICD-10-CM

## 2016-02-24 ENCOUNTER — Encounter (HOSPITAL_COMMUNITY): Payer: Self-pay

## 2016-04-08 DIAGNOSIS — M25511 Pain in right shoulder: Secondary | ICD-10-CM | POA: Diagnosis not present

## 2016-04-08 DIAGNOSIS — R69 Illness, unspecified: Secondary | ICD-10-CM | POA: Diagnosis not present

## 2016-04-08 DIAGNOSIS — E039 Hypothyroidism, unspecified: Secondary | ICD-10-CM | POA: Diagnosis not present

## 2017-04-08 DIAGNOSIS — M1990 Unspecified osteoarthritis, unspecified site: Secondary | ICD-10-CM | POA: Diagnosis not present

## 2017-04-08 DIAGNOSIS — E039 Hypothyroidism, unspecified: Secondary | ICD-10-CM | POA: Diagnosis not present

## 2017-04-08 DIAGNOSIS — R69 Illness, unspecified: Secondary | ICD-10-CM | POA: Diagnosis not present

## 2017-07-02 DIAGNOSIS — M1990 Unspecified osteoarthritis, unspecified site: Secondary | ICD-10-CM | POA: Diagnosis not present

## 2017-07-02 DIAGNOSIS — R69 Illness, unspecified: Secondary | ICD-10-CM | POA: Diagnosis not present

## 2017-07-02 DIAGNOSIS — E039 Hypothyroidism, unspecified: Secondary | ICD-10-CM | POA: Diagnosis not present

## 2017-08-13 ENCOUNTER — Other Ambulatory Visit (HOSPITAL_COMMUNITY): Payer: Self-pay | Admitting: Pulmonary Disease

## 2017-08-13 DIAGNOSIS — Z1231 Encounter for screening mammogram for malignant neoplasm of breast: Secondary | ICD-10-CM

## 2017-08-16 ENCOUNTER — Ambulatory Visit (HOSPITAL_COMMUNITY)
Admission: RE | Admit: 2017-08-16 | Discharge: 2017-08-16 | Disposition: A | Discharge status: Home / Self Care | Payer: Medicare HMO | Source: Ambulatory Visit | Attending: Pulmonary Disease | Admitting: Pulmonary Disease

## 2017-08-16 DIAGNOSIS — Z1231 Encounter for screening mammogram for malignant neoplasm of breast: Secondary | ICD-10-CM | POA: Diagnosis not present

## 2017-08-16 LAB — HM MAMMOGRAPHY

## 2018-01-18 DIAGNOSIS — J329 Chronic sinusitis, unspecified: Secondary | ICD-10-CM | POA: Diagnosis not present

## 2018-03-07 DIAGNOSIS — Z809 Family history of malignant neoplasm, unspecified: Secondary | ICD-10-CM | POA: Diagnosis not present

## 2018-03-07 DIAGNOSIS — Z823 Family history of stroke: Secondary | ICD-10-CM | POA: Diagnosis not present

## 2018-03-07 DIAGNOSIS — Z833 Family history of diabetes mellitus: Secondary | ICD-10-CM | POA: Diagnosis not present

## 2018-03-07 DIAGNOSIS — Z8249 Family history of ischemic heart disease and other diseases of the circulatory system: Secondary | ICD-10-CM | POA: Diagnosis not present

## 2018-03-07 DIAGNOSIS — E039 Hypothyroidism, unspecified: Secondary | ICD-10-CM | POA: Diagnosis not present

## 2018-03-07 DIAGNOSIS — Z825 Family history of asthma and other chronic lower respiratory diseases: Secondary | ICD-10-CM | POA: Diagnosis not present

## 2018-06-20 DIAGNOSIS — E039 Hypothyroidism, unspecified: Secondary | ICD-10-CM | POA: Diagnosis not present

## 2018-06-20 DIAGNOSIS — R69 Illness, unspecified: Secondary | ICD-10-CM | POA: Diagnosis not present

## 2018-06-20 DIAGNOSIS — W57XXXA Bitten or stung by nonvenomous insect and other nonvenomous arthropods, initial encounter: Secondary | ICD-10-CM | POA: Diagnosis not present

## 2018-06-20 DIAGNOSIS — M1990 Unspecified osteoarthritis, unspecified site: Secondary | ICD-10-CM | POA: Diagnosis not present

## 2018-08-10 DIAGNOSIS — Z0000 Encounter for general adult medical examination without abnormal findings: Secondary | ICD-10-CM | POA: Diagnosis not present

## 2018-08-10 DIAGNOSIS — Z23 Encounter for immunization: Secondary | ICD-10-CM | POA: Diagnosis not present

## 2018-08-11 DIAGNOSIS — M1990 Unspecified osteoarthritis, unspecified site: Secondary | ICD-10-CM | POA: Diagnosis not present

## 2018-08-11 DIAGNOSIS — E039 Hypothyroidism, unspecified: Secondary | ICD-10-CM | POA: Diagnosis not present

## 2018-08-11 DIAGNOSIS — M25511 Pain in right shoulder: Secondary | ICD-10-CM | POA: Diagnosis not present

## 2018-08-11 DIAGNOSIS — Z0000 Encounter for general adult medical examination without abnormal findings: Secondary | ICD-10-CM | POA: Diagnosis not present

## 2018-10-18 DIAGNOSIS — Z23 Encounter for immunization: Secondary | ICD-10-CM | POA: Diagnosis not present

## 2019-02-08 ENCOUNTER — Ambulatory Visit (HOSPITAL_COMMUNITY)
Admission: RE | Admit: 2019-02-08 | Discharge: 2019-02-08 | Disposition: A | Discharge status: Home / Self Care | Payer: Medicare HMO | Source: Ambulatory Visit | Attending: Pulmonary Disease | Admitting: Pulmonary Disease

## 2019-02-08 ENCOUNTER — Other Ambulatory Visit (HOSPITAL_COMMUNITY): Payer: Self-pay | Admitting: Pulmonary Disease

## 2019-02-08 ENCOUNTER — Encounter: Payer: Self-pay | Admitting: Pulmonary Disease

## 2019-02-08 DIAGNOSIS — R0602 Shortness of breath: Secondary | ICD-10-CM | POA: Diagnosis not present

## 2019-02-08 DIAGNOSIS — R05 Cough: Secondary | ICD-10-CM

## 2019-02-08 DIAGNOSIS — R079 Chest pain, unspecified: Secondary | ICD-10-CM | POA: Diagnosis not present

## 2019-02-08 DIAGNOSIS — E039 Hypothyroidism, unspecified: Secondary | ICD-10-CM | POA: Diagnosis not present

## 2019-02-08 DIAGNOSIS — R5383 Other fatigue: Secondary | ICD-10-CM | POA: Diagnosis not present

## 2019-02-08 DIAGNOSIS — R059 Cough, unspecified: Secondary | ICD-10-CM

## 2019-02-08 DIAGNOSIS — M1990 Unspecified osteoarthritis, unspecified site: Secondary | ICD-10-CM | POA: Diagnosis not present

## 2019-08-08 DIAGNOSIS — Z0000 Encounter for general adult medical examination without abnormal findings: Secondary | ICD-10-CM | POA: Diagnosis not present

## 2019-08-11 ENCOUNTER — Other Ambulatory Visit (HOSPITAL_COMMUNITY): Payer: Self-pay | Admitting: Pulmonary Disease

## 2019-08-11 ENCOUNTER — Ambulatory Visit (HOSPITAL_COMMUNITY)
Admission: RE | Admit: 2019-08-11 | Discharge: 2019-08-11 | Disposition: A | Discharge status: Home / Self Care | Payer: Medicare HMO | Source: Ambulatory Visit | Attending: Pulmonary Disease | Admitting: Pulmonary Disease

## 2019-08-11 ENCOUNTER — Other Ambulatory Visit: Payer: Self-pay

## 2019-08-11 DIAGNOSIS — R079 Chest pain, unspecified: Secondary | ICD-10-CM | POA: Insufficient documentation

## 2019-08-11 DIAGNOSIS — R0602 Shortness of breath: Secondary | ICD-10-CM | POA: Diagnosis not present

## 2019-08-23 DIAGNOSIS — R69 Illness, unspecified: Secondary | ICD-10-CM | POA: Diagnosis not present

## 2019-08-23 DIAGNOSIS — E039 Hypothyroidism, unspecified: Secondary | ICD-10-CM | POA: Diagnosis not present

## 2019-08-23 DIAGNOSIS — Z0000 Encounter for general adult medical examination without abnormal findings: Secondary | ICD-10-CM | POA: Diagnosis not present

## 2019-08-23 DIAGNOSIS — R5383 Other fatigue: Secondary | ICD-10-CM | POA: Diagnosis not present

## 2019-08-23 DIAGNOSIS — M1990 Unspecified osteoarthritis, unspecified site: Secondary | ICD-10-CM | POA: Diagnosis not present

## 2019-08-23 DIAGNOSIS — M25511 Pain in right shoulder: Secondary | ICD-10-CM | POA: Diagnosis not present

## 2019-08-23 LAB — HEPATIC FUNCTION PANEL
ALT: 11 (ref 7–35)
AST: 17 (ref 13–35)
Alkaline Phosphatase: 70 (ref 25–125)
Bilirubin, Total: 0.9

## 2019-08-23 LAB — BASIC METABOLIC PANEL
BUN: 25 — AB (ref 4–21)
CO2: 27 — AB (ref 13–22)
Chloride: 106 (ref 99–108)
Creatinine: 0.8 (ref <=1.1)
Glucose: 80
Potassium: 4.5 (ref 3.4–5.3)
Sodium: 142 (ref 137–147)

## 2019-08-23 LAB — TSH: TSH: 0.02 — AB (ref <=5.90)

## 2019-08-23 LAB — COMPREHENSIVE METABOLIC PANEL
Albumin: 4.3 (ref 3.5–5.0)
Calcium: 9.9 (ref 8.7–10.7)
GFR calc Af Amer: 85
GFR calc non Af Amer: 73
Globulin: 2.4

## 2019-08-23 LAB — LIPID PANEL
Cholesterol: 161 (ref 0–200)
HDL: 68 (ref 35–70)
LDL Cholesterol: 80
Triglycerides: 54 (ref 40–160)

## 2019-08-23 LAB — CBC: RBC: 4.42 (ref 3.87–5.11)

## 2019-08-23 LAB — CBC AND DIFFERENTIAL
HCT: 39 (ref 36–46)
Hemoglobin: 13.4 (ref 12.0–16.0)
Neutrophils Absolute: 2772
Platelets: 219 (ref 150–399)
WBC: 5.2

## 2019-08-29 DIAGNOSIS — R079 Chest pain, unspecified: Secondary | ICD-10-CM | POA: Diagnosis not present

## 2019-08-29 DIAGNOSIS — R0602 Shortness of breath: Secondary | ICD-10-CM | POA: Diagnosis not present

## 2019-08-29 DIAGNOSIS — R5383 Other fatigue: Secondary | ICD-10-CM | POA: Diagnosis not present

## 2019-09-01 ENCOUNTER — Other Ambulatory Visit (HOSPITAL_COMMUNITY): Payer: Self-pay | Admitting: Pulmonary Disease

## 2019-09-01 ENCOUNTER — Other Ambulatory Visit: Payer: Self-pay | Admitting: Pulmonary Disease

## 2019-09-01 DIAGNOSIS — R079 Chest pain, unspecified: Secondary | ICD-10-CM

## 2019-09-04 ENCOUNTER — Other Ambulatory Visit: Payer: Self-pay

## 2019-09-04 ENCOUNTER — Ambulatory Visit (HOSPITAL_COMMUNITY)
Admission: RE | Admit: 2019-09-04 | Discharge: 2019-09-04 | Disposition: A | Discharge status: Home / Self Care | Payer: Medicare HMO | Source: Ambulatory Visit | Attending: Pulmonary Disease | Admitting: Pulmonary Disease

## 2019-09-04 DIAGNOSIS — R0600 Dyspnea, unspecified: Secondary | ICD-10-CM | POA: Diagnosis not present

## 2019-09-04 DIAGNOSIS — R079 Chest pain, unspecified: Secondary | ICD-10-CM | POA: Diagnosis not present

## 2019-09-04 LAB — POCT I-STAT CREATININE: Creatinine, Ser: 0.8 mg/dL (ref 0.44–1.00)

## 2019-09-04 MED ORDER — IOHEXOL 350 MG/ML SOLN
100.0000 mL | Freq: Once | INTRAVENOUS | Status: AC | PRN
  Administered 2019-09-04: 14:00:00 100 mL via INTRAVENOUS

## 2019-09-14 DIAGNOSIS — M1990 Unspecified osteoarthritis, unspecified site: Secondary | ICD-10-CM | POA: Diagnosis not present

## 2019-09-14 DIAGNOSIS — R079 Chest pain, unspecified: Secondary | ICD-10-CM | POA: Diagnosis not present

## 2019-09-14 DIAGNOSIS — R69 Illness, unspecified: Secondary | ICD-10-CM | POA: Diagnosis not present

## 2019-09-14 DIAGNOSIS — E039 Hypothyroidism, unspecified: Secondary | ICD-10-CM | POA: Diagnosis not present

## 2019-09-14 DIAGNOSIS — Z23 Encounter for immunization: Secondary | ICD-10-CM | POA: Diagnosis not present

## 2019-09-19 ENCOUNTER — Encounter: Payer: Self-pay | Admitting: *Deleted

## 2019-09-20 ENCOUNTER — Ambulatory Visit (INDEPENDENT_AMBULATORY_CARE_PROVIDER_SITE_OTHER): Payer: Medicare HMO | Admitting: Cardiovascular Disease

## 2019-09-20 ENCOUNTER — Encounter: Payer: Self-pay | Admitting: Cardiovascular Disease

## 2019-09-20 ENCOUNTER — Other Ambulatory Visit: Payer: Self-pay

## 2019-09-20 VITALS — BP 140/68 | HR 67 | Ht 62.0 in | Wt 109.0 lb

## 2019-09-20 DIAGNOSIS — R079 Chest pain, unspecified: Secondary | ICD-10-CM

## 2019-09-20 DIAGNOSIS — Z01812 Encounter for preprocedural laboratory examination: Secondary | ICD-10-CM | POA: Diagnosis not present

## 2019-09-20 DIAGNOSIS — I209 Angina pectoris, unspecified: Secondary | ICD-10-CM

## 2019-09-20 DIAGNOSIS — R0789 Other chest pain: Secondary | ICD-10-CM | POA: Diagnosis not present

## 2019-09-20 MED ORDER — METOPROLOL TARTRATE 100 MG PO TABS: 100.0000 mg | ORAL_TABLET | Freq: Once | ORAL | 0 refills | Status: DC

## 2019-09-20 NOTE — Progress Notes (Signed)
CARDIOLOGY CONSULT NOTE  Patient ID: Tami Estrada MRN: [id number] DOB/AGE: 1951-01-30 68 y.o.  Admit date: (Not on file) Primary Physician: Sinda Du, MD  Reason for Consultation: Chest pain  HPI: Tami Estrada is a 68 y.o. female who is being seen today for the evaluation of chest pain at the request of Sinda Du, MD.   Chest x-ray on 08/12/2019 demonstrated bilateral midlung and right lung base patchy opacities which were nonspecific.  CT angiography of the chest on 09/04/2019 showed no evidence of pulmonary embolism.  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  Bronchiectasis was present.  I also reviewed notes from her PCP.  She has been experiencing chest pain which radiates to the shoulder blades worse over the past 3 to 4 weeks.  She does have diffuse arthritis and also has some chest wall pain.  She also has anxiety and depression.  I reviewed labs dated 08/23/2019: LDL 80, total cholesterol 161, HDL 68, triglycerides 54, BUN 25, creatinine 0.82, sodium 142, potassium 4.5, white blood cells 5.2, hemoglobin 13.4, platelets 219.  I personally reviewed an ECG performed on 08/09/2019 which demonstrated sinus rhythm with nonspecific T wave abnormalities.  ECG performed in the office today which I ordered and personally interpreted demonstrates normal sinus rhythm with nonspecific ST segment abnormalities.  She is here with her daughter, [name], whom she lives with.  She has been having progressive chest pain and shortness of breath over the last several months but it has been much more noticeable over the past 2 months.  2 months ago she was walking 2 miles every morning and 2 miles every evening.  Now if she is standing for 10 to 15 minutes either washing the dishes or canning vegetables, she has severe retrosternal and upper right-sided chest pain which radiates to the center of her back.  She had been on prednisone and  this made her symptoms improved but once she finished the course of prednisone, symptoms recurred.  She denies leg swelling, palpitations, orthopnea, and paroxysmal nocturnal dyspnea.  Social history: Her husband died 6 years ago.  She lives with her daughter, [name].  Family history: 2 brothers have coronary artery disease and one has stents and a pacemaker.  Her sister has heart problems as well.     Allergies  Allergen Reactions  . Hydrocodone     Nausea/vomit    Current Outpatient Medications  Medication Sig Dispense Refill  . levothyroxine (SYNTHROID, LEVOTHROID) 50 MCG tablet Take 50 mcg by mouth daily.    . predniSONE (DELTASONE) 10 MG tablet Take 10 mg by mouth daily with breakfast.     No current facility-administered medications for this visit.     Past Medical History:  Diagnosis Date  . GERD (gastroesophageal reflux disease)    Esophogeal dysmotility- 2010  . 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 Hypothyroidism   . Thyroid disease     Past Surgical History:  Procedure Laterality Date  . BACK SURGERY     lumbar disc  . NECK SURGERY     cervical disc  . SHOULDER ARTHROSCOPY WITH DISTAL CLAVICLE RESECTION Right 02/22/2015   Procedure: RIGHT SHOULDER ARTHROSCOPY WITH DISTAL CLAVICLE RESECTION, DEBRIDEMENT;  Surgeon: Carole Civil, MD;  Location: [facility];  Service: Orthopedics;  Laterality: Right;  . SHOULDER OPEN ROTATOR CUFF REPAIR Right 02/22/2015   Procedure: ROTATOR CUFF REPAIR SHOULDER OPEN;  Surgeon: Carole Civil,  MD;  Location: [facility];  Service: Orthopedics;  Laterality: Right;  . THYROID SURGERY     thyroidectomy    Social History   Socioeconomic History  . Marital status: Married    Spouse name: Not on file  . Number of children: Not on file  . Years of education: Not on file  . Highest education level: Not on file  Occupational History  . Not on file  Social Needs  . Financial resource strain: Not on file  . Food insecurity     Worry: Not on file    Inability: Not on file  . Transportation needs    Medical: Not on file    Non-medical: Not on file  Tobacco Use  . Smoking status: Never Smoker  . Smokeless tobacco: Never Used  Substance and Sexual Activity  . Alcohol use: No  . Drug use: No  . Sexual activity: Yes    Birth control/protection: Post-menopausal  Lifestyle  . Physical activity    Days per week: Not on file    Minutes per session: Not on file  . Stress: Not on file  Relationships  . Social Herbalist on phone: Not on file    Gets together: Not on file    Attends religious service: Not on file    Active member of club or organization: Not on file    Attends meetings of clubs or organizations: Not on file    Relationship status: Not on file  . Intimate partner violence    Fear of current or ex partner: Not on file    Emotionally abused: Not on file    Physically abused: Not on file    Forced sexual activity: Not on file  Other Topics Concern  . Not on file  Social History Narrative  . Not on file      Current Meds  Medication Sig  . levothyroxine (SYNTHROID, LEVOTHROID) 50 MCG tablet Take 50 mcg by mouth daily.  . predniSONE (DELTASONE) 10 MG tablet Take 10 mg by mouth daily with breakfast.      Review of systems complete and found to be negative unless listed above in HPI    Physical exam Blood pressure 140/68, pulse 67, height 5\' 2"  (1.575 m), weight 109 lb (49.4 kg), SpO2 98 %. General: NAD Neck: No JVD, no thyromegaly or thyroid nodule.  Lungs: Clear to auscultation bilaterally with normal respiratory effort. CV: Nondisplaced PMI. Regular rate and rhythm, normal S1/S2, no S3/S4, no murmur.  No peripheral edema.  No carotid bruit.    Abdomen: Soft, nontender, no distention.  Skin: Intact without lesions or rashes.  Neurologic: Alert and oriented x 3.  Psych: Normal affect. Extremities: No clubbing or cyanosis.  HEENT: Normal.   ECG: Most recent ECG reviewed.    Labs: Lab Results  Component Value Date/Time   K 4.1 02/19/2015 10:10 AM   BUN 18 02/19/2015 10:10 AM   CREATININE 0.80 09/04/2019 02:24 PM   CREATININE 0.78 03/11/2012 10:10 AM   ALT 12 03/11/2012 10:10 AM   TSH 0.033 (L) 09/13/2012 07:45 AM   HGB 11.7 (L) 02/22/2015 03:04 PM     Lipids: Lab Results  Component Value Date/Time   LDLCALC 136 (H) 03/29/2012 09:02 AM   CHOL 229 (H) 03/29/2012 09:02 AM   TRIG 168 (H) 03/29/2012 09:02 AM   HDL 59 03/29/2012 09:02 AM        ASSESSMENT AND PLAN:  1.  Chest pain: She has  typical and atypical features as symptoms appear to improve with prednisone which points to a pulmonary etiology.  ECG demonstrates nonspecific ST segment abnormalities. I will proceed with coronary CT angiography.      Disposition: Follow up in 8-10 weeks  Signed: Kate Sable, M.D., F.A.C.C.  09/20/2019, 1:40 PM

## 2019-09-20 NOTE — Patient Instructions (Addendum)
Medication Instructions:  Continue all current medications.  Labwork: BMET - do just prior to CT   Testing/Procedures:  Coronary CT - done at [facility] will contact with results via phone or letter.    Follow-Up: 8-10 weeks   Any Other Special Instructions Will Be Listed Below (If Applicable).  If you need a refill on your cardiac medications before your next appointment, please call your pharmacy.  ===================================================   Your cardiac CT will be scheduled at one of the below locations:   [facility] [address] [place], [place] [address] [phone]  [place] [address] [place], [place] [address] [phone]  If scheduled at [facility], please arrive at the [facility] main entrance of [facility] 30-45 minutes prior to test start time. Proceed to the [facility] Radiology Department (first floor) to check-in and test prep.  If scheduled at [facility], please arrive 15 mins early for check-in and test prep.  Please follow these instructions carefully (unless otherwise directed):  Hold all erectile dysfunction medications at least 3 days (72 hrs) prior to test.  On the Night Before the Test: . Be sure to Drink plenty of water. . Do not consume any caffeinated/decaffeinated beverages or chocolate 12 hours prior to your test. . Do not take any antihistamines 12 hours prior to your test. . If the patient has contrast allergy: ? Patient will need a prescription for Prednisone and very clear instructions (as follows): 1. Prednisone 50 mg - take 13 hours prior to test 2. Take another Prednisone 50 mg 7 hours prior to test 3. Take another Prednisone 50 mg 1 hour prior to test 4. Take Benadryl 50 mg 1 hour prior to test . Patient must complete all four doses of above prophylactic medications. . Patient will need a  ride after test due to Benadryl.  On the Day of the Test: . Drink plenty of water. Do not drink any water within one hour of the test. . Do not eat any food 4 hours prior to the test. . You may take your regular medications prior to the test.  . Take metoprolol (Lopressor) two hours prior to test.  (LOPRESSOR 100MG  X 1)  . HOLD Furosemide/Hydrochlorothiazide morning of the test. . FEMALES- please wear underwire-free bra if available  After the Test: . Drink plenty of water. . After receiving IV contrast, you may experience a mild flushed feeling. This is normal. . On occasion, you may experience a mild rash up to 24 hours after the test. This is not dangerous. If this occurs, you can take Benadryl 25 mg and increase your fluid intake. . If you experience trouble breathing, this can be serious. If it is severe call [phone] IMMEDIATELY. If it is mild, please call our office. . If you take any of these medications: Glipizide/Metformin, Avandament, Glucavance, please do not take 48 hours after completing test unless otherwise instructed.    Please contact the cardiac imaging nurse navigator should you have any questions/concerns Marchia Bond, RN Navigator Cardiac Imaging [place] [facility] [phone] Office  [phone] Cell

## 2019-10-02 DIAGNOSIS — R079 Chest pain, unspecified: Secondary | ICD-10-CM | POA: Diagnosis not present

## 2019-10-02 DIAGNOSIS — E039 Hypothyroidism, unspecified: Secondary | ICD-10-CM | POA: Diagnosis not present

## 2019-10-02 DIAGNOSIS — M1990 Unspecified osteoarthritis, unspecified site: Secondary | ICD-10-CM | POA: Diagnosis not present

## 2019-10-02 DIAGNOSIS — R5383 Other fatigue: Secondary | ICD-10-CM | POA: Diagnosis not present

## 2019-10-06 ENCOUNTER — Other Ambulatory Visit: Payer: Self-pay | Admitting: Cardiovascular Disease

## 2019-10-06 DIAGNOSIS — I209 Angina pectoris, unspecified: Secondary | ICD-10-CM | POA: Diagnosis not present

## 2019-10-06 DIAGNOSIS — Z01812 Encounter for preprocedural laboratory examination: Secondary | ICD-10-CM | POA: Diagnosis not present

## 2019-10-07 LAB — BASIC METABOLIC PANEL WITH GFR
BUN: 19 mg/dL (ref 7–25)
CO2: 28 mmol/L (ref 20–32)
Calcium: 9.5 mg/dL (ref 8.6–10.4)
Chloride: 105 mmol/L (ref 98–110)
Creat: 0.88 mg/dL (ref 0.60–0.93)
GFR, Est African American: 77 mL/min/{1.73_m2} (ref >=60)
GFR, Est Non African American: 67 mL/min/{1.73_m2} (ref >=60)
Glucose, Bld: 80 mg/dL (ref 65–99)
Potassium: 3.9 mmol/L (ref 3.5–5.3)
Sodium: 143 mmol/L (ref 135–146)

## 2019-10-09 ENCOUNTER — Telehealth (HOSPITAL_COMMUNITY): Payer: Self-pay | Admitting: Emergency Medicine

## 2019-10-09 NOTE — Telephone Encounter (Signed)
Reaching out to patient to offer assistance regarding upcoming cardiac imaging study; pt verbalizes understanding of appt date/time, parking situation and where to check in, pre-test NPO status and medications ordered, and verified current allergies; name and call back number provided for further questions should they arise Elijha Dedman RN Navigator Cardiac Imaging [facility] [phone] office [phone] cell 

## 2019-10-10 ENCOUNTER — Telehealth: Payer: Self-pay | Admitting: *Deleted

## 2019-10-10 NOTE — Telephone Encounter (Signed)
Notes recorded by Laurine Blazer, LPN on [id number] at 10:38 AM EDT  Patient notified. Copy to pmd. Pre-CT lab.  ------   Notes recorded by Herminio Commons, MD on 10/09/2019 at 9:46 AM EDT  good

## 2019-10-11 ENCOUNTER — Ambulatory Visit (HOSPITAL_COMMUNITY)
Admission: RE | Admit: 2019-10-11 | Discharge: 2019-10-11 | Disposition: A | Discharge status: Home / Self Care | Payer: Medicare HMO | Source: Ambulatory Visit | Attending: Cardiovascular Disease | Admitting: Cardiovascular Disease

## 2019-10-11 ENCOUNTER — Other Ambulatory Visit: Payer: Self-pay

## 2019-10-11 DIAGNOSIS — I209 Angina pectoris, unspecified: Secondary | ICD-10-CM

## 2019-10-11 MED ORDER — NITROGLYCERIN 0.4 MG SL SUBL
SUBLINGUAL_TABLET | SUBLINGUAL | Status: AC
  Filled 2019-10-11: qty 2

## 2019-10-11 MED ORDER — IOHEXOL 350 MG/ML SOLN
100.0000 mL | Freq: Once | INTRAVENOUS | Status: AC | PRN
  Administered 2019-10-11: 100 mL via INTRAVENOUS

## 2019-10-11 MED ORDER — NITROGLYCERIN 0.4 MG SL SUBL
0.8000 mg | SUBLINGUAL_TABLET | Freq: Once | SUBLINGUAL | Status: AC
  Administered 2019-10-11: 15:00:00 0.8 mg via SUBLINGUAL

## 2019-10-20 ENCOUNTER — Telehealth: Payer: Self-pay | Admitting: *Deleted

## 2019-10-20 NOTE — Telephone Encounter (Signed)
Notes recorded by Laurine Blazer, LPN on [id number] at 5:52 PM EDT  Patient notified. Copy to pmd. Follow up cancelled per MD request.  ------   Notes recorded by Laurine Blazer, LPN on [id number] at 5:56 PM EDT  Left message to return call.   ------   Notes recorded by Laurine Blazer, LPN on [id number] at 5:29 PM EDT  Left message to return call.   ------   Notes recorded by Herminio Commons, MD on 10/12/2019 at 9:10 AM EDT  No coronary artery disease. Calcium score of 0. She can follow-up as needed. Please cancel the scheduled follow-up appointment. Forward a copy to PCP as well.

## 2019-11-02 DIAGNOSIS — M1990 Unspecified osteoarthritis, unspecified site: Secondary | ICD-10-CM | POA: Diagnosis not present

## 2019-11-02 DIAGNOSIS — R9389 Abnormal findings on diagnostic imaging of other specified body structures: Secondary | ICD-10-CM | POA: Diagnosis not present

## 2019-11-02 DIAGNOSIS — E039 Hypothyroidism, unspecified: Secondary | ICD-10-CM | POA: Diagnosis not present

## 2019-11-02 DIAGNOSIS — R079 Chest pain, unspecified: Secondary | ICD-10-CM | POA: Diagnosis not present

## 2019-11-20 ENCOUNTER — Ambulatory Visit: Payer: Medicare HMO | Admitting: Cardiovascular Disease

## 2019-11-27 ENCOUNTER — Ambulatory Visit (HOSPITAL_COMMUNITY)
Admission: RE | Admit: 2019-11-27 | Discharge: 2019-11-27 | Disposition: A | Discharge status: Home / Self Care | Payer: Medicare HMO | Source: Ambulatory Visit | Attending: Pulmonary Disease | Admitting: Pulmonary Disease

## 2019-11-27 ENCOUNTER — Other Ambulatory Visit (HOSPITAL_COMMUNITY): Payer: Self-pay | Admitting: Pulmonary Disease

## 2019-11-27 ENCOUNTER — Other Ambulatory Visit: Payer: Self-pay

## 2019-11-27 ENCOUNTER — Encounter (HOSPITAL_COMMUNITY): Payer: Self-pay

## 2019-11-27 DIAGNOSIS — M47814 Spondylosis without myelopathy or radiculopathy, thoracic region: Secondary | ICD-10-CM | POA: Diagnosis not present

## 2019-11-27 DIAGNOSIS — R079 Chest pain, unspecified: Secondary | ICD-10-CM | POA: Diagnosis not present

## 2019-12-09 DIAGNOSIS — M1990 Unspecified osteoarthritis, unspecified site: Secondary | ICD-10-CM

## 2019-12-09 DIAGNOSIS — T63791A Toxic effect of contact with other venomous plant, accidental (unintentional), initial encounter: Secondary | ICD-10-CM

## 2019-12-09 DIAGNOSIS — Z634 Disappearance and death of family member: Secondary | ICD-10-CM

## 2019-12-09 DIAGNOSIS — R5383 Other fatigue: Secondary | ICD-10-CM

## 2019-12-09 DIAGNOSIS — R079 Chest pain, unspecified: Secondary | ICD-10-CM

## 2019-12-09 DIAGNOSIS — R9389 Abnormal findings on diagnostic imaging of other specified body structures: Secondary | ICD-10-CM

## 2019-12-09 DIAGNOSIS — M25511 Pain in right shoulder: Secondary | ICD-10-CM

## 2019-12-09 LAB — GALACTOSE-ALPHA-1,3-GALACTOSE IGE: Galactose-alpha-1,3-galactose IgE: 0.12

## 2019-12-09 LAB — LYME, TOTAL AB TEST/REFLEX: Lyme IgG/IgM Ab: <0.9

## 2020-01-16 IMAGING — CT CT ANGIO CHEST
2 of 6 series · 18 of 36 positions shown · IV contrast (omnipaque)
Comparison: 08/11/2019 chest radiograph.

CLINICAL DATA: Mid chest pain and tightness for over 6 months.
Dyspnea. Bilateral patchy lung opacities on recent chest radiograph.

EXAM:
CT ANGIOGRAPHY CHEST WITH CONTRAST
TECHNIQUE: Multidetector CT imaging of the chest was performed using the
standard protocol during bolus administration of intravenous
contrast. Multiplanar CT image reconstructions and MIPs were
obtained to evaluate the vascular anatomy.
CONTRAST:  100mL OMNIPAQUE IOHEXOL 350 MG/ML SOLN

[Series 6: pe axial thins · axial · 0.58mm/px · z∈[+3,+252]mm · 17 of 277 slices shown]
[im 14/277  lung]
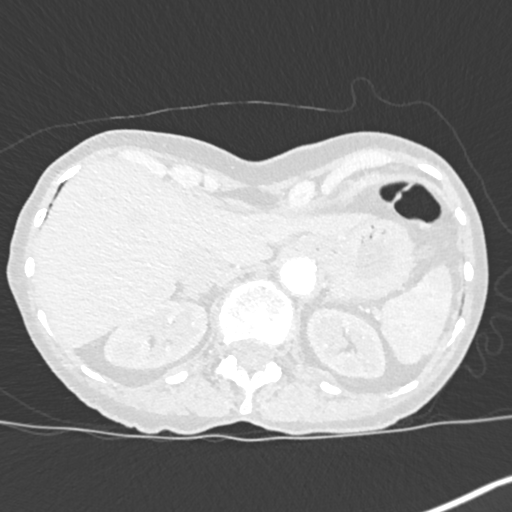
[im 28/277  mediastinal]
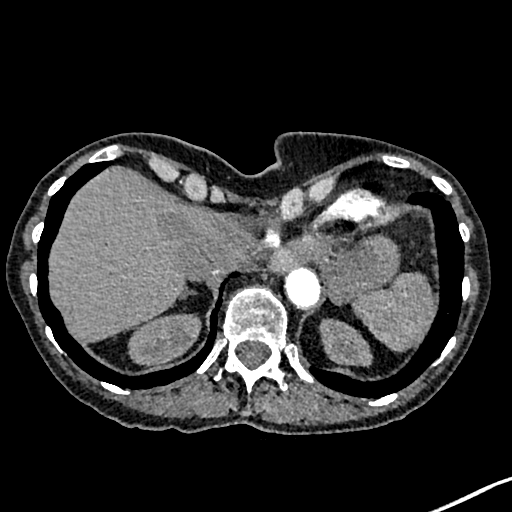
[im 42/277  lung]
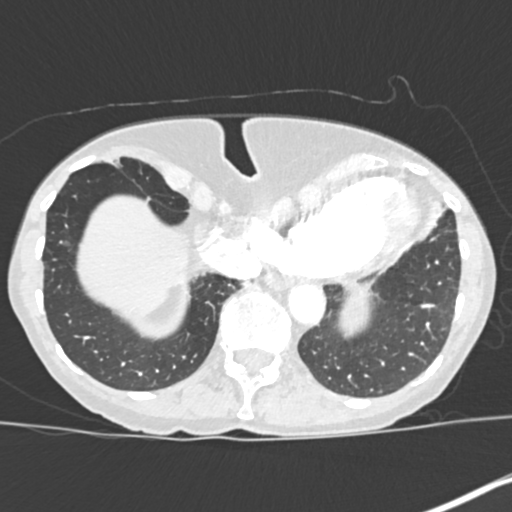
[im 56/277  mediastinal]
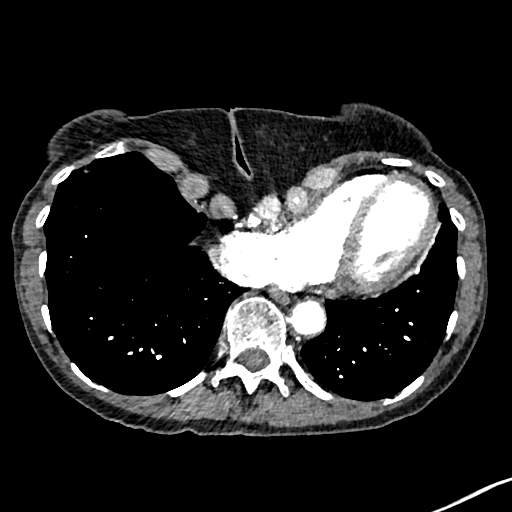
[im 83/277  lung]
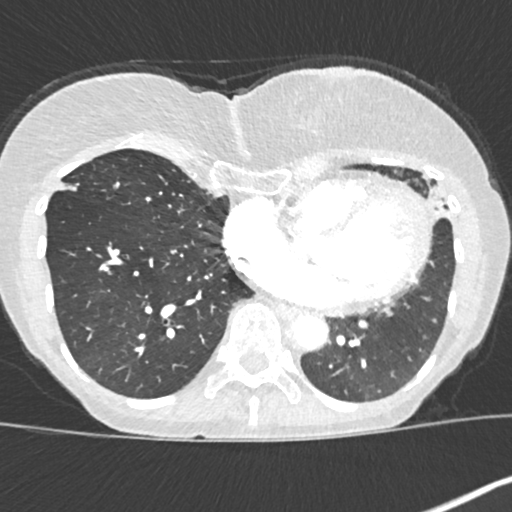
[im 97/277  mediastinal]
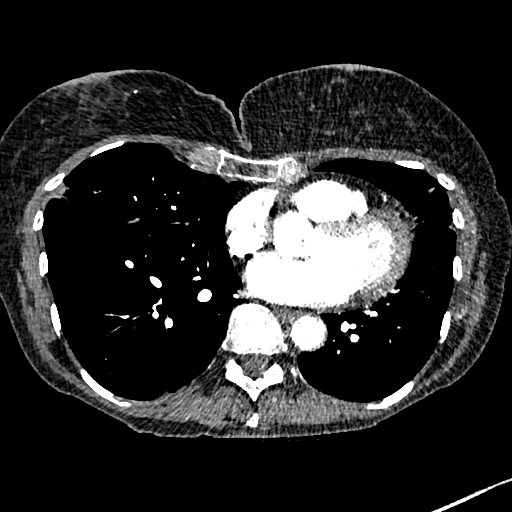
[im 111/277  lung]
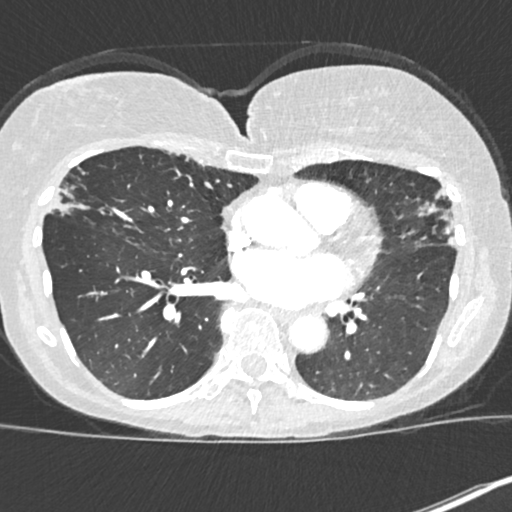
[im 125/277  mediastinal]
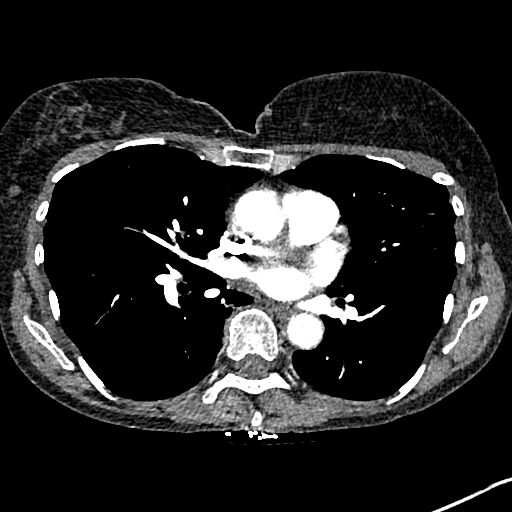
[im 139/277  lung]
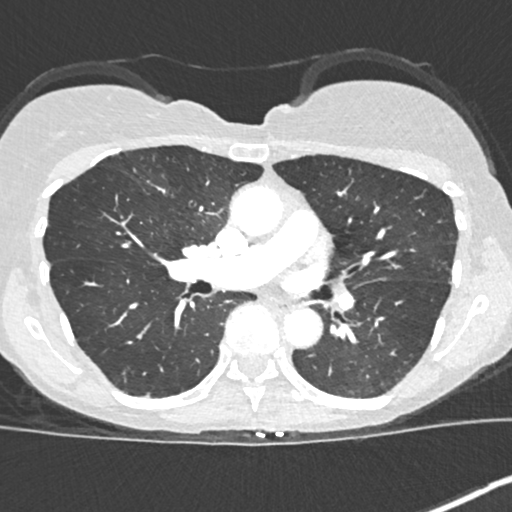
[im 152/277  mediastinal]
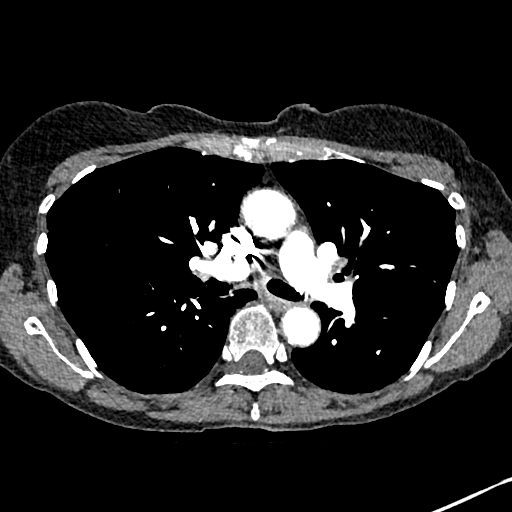
[im 166/277  lung]
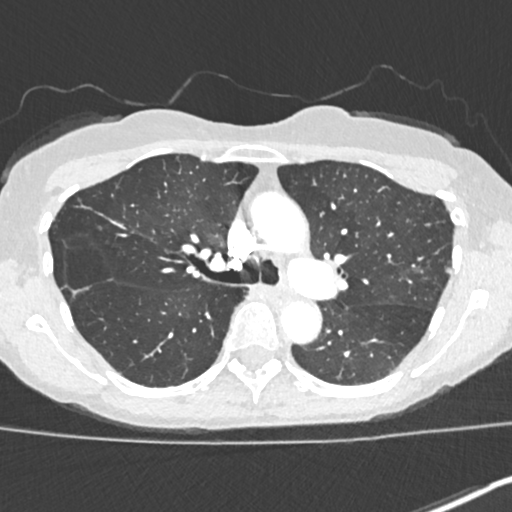
[im 180/277  mediastinal]
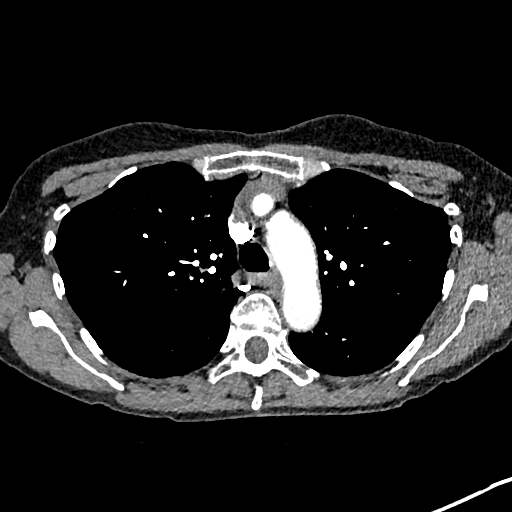
[im 194/277  lung]
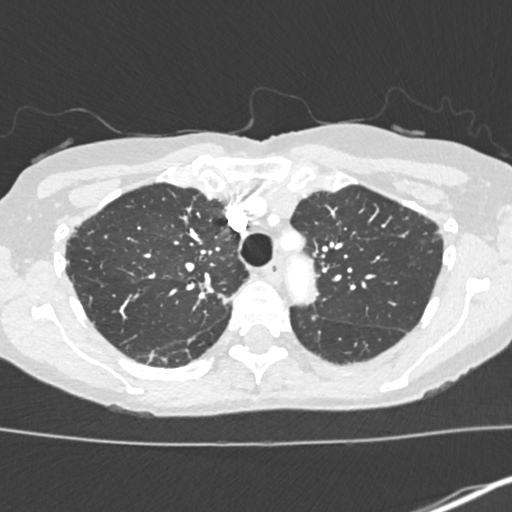
[im 221/277  mediastinal]
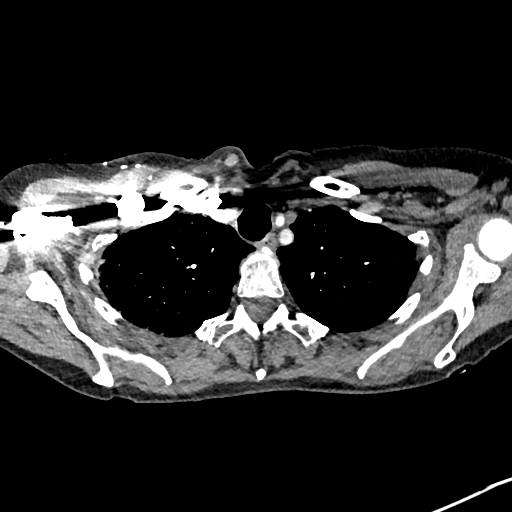
[im 235/277  lung]
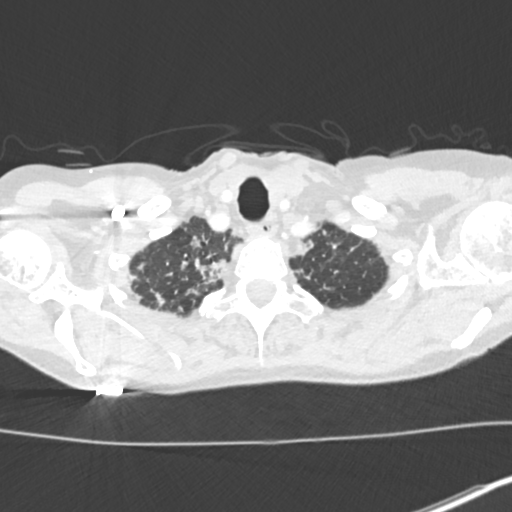
[im 249/277  mediastinal]
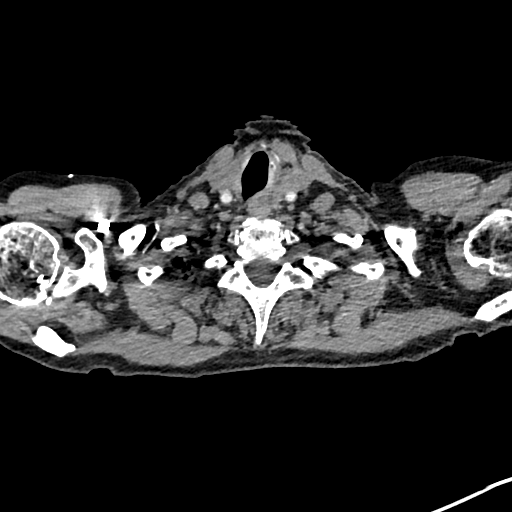
[im 263/277  lung]
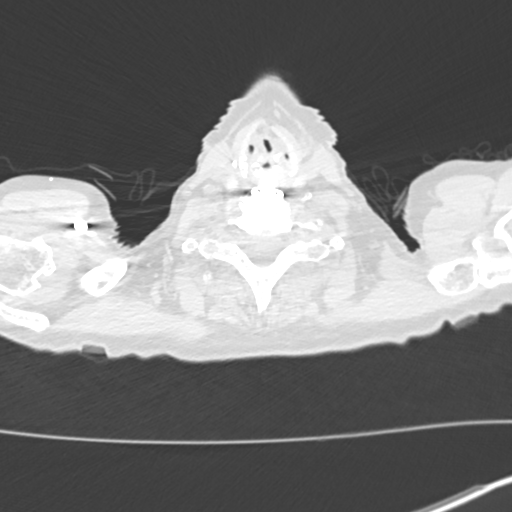

[Series 8: cor soft · coronal · 0.55mm/px · 1 of 98 slices shown]
[im 49/98  mediastinal]
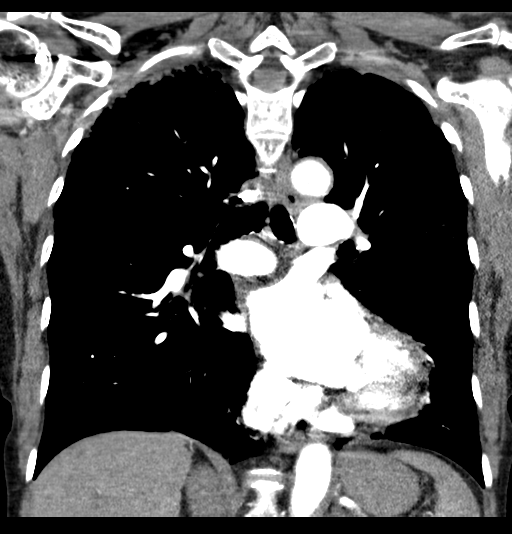

[18 of 36 positions shown; findings below may reference images not displayed]

FINDINGS: Cardiovascular: The study is high quality for the evaluation of
pulmonary embolism. There are no filling defects in the central,
lobar, segmental or subsegmental pulmonary artery branches to
suggest acute pulmonary embolism. Mildly atherosclerotic
nonaneurysmal thoracic aorta. Normal caliber pulmonary arteries.
Top-normal heart size. No significant pericardial fluid/thickening.

Mediastinum/Nodes: No discrete thyroid nodules. Unremarkable
esophagus. No pathologically enlarged axillary, mediastinal or hilar
lymph nodes.

Lungs/Pleura: No pneumothorax. No pleural effusion. There is patchy
mild cylindrical and varicoid bronchiectasis in both lungs, most
prominent in the right middle lobe and lingula. There is associated
patchy tree-in-bud opacity, patchy nodular peribronchovascular
opacities and mild architectural distortion and volume loss in both
lungs at the areas of bronchiectasis. Representative nodular
peribronchovascular 1.0 cm focus of consolidation in peripheral
right middle lobe (series 7/image 82). Irregular patchy subpleural
foci of consolidation at both lung apices in association with mild
bronchiectasis. No lung masses. Mild mosaic attenuation in both
lungs. No central airway stenoses.

Upper abdomen: No acute abnormality.

Musculoskeletal: No aggressive appearing focal osseous lesions. Soft
tissue anchor in the right humeral head. Mild thoracic spondylosis.
Partially visualized surgical hardware from ACDF in cervical spine.

Review of the MIP images confirms the above findings.
IMPRESSION: 1. No evidence of pulmonary embolism.
2. Spectrum of findings in the mid to upper lungs bilaterally most
suggestive of chronic infectious bronchiolitis due to atypical
mycobacterial infection (YUSUKE), with bronchiectasis, tree-in-bud
opacities, nodular peribronchovascular foci of consolidation and
scarring. Suggest attention on follow-up high-resolution chest CT
study in 6 months.

Aortic Atherosclerosis (AM99A-6FR.R).

## 2020-02-22 ENCOUNTER — Other Ambulatory Visit: Payer: Self-pay | Admitting: Family Medicine

## 2020-02-26 ENCOUNTER — Other Ambulatory Visit: Payer: Self-pay | Admitting: Family Medicine

## 2020-02-26 ENCOUNTER — Ambulatory Visit (HOSPITAL_COMMUNITY)
Admission: RE | Admit: 2020-02-26 | Discharge: 2020-02-26 | Disposition: A | Discharge status: Home / Self Care | Payer: Medicare HMO | Source: Ambulatory Visit | Attending: Family Medicine | Admitting: Family Medicine

## 2020-02-26 ENCOUNTER — Encounter: Payer: Self-pay | Admitting: Family Medicine

## 2020-02-26 ENCOUNTER — Other Ambulatory Visit: Payer: Self-pay

## 2020-02-26 ENCOUNTER — Ambulatory Visit (INDEPENDENT_AMBULATORY_CARE_PROVIDER_SITE_OTHER): Payer: Medicare HMO | Admitting: Family Medicine

## 2020-02-26 VITALS — BP 149/69 | HR 74 | Temp 97.8°F | Ht 62.0 in | Wt 103.0 lb

## 2020-02-26 DIAGNOSIS — R030 Elevated blood-pressure reading, without diagnosis of hypertension: Secondary | ICD-10-CM

## 2020-02-26 DIAGNOSIS — R0789 Other chest pain: Secondary | ICD-10-CM

## 2020-02-26 DIAGNOSIS — E039 Hypothyroidism, unspecified: Secondary | ICD-10-CM

## 2020-02-26 DIAGNOSIS — J984 Other disorders of lung: Secondary | ICD-10-CM | POA: Diagnosis not present

## 2020-02-26 DIAGNOSIS — R079 Chest pain, unspecified: Secondary | ICD-10-CM

## 2020-02-26 DIAGNOSIS — R9389 Abnormal findings on diagnostic imaging of other specified body structures: Secondary | ICD-10-CM | POA: Diagnosis not present

## 2020-02-26 LAB — TSH: TSH: 0.02 mIU/L — ABNORMAL LOW (ref 0.40–4.50)

## 2020-02-26 NOTE — Patient Instructions (Addendum)
  Repeat chest xray at [facility] Pulmonary referral Call for mammogram referral TSH  If you have lab work done today you will be contacted with your lab results within the next 2 weeks.  If you have not heard from [place] then please contact us. The fastest way to get your results is to register for My Chart.   IF you received an x-ray today, you will receive an invoice from [facility]. Please contact [facility] at [phone] with questions or concerns regarding your invoice.   IF you received labwork today, you will receive an invoice from [facility]. Please contact [facility] at [phone] with questions or concerns regarding your invoice.   Our billing staff will not be able to assist you with questions regarding bills from these companies.  You will be contacted with the lab results as soon as they are available. The fastest way to get your results is to activate your My Chart account. Instructions are located on the last page of this paperwork. If you have not heard from [place] regarding the results in 2 weeks, please contact this office.

## 2020-02-26 NOTE — Progress Notes (Signed)
New Patient Office Visit  Subjective:  Patient ID: Tami Estrada, female    DOB: 10/20/1951  Age: 69 y.o. MRN: [id number]  CC:  Chief Complaint  Patient presents with  . Follow-up    need refill on synthroid 88mg  and need to get lungs checked. Suppose to had a f/u xray but didi not have a dr. Patient stated Tami Estrada is still having lung problems    HPI Tami Estrada presents for abnormal chest xray, chest wall pain-front to back of the chest-pt was followed by Dr. Charmayne Sheer in the past.  No longer taking steroids.Ibuprofen taken prn  Hypothyroid-synthroid daily longterm-surgery in the past-nodule removed due to goiter Pt does not want mammogram due to pain-pt lost weight when husband died  Cardiologist gave pt metoprolol to take prior to CT coronary fractional flow-recommended-calcium score 0-no recommended follow up Past Medical History:  Diagnosis Date  . Abnormal findings on diagnostic imaging of other specified body structures   . Bitten or stung by nonvenomous insect and other nonvenomous arthropods, initial encounter   . Chest pain, unspecified   . Disappearance and death of family member   . GERD (gastroesophageal reflux disease)    Esophogeal dysmotility- 2010  . History of [facility] spotted fever   . Hypothyroidism   . Other fatigue   . Pain in right shoulder   . Thyroid disease   . Toxic effect of contact with other venomous plant, accidental (unintentional), initial encounter   . Unspecified osteoarthritis, unspecified site     Past Surgical History:  Procedure Laterality Date  . BACK SURGERY     lumbar disc  . NECK SURGERY     cervical disc  . SHOULDER ARTHROSCOPY WITH DISTAL CLAVICLE RESECTION Right 02/22/2015   Procedure: RIGHT SHOULDER ARTHROSCOPY WITH DISTAL CLAVICLE RESECTION, DEBRIDEMENT;  Surgeon: Carole Civil, MD;  Location: [facility];  Service: Orthopedics;  Laterality: Right;  . SHOULDER OPEN ROTATOR CUFF REPAIR Right 02/22/2015   Procedure:  ROTATOR CUFF REPAIR SHOULDER OPEN;  Surgeon: Carole Civil, MD;  Location: [facility];  Service: Orthopedics;  Laterality: Right;  . THYROID SURGERY     thyroidectomy    Family History  Problem Relation Age of Onset  . Diabetes Sister   . Cancer Sister     Social History   Socioeconomic History  . Marital status: Widowed    Spouse name: Not on file  . Number of children: Not on file  . Years of education: Not on file  . Highest education level: Not on file  Occupational History  . Not on file  Tobacco Use  . Smoking status: Never Smoker  . Smokeless tobacco: Never Used  Substance and Sexual Activity  . Alcohol use: No  . Drug use: No  . Sexual activity: Yes    Birth control/protection: Post-menopausal  Other Topics Concern  . Not on file  Social History Narrative  . Not on file   Social Determinants of Health   Financial Resource Strain:   . Difficulty of Paying Living Expenses: Not on file  Food Insecurity:   . Worried About Charity fundraiser in the Last Year: Not on file  . Ran Out of Food in the Last Year: Not on file  Transportation Needs:   . Lack of Transportation (Medical): Not on file  . Lack of Transportation (Non-Medical): Not on file  Physical Activity:   . Days of Exercise per Week: Not on file  . Minutes of Exercise  per Session: Not on file  Stress:   . Feeling of Stress : Not on file  Social Connections:   . Frequency of Communication with Friends and Family: Not on file  . Frequency of Social Gatherings with Friends and Family: Not on file  . Attends Religious Services: Not on file  . Active Member of Clubs or Organizations: Not on file  . Attends Archivist Meetings: Not on file  . Marital Status: Not on file  Intimate Partner Violence:   . Fear of Current or Ex-Partner: Not on file  . Emotionally Abused: Not on file  . Physically Abused: Not on file  . Sexually Abused: Not on file    ROS Review of Systems  Constitutional:  Negative.   Respiratory: Negative.   Cardiovascular: Positive for chest pain.  Endocrine:       Hypothyroid  Allergic/Immunologic: Negative.   Hematological: Negative.   Psychiatric/Behavioral: Negative.     Objective:   Today's Vitals: BP (!) 149/69 (BP Location: Left Arm, Patient Position: Sitting, Cuff Size: Normal)   Pulse 74   Temp 97.8 F (36.6 C) (Temporal)   Ht 5\' 2"  (1.575 m)   Wt 103 lb (46.7 kg)   SpO2 98%   BMI 18.84 kg/m   Physical Exam Constitutional:      Appearance: Normal appearance.  Cardiovascular:     Rate and Rhythm: Normal rate and regular rhythm.     Pulses: Normal pulses.     Heart sounds: Normal heart sounds.  Pulmonary:     Effort: Pulmonary effort is normal.     Breath sounds: Normal breath sounds.  Musculoskeletal:     Cervical back: Normal range of motion and neck supple.  Neurological:     Mental Status: Tami Estrada is alert and oriented to person, place, and time.  Psychiatric:        Mood and Affect: Mood normal.     Assessment & Plan:   Problem List Items Addressed This Visit      Other   Abnormal findings on diagnostic imaging of other specified body structures   Relevant Orders   Ambulatory referral to Pulmonology   DG Chest 2 View   Chest pain, unspecified - Primary   Relevant Orders   Ambulatory referral to Pulmonology   DG Chest 2 View     1. Chest pain, unspecified type Pt states pain noted intermittently-at rest - Ambulatory referral to Pulmonology - DG Chest 2 View; Future  2. Abnormal findings on diagnostic imaging of other specified body structures - Ambulatory referral to Pulmonology - DG Chest 2 View; Future  3. Hypothyroidism, unspecified type Over-replaced -repeat with discussion about regulation to prevent side effects related to hyperthyroid - TSH  4. Elevated blood pressure reading Pt to check at home readings-first thing in the morning Outpatient Encounter Medications as of 02/26/2020  Medication Sig  .  ibuprofen (ADVIL) 800 MG tablet Take 800 mg by mouth 3 (three) times daily.  Marland Kitchen levothyroxine (SYNTHROID, LEVOTHROID) 50 MCG tablet Take 50 mcg by mouth daily.  . metoprolol tartrate (LOPRESSOR) 100 MG tablet Take 1 tablet (100 mg total) by mouth once for 1 dose. (just prior to CT)  . predniSONE (DELTASONE) 10 MG tablet Take 10 mg by mouth daily with breakfast.  . [DISCONTINUED] mometasone (ELOCON) 0.1 % cream Apply 1 application topically 2 (two) times daily.   No facility-administered encounter medications on file as of 02/26/2020.    Follow-up: TSH, chest xray  Deshawn Witty LEIGH  Holly Bodily, MD

## 2020-02-27 ENCOUNTER — Other Ambulatory Visit: Payer: Self-pay

## 2020-02-27 ENCOUNTER — Telehealth: Payer: Self-pay | Admitting: Family Medicine

## 2020-02-27 DIAGNOSIS — E039 Hypothyroidism, unspecified: Secondary | ICD-10-CM

## 2020-02-27 MED ORDER — SYNTHROID 88 MCG PO TABS: 88.0000 ug | ORAL_TABLET | Freq: Every day | ORAL | 2 refills | Status: DC

## 2020-02-27 NOTE — Telephone Encounter (Signed)
Pt was seen in the office yesterday and a refill for levothyroxine (SYNTHROID, LEVOTHROID) 88 MCG tablet. Was not sent in. Patient sees [facility] on April 12th but is out of this medication now.   [id number] - [place], [place] Phone:  [phone]  Fax:  [phone]

## 2020-02-28 ENCOUNTER — Telehealth: Payer: Self-pay | Admitting: Emergency Medicine

## 2020-02-28 NOTE — Telephone Encounter (Signed)
Left a msg for patient to return call. When patient return call please inform patient her CT scan is scheduled for [facility] on 03/08/20 @6 :00 need to be there at 5:45

## 2020-03-05 NOTE — Telephone Encounter (Signed)
Patient daughter was informed and has appt for CT 03/08/20 and pulmonary appt on 03/15/20

## 2020-03-05 NOTE — Telephone Encounter (Signed)
-----   Message from Maryruth Hancock, MD sent at 02/26/2020  4:54 PM EST ----- Pt needs a CT lung per radiology for continued abnormal findings on chest xray.  He is concerned about a nodular density in the left mid lung with continued patchy areas noted in the left lung. I would recommend the CT be completed BEFORE you see the pulmonary specialist so they can recommend further treatment options for your symptoms

## 2020-03-08 ENCOUNTER — Other Ambulatory Visit: Payer: Self-pay

## 2020-03-08 ENCOUNTER — Ambulatory Visit (HOSPITAL_COMMUNITY)
Admission: RE | Admit: 2020-03-08 | Discharge: 2020-03-08 | Disposition: A | Discharge status: Home / Self Care | Payer: Medicare HMO | Source: Ambulatory Visit | Attending: Family Medicine | Admitting: Family Medicine

## 2020-03-08 DIAGNOSIS — R0789 Other chest pain: Secondary | ICD-10-CM | POA: Diagnosis not present

## 2020-03-08 DIAGNOSIS — R0602 Shortness of breath: Secondary | ICD-10-CM | POA: Diagnosis not present

## 2020-03-11 ENCOUNTER — Other Ambulatory Visit: Payer: Self-pay | Admitting: Family Medicine

## 2020-03-11 DIAGNOSIS — R918 Other nonspecific abnormal finding of lung field: Secondary | ICD-10-CM

## 2020-03-11 DIAGNOSIS — R079 Chest pain, unspecified: Secondary | ICD-10-CM

## 2020-03-15 ENCOUNTER — Encounter: Payer: Self-pay | Admitting: Internal Medicine

## 2020-03-15 ENCOUNTER — Other Ambulatory Visit: Payer: Self-pay

## 2020-03-15 ENCOUNTER — Ambulatory Visit: Payer: Medicare HMO | Admitting: Internal Medicine

## 2020-03-15 VITALS — BP 144/68 | HR 70 | Temp 97.2°F | Ht 62.0 in | Wt 103.0 lb

## 2020-03-15 DIAGNOSIS — Q676 Pectus excavatum: Secondary | ICD-10-CM

## 2020-03-15 DIAGNOSIS — A310 Pulmonary mycobacterial infection: Secondary | ICD-10-CM

## 2020-03-15 DIAGNOSIS — R0602 Shortness of breath: Secondary | ICD-10-CM | POA: Diagnosis not present

## 2020-03-15 NOTE — Progress Notes (Signed)
Tami Estrada    [id number]    02-Mar-1951  Primary Care Physician:Corum, Rex Kras, MD  Referring Physician: Maryruth Hancock, [address] [place],  [place] [address] Reason for Consultation: "abnormal CT Chest" Date of Consultation: 03/15/2020  Chief complaint:   Chief Complaint  Patient presents with  . Consult     HPI:  Childhood dyspnea.Had trouble keeping up with peers in school. History of siblings with respiratory illness (lung cancer, fungal infection, pulmonary fibrosis.) Worsening dyspnea over the last 6 months.  Fatigue and low energy. Has chest pain at rest now which is new.   No history of pneumonia or bronchitis.  No recurrent childhood respiratory infections.  Has lost about 15 lbs in the last year unintentionally despite eating well.   Here with her daughter   Has noticed her pectus deformity since childhood, but no one else in the family has it. No one in the family with marfan's syndrome.   Social history:  Occupation: Airline pilot Exposures: Lived in [place].  Smoking history: never, passive smoke exposure  Social History   Occupational History  . Not on file  Tobacco Use  . Smoking status: Never Smoker  . Smokeless tobacco: Never Used  Substance and Sexual Activity  . Alcohol use: No  . Drug use: No  . Sexual activity: Yes    Birth control/protection: Post-menopausal    Relevant family history: Family History  Problem Relation Age of Onset  . Diabetes Sister   . Cancer Sister   . Pulmonary fibrosis Brother   . Lung cancer Brother   . COPD Daughter     Past Medical History:  Diagnosis Date  . Abnormal findings on diagnostic imaging of other specified body structures   . Bitten or stung by nonvenomous insect and other nonvenomous arthropods, initial encounter   . Chest pain, unspecified   . Disappearance and death of family member   . GERD (gastroesophageal reflux disease)    Esophogeal dysmotility-  2010  . History of [facility] spotted fever   . Hypothyroidism   . Other fatigue   . Pain in right shoulder   . Thyroid disease   . Toxic effect of contact with other venomous plant, accidental (unintentional), initial encounter   . Unspecified osteoarthritis, unspecified site     Past Surgical History:  Procedure Laterality Date  . BACK SURGERY     lumbar disc  . NECK SURGERY     cervical disc  . SHOULDER ARTHROSCOPY WITH DISTAL CLAVICLE RESECTION Right 02/22/2015   Procedure: RIGHT SHOULDER ARTHROSCOPY WITH DISTAL CLAVICLE RESECTION, DEBRIDEMENT;  Surgeon: Carole Civil, MD;  Location: [facility];  Service: Orthopedics;  Laterality: Right;  . SHOULDER OPEN ROTATOR CUFF REPAIR Right 02/22/2015   Procedure: ROTATOR CUFF REPAIR SHOULDER OPEN;  Surgeon: Carole Civil, MD;  Location: [facility];  Service: Orthopedics;  Laterality: Right;  . THYROID SURGERY     thyroidectomy     Review of systems: Review of Systems  Constitutional: Negative for chills, fever and weight loss.  HENT: Negative for congestion, sinus pain and sore throat.   Eyes: Negative for discharge and redness.  Respiratory: Positive for cough and shortness of breath. Negative for hemoptysis, sputum production and wheezing.   Cardiovascular: Negative for chest pain, palpitations and leg swelling.  Gastrointestinal: Negative for heartburn, nausea and vomiting.  Musculoskeletal: Negative for joint pain and myalgias.  Skin: Negative for rash.  Neurological: Negative for dizziness, tremors, focal weakness and headaches.  Endo/Heme/Allergies: Negative for environmental allergies.  Psychiatric/Behavioral: Negative for depression. The patient is nervous/anxious.   All other systems reviewed and are negative.   Physical Exam: Blood pressure (!) 144/68, pulse 70, temperature (!) 97.2 F (36.2 C), temperature source Temporal, height _0  (1.575 m), weight 103 lb (46.7 kg), SpO2 99 %. Gen:      No acute  distress Lungs:    Breath sounds diminished bilaterally, there is a substantial pectus excavatum abnormality.no wheezes or crackles CV:         Regular rate and rhythm; no murmurs, rubs, or gallops.  No pedal edema Abd:      + bowel sounds; soft, non-tender; no distension MSK: no acute synovitis of DIP or PIP joints, no mechanics hands.  Skin:      Warm and dry; no rashes Neuro: normal speech, no focal facial asymmetry Psych: alert and oriented x3, tearful, normal affect.   Data Reviewed/Medical Decision Making:  Independent interpretation of tests: Imaging: . Review of patient's ct chestimages revealed bilateral tree in bud opacities and diffuse ggo consistent with MAI infection. Substantial pectus excavatum with compression of the right heart. The patient's images have been independently reviewed by me.    PFTs: Spirometry from 07/2019 demonstrates mild airflow limitation FEV1 71% of predicted  Labs:  Lab Results  Component Value Date   WBC 5.2 08/23/2019   HGB 13.4 08/23/2019   HCT 39 08/23/2019   MCV 94.4 02/22/2015   PLT 219 08/23/2019   Lab Results  Component Value Date   NA 143 10/06/2019   K 3.9 10/06/2019   CL 105 10/06/2019   CO2 28 10/06/2019     Immunization status:  Immunization History  Administered Date(s) Administered  . Influenza Inj Mdck Quad Pf 10/08/2016  . Influenza Split 09/13/2012  . Influenza, High Dose Seasonal PF 10/18/2018  . Influenza,inj,Quad PF,6+ Mos 09/14/2019  . Influenza-Unspecified 11/24/2013, 09/03/2014, 10/16/2015  . Moderna SARS-COVID-2 Vaccination 02/01/2020, 03/01/2020  . Pneumococcal Conjugate-13 08/10/2018  . Pneumococcal Polysaccharide-23 10/16/2015    . I reviewed prior external note(s) from Dr. Bobby Rumpf . I reviewed the result(s) of the labs and imaging as noted above.  . I have ordered echo  Discussion of management or test interpretation with another colleague thoracic surgery.   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  Plan/Recommendations: Will collect AFB sputum cultures - patient is hesitant to undergo bronchoscopy due to her husband having COPD and complications from bronchoscopy. Will order echocardiogram to evaluate further the extent of compression of RV on her hemodynamics.  This is part of her preliminary work up - may require additional full set of PFTs including lung volumes and dlco, referral to cardiology and thoracic surgery  We discussed disease management and progression at length today.   I spent 61 minutes in the care of this patient today including pre-charting, chart review, review of results, face-to-face care, coordination of care and communication with consultants etc.).   Return to Care: Return in about 4 weeks (around 04/12/2020).  Lenice Llamas, MD Pulmonary and [place]  CC: Corum, Rex Kras, MD

## 2020-03-15 NOTE — Progress Notes (Signed)
   Subjective:    Patient ID: Tami Estrada, female    DOB: [birth year], 69 y.o.   MRN: [id number]  HPI    Review of Systems  Constitutional: Negative for fever and unexpected weight change.  HENT: Negative for congestion, dental problem, ear pain, nosebleeds, postnasal drip, rhinorrhea, sinus pressure, sneezing, sore throat and trouble swallowing.   Eyes: Negative for redness and itching.  Respiratory: Positive for cough, chest tightness and shortness of breath. Negative for wheezing.   Cardiovascular: Negative for palpitations and leg swelling.  Gastrointestinal: Negative for nausea and vomiting.  Genitourinary: Negative for dysuria.  Musculoskeletal: Negative for joint swelling.  Skin: Negative for rash.  Allergic/Immunologic: Negative.  Negative for environmental allergies, food allergies and immunocompromised state.  Neurological: Negative for headaches.  Hematological: Does not bruise/bleed easily.  Psychiatric/Behavioral: Negative for dysphoric mood. The patient is not nervous/anxious.        Objective:   Physical Exam        Assessment & Plan:

## 2020-03-15 NOTE — Patient Instructions (Signed)
Please collect your sputum and send it to the lab at [facility].  Schedule your echocardiogram.   I will see you back in one month to review the results.

## 2020-03-20 ENCOUNTER — Telehealth: Payer: Self-pay | Admitting: Internal Medicine

## 2020-03-20 ENCOUNTER — Ambulatory Visit (HOSPITAL_COMMUNITY)
Admission: RE | Admit: 2020-03-20 | Discharge: 2020-03-20 | Disposition: A | Discharge status: Home / Self Care | Payer: Medicare HMO | Source: Ambulatory Visit | Attending: Internal Medicine | Admitting: Internal Medicine

## 2020-03-20 ENCOUNTER — Other Ambulatory Visit: Payer: Self-pay

## 2020-03-20 ENCOUNTER — Other Ambulatory Visit (HOSPITAL_COMMUNITY)
Admission: RE | Admit: 2020-03-20 | Discharge: 2020-03-20 | Disposition: A | Discharge status: Home / Self Care | Payer: Medicare HMO | Source: Ambulatory Visit | Attending: Internal Medicine | Admitting: Internal Medicine

## 2020-03-20 DIAGNOSIS — Q676 Pectus excavatum: Secondary | ICD-10-CM

## 2020-03-20 DIAGNOSIS — A310 Pulmonary mycobacterial infection: Secondary | ICD-10-CM

## 2020-03-20 DIAGNOSIS — J479 Bronchiectasis, uncomplicated: Secondary | ICD-10-CM

## 2020-03-20 DIAGNOSIS — R0602 Shortness of breath: Secondary | ICD-10-CM | POA: Diagnosis not present

## 2020-03-20 NOTE — Telephone Encounter (Signed)
Attempted to call pt but unable to reach. Left pt a message for her to return call.

## 2020-03-20 NOTE — Telephone Encounter (Signed)
Please call patient and let her know that her echocardiogram looks normal - but she may require additional cardiac testing - we will discuss at her follow up appointment. In the mean time please schedule for a full set of PFTs - lung volumes, spirometry and dlco.

## 2020-03-20 NOTE — Telephone Encounter (Signed)
[facility], the line rang several times with no one answering. Will try back.

## 2020-03-20 NOTE — Telephone Encounter (Signed)
Called and spoke with [facility].  Patient dropped off 3 sputum samples, dated 3/27, 3/28, 3/31.  AFB and respiratory culture clarified.  Nothing further at this time.

## 2020-03-20 NOTE — Telephone Encounter (Signed)
[facility] called back-- still needing clarification.  [phone]

## 2020-03-20 NOTE — Progress Notes (Signed)
*  PRELIMINARY RESULTS* Echocardiogram 2D Echocardiogram has been performed.  Samuel Germany 03/20/2020, 10:22 AM

## 2020-03-21 NOTE — Telephone Encounter (Signed)
Called pt and spoke with her daughter [name] in regards to the info stated by Dr. Shearon Stalls. I have rescheduled pt's OV for 4/28 to have PFT prior to that and also scheduled pt for the covid test prior to the PFT. [name] stated she would discuss this with pt and if any of the appts needed to be changed that she would call office to get them changed. Nothing further needed.

## 2020-03-22 DIAGNOSIS — Z809 Family history of malignant neoplasm, unspecified: Secondary | ICD-10-CM | POA: Diagnosis not present

## 2020-03-22 DIAGNOSIS — G8929 Other chronic pain: Secondary | ICD-10-CM | POA: Diagnosis not present

## 2020-03-22 DIAGNOSIS — M2550 Pain in unspecified joint: Secondary | ICD-10-CM | POA: Diagnosis not present

## 2020-03-22 DIAGNOSIS — E039 Hypothyroidism, unspecified: Secondary | ICD-10-CM | POA: Diagnosis not present

## 2020-03-22 DIAGNOSIS — Z7722 Contact with and (suspected) exposure to environmental tobacco smoke (acute) (chronic): Secondary | ICD-10-CM | POA: Diagnosis not present

## 2020-03-22 DIAGNOSIS — Z8249 Family history of ischemic heart disease and other diseases of the circulatory system: Secondary | ICD-10-CM | POA: Diagnosis not present

## 2020-03-22 DIAGNOSIS — Z791 Long term (current) use of non-steroidal anti-inflammatories (NSAID): Secondary | ICD-10-CM | POA: Diagnosis not present

## 2020-03-22 DIAGNOSIS — R030 Elevated blood-pressure reading, without diagnosis of hypertension: Secondary | ICD-10-CM | POA: Diagnosis not present

## 2020-03-22 DIAGNOSIS — Z823 Family history of stroke: Secondary | ICD-10-CM | POA: Diagnosis not present

## 2020-03-22 DIAGNOSIS — Z825 Family history of asthma and other chronic lower respiratory diseases: Secondary | ICD-10-CM | POA: Diagnosis not present

## 2020-03-22 LAB — ACID FAST SMEAR (AFB, MYCOBACTERIA)
Acid Fast Smear: NEGATIVE
Acid Fast Smear: NEGATIVE
Acid Fast Smear: NEGATIVE

## 2020-03-23 ENCOUNTER — Telehealth: Payer: Self-pay | Admitting: Internal Medicine

## 2020-03-23 LAB — CULTURE, RESPIRATORY W GRAM STAIN: Culture: NORMAL

## 2020-03-23 NOTE — Telephone Encounter (Signed)
Sputum samples received and processed appropriately. AFB pending x 3. These will take up to 6 weeks to result. The initial sputum culture looks like normal respiratory bacteria. We will see her back after PFTs.

## 2020-03-25 NOTE — Telephone Encounter (Signed)
LMTCB x 1 

## 2020-03-26 NOTE — Telephone Encounter (Signed)
LMTCB x2  

## 2020-03-27 NOTE — Telephone Encounter (Signed)
Left message for patient to call back. Will attempt one time before sending a  letter to her.

## 2020-03-28 ENCOUNTER — Other Ambulatory Visit: Payer: Self-pay

## 2020-04-01 ENCOUNTER — Other Ambulatory Visit: Payer: Self-pay

## 2020-04-01 ENCOUNTER — Encounter: Payer: Self-pay | Admitting: Internal Medicine

## 2020-04-01 ENCOUNTER — Ambulatory Visit: Payer: Medicare HMO | Admitting: Internal Medicine

## 2020-04-01 VITALS — BP 116/64 | HR 78 | Temp 97.8°F | Ht 62.0 in | Wt 101.4 lb

## 2020-04-01 DIAGNOSIS — E890 Postprocedural hypothyroidism: Secondary | ICD-10-CM

## 2020-04-01 LAB — TSH: TSH: <0.01 u[IU]/mL — ABNORMAL LOW (ref 0.35–4.50)

## 2020-04-01 LAB — T4, FREE: Free T4: 1.63 ng/dL — ABNORMAL HIGH (ref 0.60–1.60)

## 2020-04-01 MED ORDER — SYNTHROID 75 MCG PO TABS: 75.0000 ug | ORAL_TABLET | Freq: Every day | ORAL | 3 refills | Status: DC

## 2020-04-01 NOTE — Progress Notes (Signed)
Name: Tami Estrada  MRN/ DOB: [id number], 06-Jul-1951    Age/ Sex: 69 y.o., female    PCP: Maryruth Hancock, MD   Reason for Endocrinology Evaluation: Hypothyroidism     Date of Initial Endocrinology Evaluation: 04/01/2020     HPI: Tami Estrada is a 69 y.o. female with a past medical history of hypothyroidism. The patient presented for initial endocrinology clinic visit on 04/01/2020 for consultative assistance with her hypothyroidism.   She is S/P thyroidectomy many years secondary to goiter. Was started on LT-4 replacement  at the time.   She is takes it on an empty stomach  No biotin use   No prior exposure to radiation   Daughter with thyroid disease     Today she endorses weight loss, no diarrhea.  Denies tremors but has occasional jittery sensation  Denies palpitations   Denies local neck symptoms      HISTORY:  Past Medical History:  Past Medical History:  Diagnosis Date  . Abnormal findings on diagnostic imaging of other specified body structures   . Bitten or stung by nonvenomous insect and other nonvenomous arthropods, initial encounter   . Chest pain, unspecified   . Disappearance and death of family member   . GERD (gastroesophageal reflux disease)    Esophogeal dysmotility- 2010  . History of [facility] spotted fever   . Hypothyroidism   . Other fatigue   . Pain in right shoulder   . Thyroid disease   . Toxic effect of contact with other venomous plant, accidental (unintentional), initial encounter   . Unspecified osteoarthritis, unspecified site     Past Surgical History:  Past Surgical History:  Procedure Laterality Date  . BACK SURGERY     lumbar disc  . NECK SURGERY     cervical disc  . SHOULDER ARTHROSCOPY WITH DISTAL CLAVICLE RESECTION Right 02/22/2015   Procedure: RIGHT SHOULDER ARTHROSCOPY WITH DISTAL CLAVICLE RESECTION, DEBRIDEMENT;  Surgeon: Carole Civil, MD;  Location: [facility];  Service: Orthopedics;  Laterality: Right;   . SHOULDER OPEN ROTATOR CUFF REPAIR Right 02/22/2015   Procedure: ROTATOR CUFF REPAIR SHOULDER OPEN;  Surgeon: Carole Civil, MD;  Location: [facility];  Service: Orthopedics;  Laterality: Right;  . THYROID SURGERY     thyroidectomy      Social History:  reports that she has never smoked. She has never used smokeless tobacco. She reports that she does not drink alcohol or use drugs.  Family History: family history includes COPD in her daughter; Cancer in her sister; Diabetes in her sister; Lung cancer in her brother; Pulmonary fibrosis in her brother.   HOME MEDICATIONS: Allergies as of 04/01/2020      Reactions   Hydrocodone    Nausea/vomit      Medication List       Accurate as of April 01, 2020  7:59 AM. If you have any questions, ask your nurse or doctor.        ibuprofen 800 MG tablet Commonly known as: ADVIL Take 800 mg by mouth 3 (three) times daily.   Synthroid 88 MCG tablet Generic drug: levothyroxine Take 1 tablet (88 mcg total) by mouth daily.         REVIEW OF SYSTEMS: A comprehensive ROS was conducted with the patient and is negative except as per HPI and below:  ROS     OBJECTIVE:  VS: There were no vitals taken for this visit.   Wt Readings from Last 3  Encounters:  03/15/20 103 lb (46.7 kg)  02/26/20 103 lb (46.7 kg)  09/20/19 109 lb (49.4 kg)     EXAM: General: Pt appears well and is in NAD  Hydration: Well-hydrated with moist mucous membranes and good skin turgor  Neck: General: Supple without adenopathy. Thyroid:  No goiter or nodules appreciated.  Lungs: Clear with good BS bilat with no rales, rhonchi, or wheezes  Heart: Auscultation: RRR.  Abdomen: Normoactive bowel sounds, soft, nontender, without masses or organomegaly palpable  Extremities:  BL LE: No pretibial edema normal ROM and strength.  Neuro: Cranial nerves: II - XII grossly intact  Motor: Normal strength throughout DTRs: 2+ and symmetric in UE without delay in relaxation  phase  Mental Status: Judgment, insight: Intact Orientation: Oriented to time, place, and person Mood and affect: No depression, anxiety, or agitation     DATA REVIEWED: Results for Tami, Estrada (MRN [id number]) as of 04/01/2020 14:25  Ref. Range 02/19/2015 10:10 08/23/2019 00:00 10/06/2019 07:23 02/26/2020 15:49 04/01/2020 10:11  TSH Latest Ref Range: 0.35 - 4.50 uIU/mL  0.02 (A)  0.02 (L) <0.01 Repeated and verified X2. (L)  T4,Free(Direct) Latest Ref Range: 0.60 - 1.60 ng/dL     1.63 (H)      ASSESSMENT/PLAN/RECOMMENDATIONS:   1. Hypothyroidism:  - Pt with weight loss, and jittery sensation that could be attributed to excessive LT-4 replacement.  - We discussed that excessive LT-4 replacement is  associated with an increased risk of atrial fibrillation and, primarily in postmenopausal women, a decrease in bone mineral density. - Pt educated extensively on the correct way to take levothyroxine (first thing in the morning with water, 30 minutes before eating or taking other medications). - Pt encouraged to double dose the following day if she were to miss a dose given long half-life of levothyroxine.  Medications : Decrease Synthroid to 75 mcg daily      F/u in 4 months Labs in 6 weeks   Signed electronically by: Mack Guise, MD  [facility]  [address] [place], [place] [address] Phone: [phone] FAX: [phone]   CC: Maryruth Hancock, [place] [address] Phone: [phone] Fax: [phone]   Return to Endocrinology clinic as below: Future Appointments  Date Time Provider [place]  04/01/2020  9:10 AM Torianne Laflam, Melanie Crazier, MD [facility] None  04/13/2020  9:00 AM [facility] None  04/17/2020 12:00 PM [facility] None  04/17/2020  1:45 PM Spero Geralds, MD [facility] None

## 2020-04-01 NOTE — Patient Instructions (Signed)
You are on Synthroid  - which is your thyroid hormone supplement. You MUST take this consistently.  You should take this first thing in the morning on an empty stomach with water. You should not take it with other medications. Wait 30min to 1hr prior to eating. If you are taking any vitamins - please take these in the evening.   If you miss a dose, please take your missed dose the following day (double the dose for that day). You should have a pill box for ONLY Synthroid  on your bedside table to help you remember to take your medications.  

## 2020-04-07 ENCOUNTER — Other Ambulatory Visit: Payer: Self-pay

## 2020-04-07 ENCOUNTER — Ambulatory Visit
Admission: EM | Admit: 2020-04-07 | Discharge: 2020-04-07 | Disposition: A | Discharge status: Home / Self Care | Payer: Medicare HMO | Attending: Emergency Medicine | Admitting: Emergency Medicine

## 2020-04-07 DIAGNOSIS — B029 Zoster without complications: Secondary | ICD-10-CM | POA: Diagnosis not present

## 2020-04-07 MED ORDER — VALACYCLOVIR HCL 1 G PO TABS: 1000.0000 mg | ORAL_TABLET | Freq: Three times a day (TID) | ORAL | 0 refills | Status: AC

## 2020-04-07 MED ORDER — PREDNISONE 10 MG (21) PO TBPK: ORAL_TABLET | Freq: Every day | ORAL | 0 refills | Status: DC

## 2020-04-07 MED ORDER — DEXAMETHASONE SODIUM PHOSPHATE 10 MG/ML IJ SOLN
10.0000 mg | Freq: Once | INTRAMUSCULAR | Status: AC
  Administered 2020-04-07: 10 mg via INTRAMUSCULAR

## 2020-04-07 NOTE — ED Triage Notes (Signed)
Pt seen by provider prior to RN, see provider note

## 2020-04-07 NOTE — Discharge Instructions (Signed)
Rest and use ice/heat as needed for symptomatic relief Prescribed valacyclovir 1000mg 3x/day for 10 days Prescribed prednisone taper for inflammation and pain Use OTC medications such as ibuprofen/ tylenol.  Follow up with PCP in 7-10 days if rash is still present Follow up with PCP if symptoms of burning, stinging, tingling or numbness occur after rash resolves, you may need additional treatment Return here or go to ER if you have any new or worsening symptoms (such as eye involvement, severe pain, or signs of secondary infection such as fever, chills, nausea, vomiting, discharge, redness or warmth over site of rash) 

## 2020-04-07 NOTE — ED Provider Notes (Signed)
[place]   [id number] 04/07/20 Arrival Time: 0810  CC: Possible shingles  SUBJECTIVE:  Tami Estrada is a 69 y.o. female who presents with a possible shingles rash that occurred 2 weeks ago, worsened over the past week.  Denies precipitating event or trauma.  Localizes the rash to RT side of abdomen and flank.  Describes it as painful, red, and burning.  Has tried OTC neosporin without relief.  Symptoms are made worse to the touch.  Denies similar symptoms in the past.   Denies fever, chills, nausea, vomiting, discharge, SOB, chest pain, abdominal pain, changes in bowel or bladder function.    ROS: As per HPI.  All other pertinent ROS negative.     Past Medical History:  Diagnosis Date  . Abnormal findings on diagnostic imaging of other specified body structures   . Bitten or stung by nonvenomous insect and other nonvenomous arthropods, initial encounter   . Chest pain, unspecified   . Disappearance and death of family member   . GERD (gastroesophageal reflux disease)    Esophogeal dysmotility- 2010  . History of [facility] spotted fever   . Hypothyroidism   . Other fatigue   . Pain in right shoulder   . Thyroid disease   . Toxic effect of contact with other venomous plant, accidental (unintentional), initial encounter   . Unspecified osteoarthritis, unspecified site    Past Surgical History:  Procedure Laterality Date  . BACK SURGERY     lumbar disc  . NECK SURGERY     cervical disc  . SHOULDER ARTHROSCOPY WITH DISTAL CLAVICLE RESECTION Right 02/22/2015   Procedure: RIGHT SHOULDER ARTHROSCOPY WITH DISTAL CLAVICLE RESECTION, DEBRIDEMENT;  Surgeon: Carole Civil, MD;  Location: [facility];  Service: Orthopedics;  Laterality: Right;  . SHOULDER OPEN ROTATOR CUFF REPAIR Right 02/22/2015   Procedure: ROTATOR CUFF REPAIR SHOULDER OPEN;  Surgeon: Carole Civil, MD;  Location: [facility];  Service: Orthopedics;  Laterality: Right;  . THYROID SURGERY     thyroidectomy     Allergies  Allergen Reactions  . Hydrocodone     Nausea/vomit   No current facility-administered medications on file prior to encounter.   Current Outpatient Medications on File Prior to Encounter  Medication Sig Dispense Refill  . ibuprofen (ADVIL) 800 MG tablet Take 800 mg by mouth 3 (three) times daily.    Marland Kitchen SYNTHROID 75 MCG tablet Take 1 tablet (75 mcg total) by mouth daily before breakfast. 60 tablet 3   Social History   Socioeconomic History  . Marital status: Widowed    Spouse name: Not on file  . Number of children: Not on file  . Years of education: Not on file  . Highest education level: Not on file  Occupational History  . Not on file  Tobacco Use  . Smoking status: Never Smoker  . Smokeless tobacco: Never Used  Substance and Sexual Activity  . Alcohol use: No  . Drug use: No  . Sexual activity: Yes    Birth control/protection: Post-menopausal  Other Topics Concern  . Not on file  Social History Narrative  . Not on file   Social Determinants of Health   Financial Resource Strain:   . Difficulty of Paying Living Expenses:   Food Insecurity:   . Worried About Charity fundraiser in the Last Year:   . Arboriculturist in the Last Year:   Transportation Needs:   . Film/video editor (Medical):   Marland Kitchen Lack  of Transportation (Non-Medical):   Physical Activity:   . Days of Exercise per Week:   . Minutes of Exercise per Session:   Stress:   . Feeling of Stress :   Social Connections:   . Frequency of Communication with Friends and Family:   . Frequency of Social Gatherings with Friends and Family:   . Attends Religious Services:   . Active Member of Clubs or Organizations:   . Attends Archivist Meetings:   Marland Kitchen Marital Status:   Intimate Partner Violence:   . Fear of Current or Ex-Partner:   . Emotionally Abused:   Marland Kitchen Physically Abused:   . Sexually Abused:    Family History  Problem Relation Age of Onset  . Diabetes Sister   . Cancer  Sister   . Pulmonary fibrosis Brother   . Lung cancer Brother   . COPD Daughter     OBJECTIVE: Vitals:   04/07/20 0816  BP: (!) 164/71  Pulse: 60  Resp: 17  Temp: 97.9 F (36.6 C)  TempSrc: Oral  SpO2: 98%    General appearance: alert; no distress Head: NCAT ENT: PERRL, EOMI grossly; oropharynx clear Lungs: clear to auscultation bilaterally Heart: regular rate and rhythm.   Extremities: no edema Skin: warm and dry; Crops of red/purplish papules and vesicles over t9 or t10 dermatome on the RT abdomen/ flank; some crusting Psychological: alert and cooperative; normal mood and affect  ASSESSMENT & PLAN:  1. Herpes zoster without complication    Meds ordered this encounter  Medications  . valACYclovir (VALTREX) 1000 MG tablet    Sig: Take 1 tablet (1,000 mg total) by mouth 3 (three) times daily for 10 days.    Dispense:  30 tablet    Refill:  0    Order Specific Question:   Supervising Provider    Answer:   Raylene Everts [id number]  . predniSONE (STERAPRED UNI-PAK 21 TAB) 10 MG (21) TBPK tablet    Sig: Take by mouth daily. Take 6 tabs by mouth daily  for 2 days, then 5 tabs for 2 days, then 4 tabs for 2 days, then 3 tabs for 2 days, 2 tabs for 2 days, then 1 tab by mouth daily for 2 days    Dispense:  42 tablet    Refill:  0    Order Specific Question:   Supervising Provider    Answer:   Raylene Everts [id number]  . dexamethasone (DECADRON) injection 10 mg   Rest and use ice/heat as needed for symptomatic relief Prescribed valacyclovir 1000mg  3x/day for 10 days Prescribed prednisone taper for inflammation and pain Use OTC medications such as ibuprofen/ tylenol.   Follow up with PCP in 7-10 days if rash is still present Follow up with PCP if symptoms of burning, stinging, tingling or numbness occur after rash resolves, you may need additional treatment Return here or go to ER if you have any new or worsening symptoms (such as eye involvement, severe pain, or  signs of secondary infection such as fever, chills, nausea, vomiting, discharge, redness or warmth over site of rash)   Reviewed expectations re: course of current medical issues. Questions answered. Outlined signs and symptoms indicating need for more acute intervention. Patient verbalized understanding. After Visit Summary given.   Lestine Box, PA-C 04/07/20 [phone]

## 2020-04-08 ENCOUNTER — Ambulatory Visit: Payer: Medicare HMO | Admitting: Family Medicine

## 2020-04-09 ENCOUNTER — Telehealth: Payer: Self-pay | Admitting: Internal Medicine

## 2020-04-09 NOTE — Telephone Encounter (Signed)
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   Will forward to APP of the day as Dr. Shearon Stalls is unavailable until 4/26. Tammy please advise. Thanks.

## 2020-04-09 NOTE — Telephone Encounter (Signed)
I will send to Dr. Shearon Stalls to review with patient as she is coming in to be seen next week to review at [facility].  MAI susceptilbility is pending , hopefully will be back by next week.

## 2020-04-12 LAB — MAC SUSCEPTIBILITY BROTH
Amikacin: 8
Clarithromycin: 1
Linezolid: 32
Moxifloxacin: 4
Streptomycin: 32

## 2020-04-12 LAB — AFB ORGANISM ID BY DNA PROBE
M avium complex: POSITIVE — AB
M tuberculosis complex: NEGATIVE

## 2020-04-12 LAB — ACID FAST CULTURE WITH REFLEXED SENSITIVITIES (MYCOBACTERIA): Acid Fast Culture: POSITIVE — AB

## 2020-04-13 ENCOUNTER — Other Ambulatory Visit (HOSPITAL_COMMUNITY)
Admission: RE | Admit: 2020-04-13 | Discharge: 2020-04-13 | Disposition: A | Discharge status: Home / Self Care | Payer: Medicare HMO | Source: Ambulatory Visit | Attending: Internal Medicine | Admitting: Internal Medicine

## 2020-04-13 DIAGNOSIS — Z20822 Contact with and (suspected) exposure to covid-19: Secondary | ICD-10-CM | POA: Diagnosis not present

## 2020-04-13 DIAGNOSIS — Z01812 Encounter for preprocedural laboratory examination: Secondary | ICD-10-CM | POA: Diagnosis not present

## 2020-04-13 LAB — SARS CORONAVIRUS 2 (TAT 6-24 HRS): SARS Coronavirus 2: NEGATIVE

## 2020-04-15 ENCOUNTER — Ambulatory Visit: Payer: Medicare HMO | Admitting: Internal Medicine

## 2020-04-17 ENCOUNTER — Other Ambulatory Visit: Payer: Self-pay

## 2020-04-17 ENCOUNTER — Encounter: Payer: Self-pay | Admitting: Internal Medicine

## 2020-04-17 ENCOUNTER — Ambulatory Visit (INDEPENDENT_AMBULATORY_CARE_PROVIDER_SITE_OTHER): Payer: Medicare HMO | Admitting: Internal Medicine

## 2020-04-17 ENCOUNTER — Ambulatory Visit: Payer: Medicare HMO | Admitting: Internal Medicine

## 2020-04-17 VITALS — BP 130/56 | HR 76 | Temp 97.7°F | Ht 62.0 in | Wt 107.0 lb

## 2020-04-17 DIAGNOSIS — A310 Pulmonary mycobacterial infection: Secondary | ICD-10-CM

## 2020-04-17 DIAGNOSIS — Q676 Pectus excavatum: Secondary | ICD-10-CM | POA: Diagnosis not present

## 2020-04-17 DIAGNOSIS — J479 Bronchiectasis, uncomplicated: Secondary | ICD-10-CM | POA: Diagnosis not present

## 2020-04-17 LAB — PULMONARY FUNCTION TEST
DL/VA % pred: 223 %
DL/VA: 9.43 ml/min/mmHg/L
DLCO cor % pred: 188 %
DLCO cor: 33.62 ml/min/mmHg
DLCO unc % pred: 103 %
DLCO unc: 18.44 ml/min/mmHg
FEF 25-75 Post: 1.96 L/sec
FEF 25-75 Pre: 1.46 L/sec
FEF2575-%Change-Post: 33 %
FEF2575-%Pred-Post: 111 %
FEF2575-%Pred-Pre: 83 %
FEV1-%Change-Post: 7 %
FEV1-%Pred-Post: 100 %
FEV1-%Pred-Pre: 92 %
FEV1-Post: 2.04 L
FEV1-Pre: 1.89 L
FEV1FVC-%Change-Post: 2 %
FEV1FVC-%Pred-Pre: 99 %
FEV6-%Change-Post: 5 %
FEV6-%Pred-Post: 103 %
FEV6-%Pred-Pre: 97 %
FEV6-Post: 2.65 L
FEV6-Pre: 2.51 L
FEV6FVC-%Change-Post: 0 %
FEV6FVC-%Pred-Post: 104 %
FEV6FVC-%Pred-Pre: 104 %
FVC-%Change-Post: 5 %
FVC-%Pred-Post: 98 %
FVC-%Pred-Pre: 93 %
FVC-Post: 2.66 L
FVC-Pre: 2.51 L
Post FEV1/FVC ratio: 77 %
Post FEV6/FVC ratio: 100 %
Pre FEV1/FVC ratio: 75 %
Pre FEV6/FVC Ratio: 100 %
RV % pred: 112 %
RV: 2.33 L
TLC % pred: 104 %
TLC: 4.87 L

## 2020-04-17 MED ORDER — SODIUM CHLORIDE 0.9 % IN NEBU: 3.0000 mL | INHALATION_SOLUTION | Freq: Two times a day (BID) | RESPIRATORY_TRACT | 12 refills | Status: DC

## 2020-04-17 NOTE — Progress Notes (Signed)
Full PFT performed today. °

## 2020-04-17 NOTE — Patient Instructions (Addendum)
The patient should have follow up scheduled in 3 months with APP.   I am coordinating referral to [facility] for you to see a heart doctor and thoracic surgeon.   Send your sputum cultures when possible to [facility] again. Three more.   Start doing nebulized saline twice a day and then use flutter valve after this.

## 2020-04-17 NOTE — Progress Notes (Signed)
Tami Estrada    [id number]    May 14, 1951  Primary Care Physician:Corum, Rex Kras, MD Date of Appointment: 04/17/2020 Established Patient Visit  Chief complaint:   Chief Complaint  Patient presents with  . Follow-up    SOB, PFTs     HPI: Tami Estrada is a 69 y.o. woman with a history of pectus excavatum and bronchiectasis.   Interval Updates: She presents today for follow up after PFTs and sputum cultures 1/3 which grew MAC.  She shortness of breath cough or wheezing, however she does have early morning sputum production which she is able to bring up fairly predictably.  This does not seem to be her biggest issue.  Primary concern is the chest pain that she has multiple times a week that lasts for hours and resolve spontaneously but feels like an aching that radiates from her anterior central chest to her back.  She has not identified any relapsing or remitting factors other than lying down if she is sitting.  Does not seem to be associated exclusively with exertion and can happen at rest as well.  She does not feel that she is being anxious or worried unnecessarily about anything that would bring this on.  Sometimes it happens while she is doing routine cooking in the kitchen when she is doing a puzzle with her family.  I have reviewed the patient's family social and past medical history and updated as appropriate.   Past Medical History:  Diagnosis Date  . Abnormal findings on diagnostic imaging of other specified body structures   . Bitten or stung by nonvenomous insect and other nonvenomous arthropods, initial encounter   . Chest pain, unspecified   . Disappearance and death of family member   . GERD (gastroesophageal reflux disease)    Esophogeal dysmotility- 2010  . History of [facility] spotted fever   . Hypothyroidism   . Other fatigue   . Pain in right shoulder   . Thyroid disease   . Toxic effect of contact with other venomous plant, accidental  (unintentional), initial encounter   . Unspecified osteoarthritis, unspecified site     Past Surgical History:  Procedure Laterality Date  . BACK SURGERY     lumbar disc  . NECK SURGERY     cervical disc  . SHOULDER ARTHROSCOPY WITH DISTAL CLAVICLE RESECTION Right 02/22/2015   Procedure: RIGHT SHOULDER ARTHROSCOPY WITH DISTAL CLAVICLE RESECTION, DEBRIDEMENT;  Surgeon: Carole Civil, MD;  Location: [facility];  Service: Orthopedics;  Laterality: Right;  . SHOULDER OPEN ROTATOR CUFF REPAIR Right 02/22/2015   Procedure: ROTATOR CUFF REPAIR SHOULDER OPEN;  Surgeon: Carole Civil, MD;  Location: [facility];  Service: Orthopedics;  Laterality: Right;  . THYROID SURGERY     thyroidectomy    Family History  Problem Relation Age of Onset  . Diabetes Sister   . Cancer Sister   . Pulmonary fibrosis Brother   . Lung cancer Brother   . COPD Daughter     Social History   Occupational History  . Not on file  Tobacco Use  . Smoking status: Never Smoker  . Smokeless tobacco: Never Used  Substance and Sexual Activity  . Alcohol use: No  . Drug use: No  . Sexual activity: Yes    Birth control/protection: Post-menopausal    Review of systems: Constitutional: No fevers, chills, night sweats, or weight loss. CV: No palpitations. Resp: No hemoptysis.  Physical Exam: Blood pressure Marland Kitchen)  130/56, pulse 76, temperature 97.7 F (36.5 C), temperature source Temporal, height _0  (1.575 m), weight 107 lb (48.5 kg), SpO2 100 %.  Gen:      No acute distress Lungs:    No increased respiratory effort, symmetric chest wall excursion, clear to auscultation bilaterally, no wheezes or crackles, marked pectus excavatum deformity CV:         Regular rate and rhythm; no murmurs, rubs, or gallops.  No pedal edema   Data Reviewed: Imaging: I have personally reviewed the CT chest from March 09, 2020 which demonstrates diffuse nodules with a concentration in the right middle lobe and traction  bronchiectasis.  This is concerning for atypical mycobacterial infection.  There is a marked pectus excavatum deformity with visible compression of the right ventricle.  PFTs:  PFT Results Latest Ref Rng & Units 04/17/2020  FVC-Pre L 2.51  FVC-Predicted Pre % 93  FVC-Post L 2.66  FVC-Predicted Post % 98  Pre FEV1/FVC % % 75  Post FEV1/FCV % % 77  FEV1-Pre L 1.89  FEV1-Predicted Pre % 92  FEV1-Post L 2.04  DLCO UNC% % 103  DLCO COR %Predicted % 223  TLC L 4.87  TLC % Predicted % 104  RV % Predicted % 112   I have personally reviewed the patient's PFTs and spirometry is normal, lung volumes are normal, there is no diffusion limitation.  Labs: I have sent 3 AFB sputum cultures, 1 out of 3 have grown Mycobacterium avium complex which is susceptible to amikacin and clarithromycin, resistant to linezolid and moxifloxacin.  Immunization status: Immunization History  Administered Date(s) Administered  . Influenza Inj Mdck Quad Pf 10/08/2016  . Influenza Split 09/13/2012  . Influenza, High Dose Seasonal PF 10/18/2018  . Influenza,inj,Quad PF,6+ Mos 09/14/2019  . Influenza-Unspecified 11/24/2013, 09/03/2014, 10/16/2015  . Moderna SARS-COVID-2 Vaccination 02/01/2020, 03/01/2020  . Pneumococcal Conjugate-13 08/10/2018  . Pneumococcal Polysaccharide-23 10/16/2015    Assessment:  Pectus excavatum Mycobacterium avium complex Chest pain  Plan/Recommendations: Ms. Tatro has pectus excavatum deformity which is probably what is predisposing her to having lack of activity and complex infection.  She is not substantially symptomatic with systemic signs or symptoms of infection.  At this time given that she is only grown 1 culture we will plan for airway clearance regimen with nebulized saline 2-3 times a day followed by flutter valve.  She will continue to drop off AFB sputum cultures today from hospital so I am able to document either clearance or reinfection.  Should she have 2 cultures  positive we may consider therapy with a 4 drug regimen.  However her bigger concern is this chest pain.  She has had 2 normal echocardiograms.  I am unclear if her pectus excavatum deformity could be contributing to this chest pain.  It does not appear to be anginal in nature.  I wonder if she needs either a an exercise echo, exercise right heart cath, or CPAP.  I would like to have her see thoracic surgeon who sees more cases with pectus excavatum, as well as cardiology referral to a coronary care center to see if there is any inducible chest pain with exercise test.  I recommend referral [facility].  In the meantime I will continue to monitor her sputum cultures for clearance of her MAC.  I did discuss with her that due to her age she probably not a great candidate for any kind of corrective surgery for pectus, but it would be helpful for her to  know if her symptoms are related to this.   Return to Care: Return in about 3 months (around 07/17/2020).  She will follow up with an APP in 3 months to follow-up on the referral from [place] as well as her sputum cultures.  I will see her again after that.   Lenice Llamas, MD Pulmonary and [place]

## 2020-04-18 DIAGNOSIS — A310 Pulmonary mycobacterial infection: Secondary | ICD-10-CM | POA: Diagnosis not present

## 2020-04-25 ENCOUNTER — Other Ambulatory Visit: Payer: Self-pay

## 2020-04-25 MED ORDER — FLUTTER DEVI: 1.0000 | 0 refills | Status: AC | PRN

## 2020-04-30 NOTE — Telephone Encounter (Signed)
I have reviewed these results with the patient in her office visit 4/28. 1/3 cultures positive does not require treatment at this time. She is due to drop off more cultures. If she has a second culture positive we would treat. She will follow up with [place] in July after [facility] referral.

## 2020-04-30 NOTE — Telephone Encounter (Signed)
Noted. Nothing further needed at this time.  

## 2020-05-01 DIAGNOSIS — Q676 Pectus excavatum: Secondary | ICD-10-CM | POA: Diagnosis not present

## 2020-05-04 LAB — ACID FAST CULTURE WITH REFLEXED SENSITIVITIES (MYCOBACTERIA)
Acid Fast Culture: NEGATIVE
Acid Fast Culture: NEGATIVE

## 2020-05-13 ENCOUNTER — Other Ambulatory Visit (INDEPENDENT_AMBULATORY_CARE_PROVIDER_SITE_OTHER): Payer: Medicare HMO

## 2020-05-13 ENCOUNTER — Other Ambulatory Visit: Payer: Self-pay

## 2020-05-13 DIAGNOSIS — E890 Postprocedural hypothyroidism: Secondary | ICD-10-CM | POA: Diagnosis not present

## 2020-05-13 LAB — T4, FREE: Free T4: 1.31 ng/dL (ref 0.60–1.60)

## 2020-05-13 LAB — TSH: TSH: 0.05 u[IU]/mL — ABNORMAL LOW (ref 0.35–4.50)

## 2020-05-19 ENCOUNTER — Encounter: Payer: Self-pay | Admitting: Emergency Medicine

## 2020-05-19 ENCOUNTER — Ambulatory Visit
Admission: EM | Admit: 2020-05-19 | Discharge: 2020-05-19 | Disposition: A | Discharge status: Home / Self Care | Payer: Medicare HMO | Attending: Emergency Medicine | Admitting: Emergency Medicine

## 2020-05-19 ENCOUNTER — Other Ambulatory Visit: Payer: Self-pay

## 2020-05-19 DIAGNOSIS — S30861A Insect bite (nonvenomous) of abdominal wall, initial encounter: Secondary | ICD-10-CM

## 2020-05-19 DIAGNOSIS — W57XXXA Bitten or stung by nonvenomous insect and other nonvenomous arthropods, initial encounter: Secondary | ICD-10-CM

## 2020-05-19 MED ORDER — DOXYCYCLINE HYCLATE 100 MG PO CAPS: 100.0000 mg | ORAL_CAPSULE | Freq: Two times a day (BID) | ORAL | 0 refills | Status: AC

## 2020-05-19 NOTE — ED Triage Notes (Signed)
Tick bite in RT groin area x 2weeks ago.  Cold chills and decreased appetite since then.

## 2020-05-19 NOTE — ED Provider Notes (Signed)
[place]   [id number] 05/19/20 Arrival Time: 49  CC: Tick bite  SUBJECTIVE:  Tami Estrada is a 69 y.o. female complains of attached tick 2 weeks ago, removed at home.  Complains of chills, generalized body aches, and nausea x 1 day.  Localizes the bite to her lower abdomen.  Reports similar symptoms in the past with [facility].  Denies vomiting, headache, dizziness, rash, or abdominal pain.   ROS: As per HPI.  All other pertinent ROS negative.     Past Medical History:  Diagnosis Date  . Abnormal findings on diagnostic imaging of other specified body structures   . Bitten or stung by nonvenomous insect and other nonvenomous arthropods, initial encounter   . Chest pain, unspecified   . Disappearance and death of family member   . GERD (gastroesophageal reflux disease)    Esophogeal dysmotility- 2010  . History of [facility] spotted fever   . Hypothyroidism   . Other fatigue   . Pain in right shoulder   . Thyroid disease   . Toxic effect of contact with other venomous plant, accidental (unintentional), initial encounter   . Unspecified osteoarthritis, unspecified site    Past Surgical History:  Procedure Laterality Date  . BACK SURGERY     lumbar disc  . NECK SURGERY     cervical disc  . SHOULDER ARTHROSCOPY WITH DISTAL CLAVICLE RESECTION Right 02/22/2015   Procedure: RIGHT SHOULDER ARTHROSCOPY WITH DISTAL CLAVICLE RESECTION, DEBRIDEMENT;  Surgeon: Carole Civil, MD;  Location: [facility];  Service: Orthopedics;  Laterality: Right;  . SHOULDER OPEN ROTATOR CUFF REPAIR Right 02/22/2015   Procedure: ROTATOR CUFF REPAIR SHOULDER OPEN;  Surgeon: Carole Civil, MD;  Location: [facility];  Service: Orthopedics;  Laterality: Right;  . THYROID SURGERY     thyroidectomy   Allergies  Allergen Reactions  . Hydrocodone     Nausea/vomit   No current facility-administered medications on file prior to encounter.   Current Outpatient Medications on File Prior to Encounter    Medication Sig Dispense Refill  . ibuprofen (ADVIL) 800 MG tablet Take 800 mg by mouth 3 (three) times daily.    . predniSONE (STERAPRED UNI-PAK 21 TAB) 10 MG (21) TBPK tablet Take by mouth daily. Take 6 tabs by mouth daily  for 2 days, then 5 tabs for 2 days, then 4 tabs for 2 days, then 3 tabs for 2 days, 2 tabs for 2 days, then 1 tab by mouth daily for 2 days 42 tablet 0  . Respiratory Therapy Supplies (FLUTTER) DEVI 1 Device by Does not apply route as needed. 1 each 0  . sodium chloride 0.9 % nebulizer solution Take 3 mLs by nebulization in the morning and at bedtime. 90 mL 12  . SYNTHROID 75 MCG tablet Take 1 tablet (75 mcg total) by mouth daily before breakfast. 60 tablet 3   Social History   Socioeconomic History  . Marital status: Widowed    Spouse name: Not on file  . Number of children: Not on file  . Years of education: Not on file  . Highest education level: Not on file  Occupational History  . Not on file  Tobacco Use  . Smoking status: Never Smoker  . Smokeless tobacco: Never Used  Substance and Sexual Activity  . Alcohol use: No  . Drug use: No  . Sexual activity: Yes    Birth control/protection: Post-menopausal  Other Topics Concern  . Not on file  Social History Narrative  .  Not on file   Social Determinants of Health   Financial Resource Strain:   . Difficulty of Paying Living Expenses:   Food Insecurity:   . Worried About Charity fundraiser in the Last Year:   . Arboriculturist in the Last Year:   Transportation Needs:   . Film/video editor (Medical):   Marland Kitchen Lack of Transportation (Non-Medical):   Physical Activity:   . Days of Exercise per Week:   . Minutes of Exercise per Session:   Stress:   . Feeling of Stress :   Social Connections:   . Frequency of Communication with Friends and Family:   . Frequency of Social Gatherings with Friends and Family:   . Attends Religious Services:   . Active Member of Clubs or Organizations:   . Attends English as a second language teacher Meetings:   Marland Kitchen Marital Status:   Intimate Partner Violence:   . Fear of Current or Ex-Partner:   . Emotionally Abused:   Marland Kitchen Physically Abused:   . Sexually Abused:    Family History  Problem Relation Age of Onset  . Diabetes Sister   . Cancer Sister   . Pulmonary fibrosis Brother   . Lung cancer Brother   . COPD Daughter     OBJECTIVE: Vitals:   05/19/20 1428 05/19/20 1445  BP: (!) 159/87   Pulse: 96   Resp: 18   Temp: 99.8 F (37.7 C)   TempSrc: Oral   SpO2: 97%   Weight:  102 lb (46.3 kg)  Height:  5\' 2"  (1.575 m)    General appearance: alert; no distress Head: NCAT ENT: PERRL, EOMI grossly; oropharynx clear Lungs: clear to auscultation bilaterally Heart: regular rate and rhythm.   Extremities: no edema Skin: warm and dry; small area of skin peeling where tick was attached, NTTP, no obvious redness or swelling or bleeding Psychological: alert and cooperative; anxious mood and affect  ASSESSMENT & PLAN:  1. Tick bite of abdomen, initial encounter     Meds ordered this encounter  Medications  . doxycycline (VIBRAMYCIN) 100 MG capsule    Sig: Take 1 capsule (100 mg total) by mouth 2 (two) times daily for 14 days.    Dispense:  28 capsule    Refill:  0    Order Specific Question:   Supervising Provider    Answer:   Raylene Everts [id number]    Prescribed doxycycline 100 mg x 14 days To prevent tick bites, wear long sleeves, long pants, and light colors. Use insect repellent. Follow the instructions on the bottle. If the tick is biting, do not try to remove it with heat, alcohol, petroleum jelly, or fingernail polish. Use tweezers, curved forceps, or a tick-removal tool to grasp the tick. Gently pull up until the tick lets go. Do not twist or jerk the tick. Do not squeeze or crush the tick. Return here or go to ER if you have any new or worsening symptoms (rash, nausea, vomiting, fever, chills, headache, fatigue, etc...  Reviewed expectations  re: course of current medical issues. Questions answered. Outlined signs and symptoms indicating need for more acute intervention. Patient verbalized understanding. After Visit Summary given.   Lestine Box, PA-C 05/19/20 1453

## 2020-05-19 NOTE — Discharge Instructions (Signed)
Prescribed doxycycline 100 mg x 14 days To prevent tick bites, wear long sleeves, long pants, and light colors. Use insect repellent. Follow the instructions on the bottle. If the tick is biting, do not try to remove it with heat, alcohol, petroleum jelly, or fingernail polish. Use tweezers, curved forceps, or a tick-removal tool to grasp the tick. Gently pull up until the tick lets go. Do not twist or jerk the tick. Do not squeeze or crush the tick. Return here or go to ER if you have any new or worsening symptoms (rash, nausea, vomiting, fever, chills, headache, fatigue, etc..Marland Kitchen

## 2020-05-22 ENCOUNTER — Other Ambulatory Visit (HOSPITAL_COMMUNITY)
Admission: RE | Admit: 2020-05-22 | Discharge: 2020-05-22 | Disposition: A | Discharge status: Home / Self Care | Payer: Medicare HMO | Source: Ambulatory Visit | Attending: Internal Medicine | Admitting: Internal Medicine

## 2020-05-22 DIAGNOSIS — D3100 Benign neoplasm of unspecified conjunctiva: Secondary | ICD-10-CM | POA: Insufficient documentation

## 2020-05-24 LAB — ACID FAST SMEAR (AFB, MYCOBACTERIA)
Acid Fast Smear: NEGATIVE
Acid Fast Smear: NEGATIVE
Acid Fast Smear: NEGATIVE

## 2020-05-30 ENCOUNTER — Telehealth: Payer: Self-pay | Admitting: Internal Medicine

## 2020-05-30 NOTE — Telephone Encounter (Signed)
Please ask lab to do sensitivities on this M avium culture.

## 2020-05-30 NOTE — Telephone Encounter (Signed)
Called [facility] and spoke with Lovena Le. Lovena Le stated Patient's sputum, AFB is positive for M avium complex. Patient is seen by Dr Shearon Stalls, [place] 4/28/2. NOV 07/15/20 with Beth,NP.   Message routed to Dr Golden Circle for Dr Shearon Stalls today. Please advise, thank you.

## 2020-05-30 NOTE — Telephone Encounter (Signed)
The sensitivities test is showing as "in process".   Will close this encounter.

## 2020-06-04 LAB — AFB ORGANISM ID BY DNA PROBE
M avium complex: POSITIVE — AB
M tuberculosis complex: NEGATIVE

## 2020-06-04 LAB — ACID FAST CULTURE WITH REFLEXED SENSITIVITIES (MYCOBACTERIA): Acid Fast Culture: POSITIVE — AB

## 2020-06-05 LAB — AFB ORGANISM ID BY DNA PROBE
M avium complex: POSITIVE — AB
M tuberculosis complex: NEGATIVE

## 2020-06-05 LAB — ACID FAST CULTURE WITH REFLEXED SENSITIVITIES (MYCOBACTERIA): Acid Fast Culture: POSITIVE — AB

## 2020-06-06 LAB — AFB ORGANISM ID BY DNA PROBE
M avium complex: POSITIVE — AB
M tuberculosis complex: NEGATIVE

## 2020-06-06 LAB — ACID FAST CULTURE WITH REFLEXED SENSITIVITIES (MYCOBACTERIA): Acid Fast Culture: POSITIVE — AB

## 2020-06-07 DIAGNOSIS — M545 Low back pain: Secondary | ICD-10-CM | POA: Diagnosis not present

## 2020-06-07 DIAGNOSIS — Q676 Pectus excavatum: Secondary | ICD-10-CM | POA: Diagnosis not present

## 2020-06-07 DIAGNOSIS — L551 Sunburn of second degree: Secondary | ICD-10-CM | POA: Diagnosis not present

## 2020-06-07 DIAGNOSIS — E039 Hypothyroidism, unspecified: Secondary | ICD-10-CM | POA: Diagnosis not present

## 2020-06-07 DIAGNOSIS — Z0189 Encounter for other specified special examinations: Secondary | ICD-10-CM | POA: Diagnosis not present

## 2020-06-12 DIAGNOSIS — Z01818 Encounter for other preprocedural examination: Secondary | ICD-10-CM | POA: Diagnosis not present

## 2020-06-12 DIAGNOSIS — Q676 Pectus excavatum: Secondary | ICD-10-CM | POA: Diagnosis not present

## 2020-06-12 DIAGNOSIS — E039 Hypothyroidism, unspecified: Secondary | ICD-10-CM | POA: Diagnosis not present

## 2020-06-13 DIAGNOSIS — E039 Hypothyroidism, unspecified: Secondary | ICD-10-CM | POA: Diagnosis not present

## 2020-06-13 DIAGNOSIS — T84228A Displacement of internal fixation device of other bones, initial encounter: Secondary | ICD-10-CM | POA: Diagnosis not present

## 2020-06-13 DIAGNOSIS — R918 Other nonspecific abnormal finding of lung field: Secondary | ICD-10-CM | POA: Diagnosis not present

## 2020-06-13 DIAGNOSIS — R69 Illness, unspecified: Secondary | ICD-10-CM | POA: Diagnosis not present

## 2020-06-13 DIAGNOSIS — J939 Pneumothorax, unspecified: Secondary | ICD-10-CM | POA: Diagnosis not present

## 2020-06-13 DIAGNOSIS — T8484XA Pain due to internal orthopedic prosthetic devices, implants and grafts, initial encounter: Secondary | ICD-10-CM | POA: Diagnosis not present

## 2020-06-13 DIAGNOSIS — Z4682 Encounter for fitting and adjustment of non-vascular catheter: Secondary | ICD-10-CM | POA: Diagnosis not present

## 2020-06-13 DIAGNOSIS — J9811 Atelectasis: Secondary | ICD-10-CM | POA: Diagnosis not present

## 2020-06-13 DIAGNOSIS — A310 Pulmonary mycobacterial infection: Secondary | ICD-10-CM | POA: Diagnosis not present

## 2020-06-13 DIAGNOSIS — I080 Rheumatic disorders of both mitral and aortic valves: Secondary | ICD-10-CM | POA: Diagnosis not present

## 2020-06-13 DIAGNOSIS — J9383 Other pneumothorax: Secondary | ICD-10-CM | POA: Diagnosis not present

## 2020-06-13 DIAGNOSIS — Y838 Other surgical procedures as the cause of abnormal reaction of the patient, or of later complication, without mention of misadventure at the time of the procedure: Secondary | ICD-10-CM | POA: Diagnosis not present

## 2020-06-13 DIAGNOSIS — G8918 Other acute postprocedural pain: Secondary | ICD-10-CM | POA: Diagnosis not present

## 2020-06-13 DIAGNOSIS — J90 Pleural effusion, not elsewhere classified: Secondary | ICD-10-CM | POA: Diagnosis not present

## 2020-06-13 DIAGNOSIS — M1990 Unspecified osteoarthritis, unspecified site: Secondary | ICD-10-CM | POA: Diagnosis not present

## 2020-06-13 DIAGNOSIS — J95811 Postprocedural pneumothorax: Secondary | ICD-10-CM | POA: Diagnosis not present

## 2020-06-13 DIAGNOSIS — G8929 Other chronic pain: Secondary | ICD-10-CM | POA: Diagnosis not present

## 2020-06-13 DIAGNOSIS — E890 Postprocedural hypothyroidism: Secondary | ICD-10-CM | POA: Diagnosis not present

## 2020-06-13 DIAGNOSIS — Q676 Pectus excavatum: Secondary | ICD-10-CM | POA: Diagnosis not present

## 2020-07-08 DIAGNOSIS — J9811 Atelectasis: Secondary | ICD-10-CM | POA: Diagnosis not present

## 2020-07-08 DIAGNOSIS — Z79899 Other long term (current) drug therapy: Secondary | ICD-10-CM | POA: Diagnosis not present

## 2020-07-08 DIAGNOSIS — M79621 Pain in right upper arm: Secondary | ICD-10-CM | POA: Diagnosis not present

## 2020-07-08 DIAGNOSIS — Q676 Pectus excavatum: Secondary | ICD-10-CM | POA: Diagnosis not present

## 2020-07-08 DIAGNOSIS — J90 Pleural effusion, not elsewhere classified: Secondary | ICD-10-CM | POA: Diagnosis not present

## 2020-07-15 ENCOUNTER — Ambulatory Visit: Payer: Medicare HMO | Admitting: Primary Care

## 2020-07-25 ENCOUNTER — Encounter: Payer: Self-pay | Admitting: Pulmonary Disease

## 2020-07-25 ENCOUNTER — Other Ambulatory Visit: Payer: Self-pay

## 2020-07-25 ENCOUNTER — Ambulatory Visit: Payer: Medicare HMO | Admitting: Pulmonary Disease

## 2020-07-25 VITALS — BP 110/72 | HR 70 | Temp 98.1°F | Ht 62.0 in | Wt 104.6 lb

## 2020-07-25 DIAGNOSIS — J479 Bronchiectasis, uncomplicated: Secondary | ICD-10-CM | POA: Insufficient documentation

## 2020-07-25 DIAGNOSIS — A310 Pulmonary mycobacterial infection: Secondary | ICD-10-CM | POA: Insufficient documentation

## 2020-07-25 DIAGNOSIS — Q676 Pectus excavatum: Secondary | ICD-10-CM | POA: Diagnosis not present

## 2020-07-25 NOTE — Assessment & Plan Note (Signed)
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

## 2020-07-25 NOTE — Patient Instructions (Addendum)
You were seen today by Lauraine Rinne, NP  for:   1. 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 Bronchiectasis without complication (HCC)  Bronchiectasis: This is the medical term which indicates that you have damage, dilated airways making you more susceptible to respiratory infection. Use a flutter valve 10 breaths twice a day or 4 to 5 breaths 4-5 times a day to help clear mucus out Let us know if you have cough with change in mucus color or fevers or chills.  At that point you would need an antibiotic. Maintain a healthy nutritious diet, eating whole foods Take your medications as prescribed    Try to restart your hypertonic saline nebs and progress as tolerated  Ideally we can resume flutter valve use if cleared by cardiothoracic surgery   We recommend today:  No orders of the defined types were placed in this encounter.  No orders of the defined types were placed in this encounter.  No orders of the defined types were placed in this encounter.   Follow Up:    Return in about 14 weeks (around 10/31/2020), or if symptoms worsen or fail to improve, for Follow up with Dr. Shearon Stalls.   Please do your part to reduce the spread of COVID-19:      Reduce your risk of any infection  and COVID19 by using the similar precautions used for avoiding the common cold or flu:  Marland Kitchen Wash your hands often with soap and warm water for at least 20 seconds.  If soap and water are not readily available, use an alcohol-based hand sanitizer with at least 60% alcohol.  . If coughing or sneezing, cover your mouth and nose by coughing or sneezing into the elbow areas of your shirt or coat, into a tissue or into your sleeve (not your hands). Langley Gauss A MASK when in public  . Avoid shaking hands with others and consider head nods or verbal greetings only. . Avoid  touching your eyes, nose, or mouth with unwashed hands.  . Avoid close contact with people who are sick. . Avoid places or events with large numbers of people in one location, like concerts or sporting events. . If you have some symptoms but not all symptoms, continue to monitor at home and seek medical attention if your symptoms worsen. . If you are having a medical emergency, call 911.   [place] / e-Visit: [email or web address]         [facility]: [facility]: [phone]                   [facility]: [phone]     It is flu season:   >>> Best ways to protect herself from the flu: Receive the yearly flu vaccine, practice good hand hygiene washing with soap and also using hand sanitizer when available, eat a nutritious meals, get adequate rest, hydrate appropriately   Please contact the office if your symptoms worsen or you have concerns that you are not improving.   Thank you for choosing  [facility] for your healthcare, and for allowing [place] to partner with you on your healthcare journey. I am thankful to be able to provide care to you today.   Wyn Quaker FNP-C

## 2020-07-25 NOTE — Assessment & Plan Note (Signed)
Status post surgery at [facility] outpatient day surgery planned for 09/25/2020

## 2020-07-25 NOTE — Assessment & Plan Note (Signed)
Plan: Try to resume flutter valve use if cleared by cardiothoracic surgery at [facility] hypertonic saline nebs twice daily We will continue to clinically monitor Follow-up with our office in November/2021 with Dr. Shearon Stalls

## 2020-07-25 NOTE — Progress Notes (Signed)
_0  ID: Tami Estrada, female    DOB: 02-May-1951, 69 y.o.   MRN: [id number]  Chief Complaint  Patient presents with  . 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 Shearon Stalls  07/25/2020  - Visit   69 year old female never smoker followed in our office for Mycobacterium infection, pectus excavatum as well as chest pain.  At last office visit in April/2021 was recommended that she be referred to [facility].  We will also follow her on sputum cultures.  She was requested to follow-up in 3 months.  May need to consider treatment therapy for pulmonary MAC infection.  Since last being seen patient is status post surgical reconstruction and repair of pectus excavatum by Dr. Wynelle Cleveland at [facility].  Patient reports that she is doing okay since recovering from surgery.  It is a slow process.  She is planned to have a day outpatient surgery on 09/25/2020 with Dr. Wynelle Cleveland.  She has not been adherent to hypertonic saline nebs and flutter valve due to recovery from surgery.  No significant fevers or significant sputum production.  Questionaires / Pulmonary Flowsheets:   ACT:  No flowsheet data found.  MMRC: mMRC Dyspnea Scale mMRC Score  04/17/2020 1  03/15/2020 3    [place]:  No flowsheet data found.  Tests:   05/14/2020-AFB-positive-Mycobacterium avium complex-pending sensitivities  03/09/2020-CT chest without contrast-interval increase in multifocal nodular appearing bronchovascular opacities throughout both lungs, these are associated with tree-in-bud opacities, there is a predominance of findings within the upper lobes and right middle lobe, findings are compatible with atypical  Mycobacterium infection, recommend follow-up chest CT in 6 months, no pleural effusion, stable bronchiectasis, stable pectus excavatum compressing the right heart  FENO:  No results found for: NITRICOXIDE  PFT: PFT Results Latest Ref Rng & Units 04/17/2020  FVC-Pre L 2.51  FVC-Predicted Pre % 93  FVC-Post L 2.66  FVC-Predicted Post % 98  Pre FEV1/FVC % % 75  Post FEV1/FCV % % 77  FEV1-Pre L 1.89  FEV1-Predicted Pre % 92  FEV1-Post L 2.04  DLCO uncorrected ml/min/mmHg 18.44  DLCO UNC% % 103  DLCO corrected ml/min/mmHg 33.62  DLCO COR %Predicted % 188  DLVA Predicted % 223  TLC L 4.87  TLC % Predicted % 104  RV % Predicted % 112    WALK:  No flowsheet data found.  Imaging: No results found.  Lab Results:  CBC    Component Value Date/Time   WBC 5.2 08/23/2019 0000   WBC 15.1 (H) 02/22/2015 1504   RBC 4.42 08/23/2019 0000   HGB 13.4 08/23/2019 0000   HCT 39 08/23/2019 0000   PLT 219 08/23/2019 0000   MCV 94.4 02/22/2015 1504   MCH 31.0 02/22/2015 1504   MCHC 32.8 02/22/2015 1504   RDW 12.7 02/22/2015 1504    BMET    Component Value Date/Time   NA 143 10/06/2019 0723   NA 142 08/23/2019 0000   K 3.9 10/06/2019 0723   CL 105 10/06/2019 0723   CO2 28 10/06/2019 0723   GLUCOSE 80 10/06/2019 0723   BUN 19 10/06/2019 0723   BUN 25 (A) 08/23/2019 0000   CREATININE 0.88 10/06/2019 0723   CALCIUM 9.5  10/06/2019 0723   GFRNONAA 67 10/06/2019 0723   GFRAA 77 10/06/2019 0723    BNP No results found for: BNP  ProBNP No results found for: PROBNP  Specialty Problems      Pulmonary Problems   Common cold   Bronchiectasis without complication (HCC)      Allergies  Allergen Reactions  . Hydrocodone     Nausea/vomit    Immunization History  Administered Date(s) Administered  . Influenza Inj Mdck Quad Pf 10/08/2016  . Influenza Split 09/13/2012  . Influenza, High Dose Seasonal PF 10/18/2018  . Influenza,inj,Quad PF,6+ Mos 09/14/2019  .  Influenza-Unspecified 11/24/2013, 09/03/2014, 10/16/2015  . Moderna SARS-COVID-2 Vaccination 02/01/2020, 03/01/2020  . Pneumococcal Conjugate-13 08/10/2018  . Pneumococcal Polysaccharide-23 10/16/2015    Past Medical History:  Diagnosis Date  . Abnormal findings on diagnostic imaging of other specified body structures   . Bitten or stung by nonvenomous insect and other nonvenomous arthropods, initial encounter   . Chest pain, unspecified   . Disappearance and death of family member   . GERD (gastroesophageal reflux disease)    Esophogeal dysmotility- 2010  . History of [facility] spotted fever   . Hypothyroidism   . Other fatigue   . Pain in right shoulder   . Thyroid disease   . Toxic effect of contact with other venomous plant, accidental (unintentional), initial encounter   . Unspecified osteoarthritis, unspecified site     Tobacco History: Social History   Tobacco Use  Smoking Status Never Smoker  Smokeless Tobacco Never Used   Counseling given: Not Answered   Continue to not smoke  Outpatient Encounter Medications as of 07/25/2020  Medication Sig  . gabapentin (NEURONTIN) 100 MG capsule Take 100 mg by mouth 3 (three) times daily.  Marland Kitchen ibuprofen (ADVIL) 800 MG tablet Take 800 mg by mouth 3 (three) times daily.  Marland Kitchen Respiratory Therapy Supplies (FLUTTER) DEVI 1 Device by Does not apply route as needed.  . sodium chloride 0.9 % nebulizer solution Take 3 mLs by nebulization in the morning and at bedtime.  Marland Kitchen SYNTHROID 75 MCG tablet Take 1 tablet (75 mcg total) by mouth daily before breakfast.  . traMADol (ULTRAM) 50 MG tablet Take by mouth.  . [DISCONTINUED] predniSONE (STERAPRED UNI-PAK 21 TAB) 10 MG (21) TBPK tablet Take by mouth daily. Take 6 tabs by mouth daily  for 2 days, then 5 tabs for 2 days, then 4 tabs for 2 days, then 3 tabs for 2 days, 2 tabs for 2 days, then 1 tab by mouth daily for 2 days   No facility-administered encounter medications on file as of  07/25/2020.     Review of Systems  Review of Systems  Constitutional: Positive for fatigue. Negative for activity change and fever.  HENT: Negative for sinus pressure, sinus pain and sore throat.   Respiratory: Negative for cough, shortness of breath and wheezing.   Cardiovascular: Positive for chest pain. Negative for palpitations.  Gastrointestinal: Negative for diarrhea, nausea and vomiting.  Musculoskeletal: Negative for arthralgias.  Neurological: Negative for dizziness.  Psychiatric/Behavioral: Negative for sleep disturbance. The patient is not nervous/anxious.      Physical Exam  BP 110/72 (BP Location: Left Arm, Cuff Size: Normal)   Pulse 70   Temp 98.1 F (36.7 C) (Oral)   Ht _0  (1.575 m)   Wt 104 lb 9.6 oz (47.4 kg)   SpO2 96%   BMI 19.13 kg/m   Wt Readings from Last 5 Encounters:  07/25/20 104 lb  9.6 oz (47.4 kg)  05/19/20 102 lb (46.3 kg)  04/17/20 107 lb (48.5 kg)  04/01/20 101 lb 6.4 oz (46 kg)  03/15/20 103 lb (46.7 kg)    BMI Readings from Last 5 Encounters:  07/25/20 19.13 kg/m  05/19/20 18.66 kg/m  04/17/20 19.57 kg/m  04/01/20 18.55 kg/m  03/15/20 18.84 kg/m     Physical Exam Vitals and nursing note reviewed.  Constitutional:      General: She is not in acute distress.    Appearance: Normal appearance. She is normal weight.  HENT:     Head: Normocephalic and atraumatic.     Right Ear: External ear normal.     Left Ear: External ear normal.  Eyes:     Pupils: Pupils are equal, round, and reactive to light.  Cardiovascular:     Rate and Rhythm: Normal rate and regular rhythm.     Pulses: Normal pulses.     Heart sounds: Normal heart sounds. No murmur heard.   Pulmonary:     Effort: Pulmonary effort is normal. No respiratory distress.     Breath sounds: Normal breath sounds. No decreased air movement. No decreased breath sounds, wheezing or rales.  Musculoskeletal:     Cervical back: Normal range of motion.  Skin:    General:  Skin is warm and dry.     Capillary Refill: Capillary refill takes less than 2 seconds.  Neurological:     General: No focal deficit present.     Mental Status: She is alert and oriented to person, place, and time. Mental status is at baseline.     Gait: Gait normal.  Psychiatric:        Mood and Affect: Mood normal.        Behavior: Behavior normal.        Thought Content: Thought content normal.        Judgment: Judgment normal.       Assessment & Plan:   Bronchiectasis without complication (HCC) Plan: Try to resume flutter valve use if cleared by cardiothoracic surgery at [facility] hypertonic saline nebs twice daily We will continue to clinically monitor Follow-up with our office in November/2021 with Dr. 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 Shearon Stalls.   Lauraine Rinne, NP 07/25/2020   This appointment required 42 minutes of patient care (this includes precharting, chart review, review of results, face-to-face care, etc.).

## 2020-08-05 ENCOUNTER — Encounter: Payer: Self-pay | Admitting: Internal Medicine

## 2020-08-05 ENCOUNTER — Ambulatory Visit: Payer: Medicare HMO | Admitting: Internal Medicine

## 2020-08-05 ENCOUNTER — Other Ambulatory Visit: Payer: Self-pay

## 2020-08-05 VITALS — BP 130/80 | HR 72 | Ht 62.0 in | Wt 104.6 lb

## 2020-08-05 DIAGNOSIS — E890 Postprocedural hypothyroidism: Secondary | ICD-10-CM | POA: Diagnosis not present

## 2020-08-05 LAB — TSH: TSH: 0.06 u[IU]/mL — ABNORMAL LOW (ref 0.35–4.50)

## 2020-08-05 LAB — T4, FREE: Free T4: 1.48 ng/dL (ref 0.60–1.60)

## 2020-08-05 NOTE — Progress Notes (Signed)
Name: Tami Estrada  MRN/ DOB: [id number], Aug 17, 1951    Age/ Sex: 69 y.o., female     PCP: Celene Squibb, MD   Reason for Endocrinology Evaluation: Hypothyroidism     Initial Endocrinology Clinic Visit: 04/01/2020    PATIENT IDENTIFIER: [name] is a 69 y.o., female with a past medical history of Hypothyroidism and pectus excavatum . She has followed with [facility] since 04/01/2020 for consultative assistance with management of her Hypothyroidism.   HISTORICAL SUMMARY: She is S/P thyroidectomy many years secondary to goiter. Was started on LT-4 replacement  at the time.   SUBJECTIVE:    Today (08/05/2020):  Tami Estrada is here for a follow up on hypothyroidism.    Weight has been stable   S/P pectus excavatum sx on 6/24th . Has occasional left sided chest pain. Alternates between tylenol and ibuprofen as well as tramadol and gabapentin.   Denies headaches or blurry vision Denies sob Has chronic constipation , uses miralax  as needed  Levothyroxine 75 mcg daily - has been compliant with it.   No biotin    ROS:  As per HPI.   HISTORY:  Past Medical History:  Past Medical History:  Diagnosis Date  . Abnormal findings on diagnostic imaging of other specified body structures   . Bitten or stung by nonvenomous insect and other nonvenomous arthropods, initial encounter   . Chest pain, unspecified   . Disappearance and death of family member   . GERD (gastroesophageal reflux disease)    Esophogeal dysmotility- 2010  . History of [facility] spotted fever   . Hypothyroidism   . Other fatigue   . Pain in right shoulder   . Thyroid disease   . Toxic effect of contact with other venomous plant, accidental (unintentional), initial encounter   . Unspecified osteoarthritis, unspecified site    Past Surgical History:  Past Surgical History:  Procedure Laterality Date  . BACK SURGERY     lumbar disc  . NECK SURGERY     cervical disc  .  SHOULDER ARTHROSCOPY WITH DISTAL CLAVICLE RESECTION Right 02/22/2015   Procedure: RIGHT SHOULDER ARTHROSCOPY WITH DISTAL CLAVICLE RESECTION, DEBRIDEMENT;  Surgeon: Carole Civil, MD;  Location: [facility];  Service: Orthopedics;  Laterality: Right;  . SHOULDER OPEN ROTATOR CUFF REPAIR Right 02/22/2015   Procedure: ROTATOR CUFF REPAIR SHOULDER OPEN;  Surgeon: Carole Civil, MD;  Location: [facility];  Service: Orthopedics;  Laterality: Right;  . THYROID SURGERY     thyroidectomy    Social History:  reports that she has never smoked. She has never used smokeless tobacco. She reports that she does not drink alcohol and does not use drugs. Family History:  Family History  Problem Relation Age of Onset  . Diabetes Sister   . Cancer Sister   . Pulmonary fibrosis Brother   . Lung cancer Brother   . COPD Daughter      HOME MEDICATIONS: Allergies as of 08/05/2020      Reactions   Hydrocodone    Nausea/vomit      Medication List       Accurate as of August 05, 2020 11:59 PM. If you have any questions, ask your nurse or doctor.        STOP taking these medications   Synthroid 75 MCG tablet Generic drug: levothyroxine Replaced by: Synthroid 50 MCG tablet Stopped by: Dorita Sciara, MD     TAKE these medications   Flutter Kerrin Mo  1 Device by Does not apply route as needed.   gabapentin 100 MG capsule Commonly known as: NEURONTIN Take 100 mg by mouth 3 (three) times daily.   ibuprofen 800 MG tablet Commonly known as: ADVIL Take 800 mg by mouth 3 (three) times daily.   sodium chloride 0.9 % nebulizer solution Take 3 mLs by nebulization in the morning and at bedtime. What changed:   when to take this  reasons to take this   Synthroid 50 MCG tablet Generic drug: levothyroxine Take 1 tablet (50 mcg total) by mouth daily before breakfast. Replaces: Synthroid 75 MCG tablet Started by: Dorita Sciara, MD         OBJECTIVE:   PHYSICAL EXAM: VS: BP 130/80    Pulse 72   Ht 5\' 2"  (1.575 m)   Wt 104 lb 9.6 oz (47.4 kg)   SpO2 98%   BMI 19.13 kg/m    EXAM: General: Pt appears well and is in NAD  Hydration: Well-hydrated with moist mucous membranes and good skin turgor  Eyes: External eye exam normal without stare, lid lag or exophthalmos.  EOM intact.  PERRL.  Ears, Nose, Throat: Hearing: Grossly intact bilaterally Dental: Good dentition  Throat: Clear without mass, erythema or exudate  Neck: General: Supple without adenopathy. Thyroid: Thyroid size normal.  No goiter or nodules appreciated. No thyroid bruit.  Lungs: Clear with good BS bilat with no rales, rhonchi, or wheezes  Heart: Auscultation: RRR.  Abdomen: Normoactive bowel sounds, soft, nontender, without masses or organomegaly palpable  Extremities: Gait and station: Normal gait  Digits and nails: No clubbing, cyanosis, petechiae, or nodes Head and neck: Normal alignment and mobility BL UE: Normal ROM and strength. BL LE: No pretibial edema normal ROM and strength.  Skin: Hair: Texture and amount normal with gender appropriate distribution Skin Inspection: No rashes, acanthosis nigricans/skin tags. No lipohypertrophy Skin Palpation: Skin temperature, texture, and thickness normal to palpation  Neuro: Cranial nerves: II - XII grossly intact  Cerebellar: Normal coordination and movement; no tremor Motor: Normal strength throughout DTRs: 2+ and symmetric in UE without delay in relaxation phase  Mental Status: Judgment, insight: Intact Orientation: Oriented to time, place, and person Memory: Intact for recent and remote events Mood and affect: No depression, anxiety, or agitation     DATA REVIEWED:  Results for Tami, Estrada (MRN [id number]) as of 08/06/2020 13:17  Ref. Range 05/13/2020 09:47 08/05/2020 10:15  TSH Latest Ref Range: 0.35 - 4.50 uIU/mL 0.05 (L) 0.06 (L)  T4,Free(Direct) Latest Ref Range: 0.60 - 1.60 ng/dL 1.31 1.48    ASSESSMENT / PLAN / RECOMMENDATIONS:    1. Postoperative Hypothyroidism:   - Pt is clinically euthyroid  - No local neck symptoms -- Pt educated extensively on the correct way to take levothyroxine (first thing in the morning with water, 30 minutes before eating or taking other medications). - Pt encouraged to double dose the following day if she were to miss a dose given long half-life of levothyroxine.     Medications  Stop Synthroid 75 mcg daily  Start Synthroid 50 mcg daily   2. Elevated Blood pRessure:  - Repeat Bp was 130/80 mmhg. But I have advised her to avoid NSAIDs but continue with tramadol, tylenol and gabapentin      Signed electronically by: Mack Guise, MD  [facility]  [facility] [address] [place], [place] [address] Phone: [phone] FAX: [phone]      CC: [place] Crane  Edwinna Areola, MD [place] [address] Phone: [phone]  Fax: [phone]   Return to Endocrinology clinic as below: Future Appointments  Date Time Provider [place]  09/30/2020  1:45 PM [facility] None  02/10/2021  1:00 PM Floreine Kingdon, Melanie Crazier, MD [facility] None

## 2020-08-05 NOTE — Patient Instructions (Signed)

## 2020-08-06 MED ORDER — SYNTHROID 50 MCG PO TABS: 50.0000 ug | ORAL_TABLET | Freq: Every day | ORAL | 6 refills | Status: DC

## 2020-09-03 ENCOUNTER — Telehealth: Payer: Self-pay | Admitting: Internal Medicine

## 2020-09-03 DIAGNOSIS — A310 Pulmonary mycobacterial infection: Secondary | ICD-10-CM

## 2020-09-03 NOTE — Telephone Encounter (Signed)
Patient is s/p repair of congenital pectus excavatum after I referred her. She needs follow up with me for this as well as for her bronchiectasis. 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

## 2020-09-04 NOTE — Telephone Encounter (Signed)
Called and spoke with Chryl Heck, daughter, listed on DPR.  Advised on referral to ID and need to get her scheduled to see Dr. Shearon Stalls.  Daughter stated they would call the office back to schedule f/u with Dr. Shearon Stalls.

## 2020-09-10 NOTE — Telephone Encounter (Signed)
Patient has scheduled appointment with Dr. Shearon Stalls on 09/11/20 at 11:45 am.  Nothing further needed.

## 2020-09-11 ENCOUNTER — Other Ambulatory Visit: Payer: Self-pay

## 2020-09-11 ENCOUNTER — Ambulatory Visit: Payer: Medicare HMO | Admitting: Internal Medicine

## 2020-09-11 ENCOUNTER — Encounter: Payer: Self-pay | Admitting: Internal Medicine

## 2020-09-11 VITALS — BP 130/70 | HR 69 | Temp 98.4°F | Ht 62.0 in | Wt 102.8 lb

## 2020-09-11 DIAGNOSIS — R0600 Dyspnea, unspecified: Secondary | ICD-10-CM

## 2020-09-11 DIAGNOSIS — Z23 Encounter for immunization: Secondary | ICD-10-CM | POA: Diagnosis not present

## 2020-09-11 DIAGNOSIS — Q676 Pectus excavatum: Secondary | ICD-10-CM | POA: Diagnosis not present

## 2020-09-11 NOTE — Progress Notes (Signed)
Tami Estrada    [id number]    [birth year]  [facility], Tami Areola, MD Date of Appointment: 09/11/2020 Established Patient Visit  Chief complaint:   Chief Complaint  Patient presents with  . Follow-up    some sob with climbing a hill, has to be really exerting herself.     HPI: Tami Estrada is a 69 y.o. woman who initially presented to see me in spring 2020 with chest pain and dyspnea. Found to have pectus excavatum and I referred her for surgery to [place]. Also incidental findings of MAI infection.  Interval Updates: Underwent Nuss procedure in July with Dr. Wynelle Cleveland. Haller index was 7.5. Rod to be removed next month. Has stopped airway clerance due to pain. Otherwise very happy with decision to have surgery. Breathing and chest pain much improved. Is gaining back weight. No fevers chills nightsweats weight loss or worsening cough  I have reviewed the patient's family social and past medical history and updated as appropriate.   Past Medical History:  Diagnosis Date  . Abnormal findings on diagnostic imaging of other specified body structures   . Bitten or stung by nonvenomous insect and other nonvenomous arthropods, initial encounter   . Chest pain, unspecified   . Disappearance and death of family member   . GERD (gastroesophageal reflux disease)    Esophogeal dysmotility- 2010  . History of [facility] spotted fever   . Hypothyroidism   . Other fatigue   . Pain in right shoulder   . Thyroid disease   . Toxic effect of contact with other venomous plant, accidental (unintentional), initial encounter   . Unspecified osteoarthritis, unspecified site     Past Surgical History:  Procedure Laterality Date  . BACK SURGERY     lumbar disc  . NECK SURGERY     cervical disc  . SHOULDER ARTHROSCOPY WITH DISTAL CLAVICLE RESECTION Right 02/22/2015   Procedure: RIGHT SHOULDER ARTHROSCOPY WITH DISTAL CLAVICLE RESECTION, DEBRIDEMENT;  Surgeon: Carole Civil, MD;  Location: [facility];  Service: Orthopedics;  Laterality: Right;  . SHOULDER OPEN ROTATOR CUFF REPAIR Right 02/22/2015   Procedure: ROTATOR CUFF REPAIR SHOULDER OPEN;  Surgeon: Carole Civil, MD;  Location: [facility];  Service: Orthopedics;  Laterality: Right;  . THYROID SURGERY     thyroidectomy    Family History  Problem Relation Age of Onset  . Diabetes Sister   . Cancer Sister   . Pulmonary fibrosis Brother   . Lung cancer Brother   . COPD Daughter     Social History   Occupational History  . Not on file  Tobacco Use  . Smoking status: Never Smoker  . Smokeless tobacco: Never Used  Substance and Sexual Activity  . Alcohol use: No  . Drug use: No  . Sexual activity: Yes    Birth control/protection: Post-menopausal     Physical Exam: Blood pressure 130/70, pulse 69, temperature 98.4 F (36.9 C), temperature source Temporal, height 5\' 2"  (1.575 m), weight 102 lb 12.8 oz (46.6 kg), SpO2 99 %.  Gen:      No acute distress Lungs:    Surgical incision anteriorly is well healed. Pectus is improved with rod in place. CV:         Regular rate and rhythm; no murmurs, rubs, or gallops.  No pedal edema   Data Reviewed: Imaging: I have personally reviewed the ct chest march 2021 with MAI infection and pectus excavatum  PFTs:  PFT Results Latest Ref Rng & Units 04/17/2020  FVC-Pre L 2.51  FVC-Predicted Pre % 93  FVC-Post L 2.66  FVC-Predicted Post % 98  Pre FEV1/FVC % % 75  Post FEV1/FCV % % 77  FEV1-Pre L 1.89  FEV1-Predicted Pre % 92  FEV1-Post L 2.04  DLCO uncorrected ml/min/mmHg 18.44  DLCO UNC% % 103  DLCO corrected ml/min/mmHg 33.62  DLCO COR %Predicted % 188  DLVA Predicted % 223  TLC L 4.87  TLC % Predicted % 104  RV % Predicted % 112   I have personally reviewed the patient's PFTs and they demonstrate restriction to ventilation  Labs:  Immunization status: Immunization History  Administered Date(s) Administered  . Fluad Quad(high Dose  65+) 09/11/2020  . Influenza Inj Mdck Quad Pf 10/08/2016  . Influenza Split 09/13/2012  . Influenza, High Dose Seasonal PF 10/18/2018  . Influenza,inj,Quad PF,6+ Mos 09/14/2019  . Influenza-Unspecified 11/24/2013, 09/03/2014, 10/16/2015  . Moderna SARS-COVID-2 Vaccination 02/01/2020, 03/01/2020  . Pneumococcal Conjugate-13 08/10/2018  . 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 Hopefully she does not require long term abx therapy.   I spent 30 minutes on 09/11/2020 in care of this patient including face to face time and non-face to face time spent charting, review of outside records, and coordination of care. 94585 10-19 minutes or Straightforward MDM 92924 20-29 minutes or Low level MDM 46286 30-39 minutes or Moderate level MDM 38177  40-54 minutes or High level MDM   Return to Care: Return in about 4 months (around 01/11/2021).   Lenice Llamas, MD Pulmonary and [place]

## 2020-09-11 NOTE — Patient Instructions (Signed)
The patient should have follow up scheduled with myself in 4 months.   Restart your airway clearance (nebulizer treatments.)

## 2020-09-25 DIAGNOSIS — J479 Bronchiectasis, uncomplicated: Secondary | ICD-10-CM | POA: Diagnosis not present

## 2020-09-25 DIAGNOSIS — Q676 Pectus excavatum: Secondary | ICD-10-CM | POA: Diagnosis not present

## 2020-09-25 DIAGNOSIS — Z01818 Encounter for other preprocedural examination: Secondary | ICD-10-CM | POA: Diagnosis not present

## 2020-09-25 DIAGNOSIS — A310 Pulmonary mycobacterial infection: Secondary | ICD-10-CM | POA: Diagnosis not present

## 2020-09-25 DIAGNOSIS — M79621 Pain in right upper arm: Secondary | ICD-10-CM | POA: Diagnosis not present

## 2020-09-25 DIAGNOSIS — R918 Other nonspecific abnormal finding of lung field: Secondary | ICD-10-CM | POA: Diagnosis not present

## 2020-09-25 DIAGNOSIS — J984 Other disorders of lung: Secondary | ICD-10-CM | POA: Diagnosis not present

## 2020-09-26 DIAGNOSIS — R053 Chronic cough: Secondary | ICD-10-CM | POA: Diagnosis not present

## 2020-09-26 DIAGNOSIS — J479 Bronchiectasis, uncomplicated: Secondary | ICD-10-CM | POA: Diagnosis not present

## 2020-09-26 DIAGNOSIS — E039 Hypothyroidism, unspecified: Secondary | ICD-10-CM | POA: Diagnosis not present

## 2020-09-26 DIAGNOSIS — Z885 Allergy status to narcotic agent status: Secondary | ICD-10-CM | POA: Diagnosis not present

## 2020-09-26 DIAGNOSIS — Q676 Pectus excavatum: Secondary | ICD-10-CM | POA: Diagnosis not present

## 2020-09-26 DIAGNOSIS — R918 Other nonspecific abnormal finding of lung field: Secondary | ICD-10-CM | POA: Diagnosis not present

## 2020-09-26 DIAGNOSIS — Z8776 Personal history of (corrected) congenital malformations of integument, limbs and musculoskeletal system: Secondary | ICD-10-CM | POA: Diagnosis not present

## 2020-09-26 DIAGNOSIS — A310 Pulmonary mycobacterial infection: Secondary | ICD-10-CM | POA: Diagnosis not present

## 2020-09-26 DIAGNOSIS — Z472 Encounter for removal of internal fixation device: Secondary | ICD-10-CM | POA: Diagnosis not present

## 2020-09-26 DIAGNOSIS — E890 Postprocedural hypothyroidism: Secondary | ICD-10-CM | POA: Diagnosis not present

## 2020-09-30 ENCOUNTER — Other Ambulatory Visit (INDEPENDENT_AMBULATORY_CARE_PROVIDER_SITE_OTHER): Payer: Medicare HMO

## 2020-09-30 ENCOUNTER — Other Ambulatory Visit: Payer: Self-pay

## 2020-09-30 DIAGNOSIS — E890 Postprocedural hypothyroidism: Secondary | ICD-10-CM | POA: Diagnosis not present

## 2020-10-01 LAB — TSH: TSH: 3.87 u[IU]/mL (ref 0.35–4.50)

## 2020-10-01 LAB — T4, FREE: Free T4: 0.73 ng/dL (ref 0.60–1.60)

## 2020-10-23 DIAGNOSIS — Q676 Pectus excavatum: Secondary | ICD-10-CM | POA: Diagnosis not present

## 2020-10-28 ENCOUNTER — Other Ambulatory Visit: Payer: Self-pay

## 2020-10-28 ENCOUNTER — Other Ambulatory Visit (HOSPITAL_COMMUNITY): Payer: Self-pay | Admitting: Physician Assistant

## 2020-10-28 ENCOUNTER — Ambulatory Visit (HOSPITAL_COMMUNITY)
Admission: RE | Admit: 2020-10-28 | Discharge: 2020-10-28 | Disposition: A | Discharge status: Home / Self Care | Payer: Medicare HMO | Source: Ambulatory Visit | Attending: Physician Assistant | Admitting: Physician Assistant

## 2020-10-28 ENCOUNTER — Emergency Department (HOSPITAL_COMMUNITY)
Admission: EM | Admit: 2020-10-28 | Discharge: 2020-10-28 | Disposition: A | Discharge status: Home / Self Care | Payer: Medicare HMO

## 2020-10-28 DIAGNOSIS — R0781 Pleurodynia: Secondary | ICD-10-CM

## 2020-10-28 DIAGNOSIS — R918 Other nonspecific abnormal finding of lung field: Secondary | ICD-10-CM | POA: Diagnosis not present

## 2020-11-04 DIAGNOSIS — E119 Type 2 diabetes mellitus without complications: Secondary | ICD-10-CM | POA: Diagnosis not present

## 2020-11-08 DIAGNOSIS — Q676 Pectus excavatum: Secondary | ICD-10-CM | POA: Diagnosis not present

## 2020-11-08 DIAGNOSIS — M545 Low back pain, unspecified: Secondary | ICD-10-CM | POA: Diagnosis not present

## 2020-11-08 DIAGNOSIS — Z0001 Encounter for general adult medical examination with abnormal findings: Secondary | ICD-10-CM | POA: Diagnosis not present

## 2020-11-08 DIAGNOSIS — E039 Hypothyroidism, unspecified: Secondary | ICD-10-CM | POA: Diagnosis not present

## 2020-11-08 DIAGNOSIS — E782 Mixed hyperlipidemia: Secondary | ICD-10-CM | POA: Diagnosis not present

## 2020-12-16 LAB — MAC SUSCEPTIBILITY BROTH

## 2020-12-25 DIAGNOSIS — Q676 Pectus excavatum: Secondary | ICD-10-CM | POA: Diagnosis not present

## 2021-02-05 ENCOUNTER — Encounter: Payer: Self-pay | Admitting: Internal Medicine

## 2021-02-05 ENCOUNTER — Other Ambulatory Visit: Payer: Self-pay

## 2021-02-05 ENCOUNTER — Ambulatory Visit: Payer: Medicare HMO | Admitting: Internal Medicine

## 2021-02-05 VITALS — BP 118/70 | HR 58 | Temp 97.5°F | Ht 62.0 in | Wt 110.5 lb

## 2021-02-05 DIAGNOSIS — J479 Bronchiectasis, uncomplicated: Secondary | ICD-10-CM | POA: Diagnosis not present

## 2021-02-05 DIAGNOSIS — A310 Pulmonary mycobacterial infection: Secondary | ICD-10-CM

## 2021-02-05 DIAGNOSIS — Q676 Pectus excavatum: Secondary | ICD-10-CM | POA: Diagnosis not present

## 2021-02-05 NOTE — Patient Instructions (Signed)
The patient should have follow up scheduled with myself in 6 months.   I will call you with culture results from sputum if you need to be seen sooner.

## 2021-02-05 NOTE — Progress Notes (Signed)
Tami Estrada    [id number]    10/05/1951  [facility], Edwinna Areola, MD Date of Appointment: 02/05/2021 Established Patient Visit  Chief complaint:   Chief Complaint  Patient presents with  . Follow-up    Coughing up this thick sputum that is clear.     HPI: Tami Estrada is a 70 y.o. woman who initially presented to see me in spring 2020 with chest pain and dyspnea. Found to have pectus excavatum and I referred her for surgery to [place] which she had in July 2021 Also incidental findings of MAI infection.  Interval Updates: Doing well. Still has some post-surgical pain but breathing and pain overall much improved. Cough is much stronger. Bring up mucus. Intermittently doing airway clearance. Cough worst first thing in the morning.  I have reviewed the patient's family social and past medical history and updated as appropriate.   Past Medical History:  Diagnosis Date  . Abnormal findings on diagnostic imaging of other specified body structures   . Bitten or stung by nonvenomous insect and other nonvenomous arthropods, initial encounter   . Chest pain, unspecified   . Disappearance and death of family member   . GERD (gastroesophageal reflux disease)    Esophogeal dysmotility- 2010  . History of [facility] spotted fever   . Hypothyroidism   . Other fatigue   . Pain in right shoulder   . Thyroid disease   . Toxic effect of contact with other venomous plant, accidental (unintentional), initial encounter   . Unspecified osteoarthritis, unspecified site     Past Surgical History:  Procedure Laterality Date  . BACK SURGERY     lumbar disc  . NECK SURGERY     cervical disc  . SHOULDER ARTHROSCOPY WITH DISTAL CLAVICLE RESECTION Right 02/22/2015   Procedure: RIGHT SHOULDER ARTHROSCOPY WITH DISTAL CLAVICLE RESECTION, DEBRIDEMENT;  Surgeon: Carole Civil, MD;  Location: [facility];  Service: Orthopedics;  Laterality: Right;  . SHOULDER OPEN ROTATOR CUFF  REPAIR Right 02/22/2015   Procedure: ROTATOR CUFF REPAIR SHOULDER OPEN;  Surgeon: Carole Civil, MD;  Location: [facility];  Service: Orthopedics;  Laterality: Right;  . THYROID SURGERY     thyroidectomy    Family History  Problem Relation Age of Onset  . Diabetes Sister   . Cancer Sister   . Pulmonary fibrosis Brother   . Lung cancer Brother   . COPD Daughter     Social History   Occupational History  . Not on file  Tobacco Use  . Smoking status: Never Smoker  . Smokeless tobacco: Never Used  Substance and Sexual Activity  . Alcohol use: No  . Drug use: No  . Sexual activity: Yes    Birth control/protection: Post-menopausal     Physical Exam: Blood pressure 118/70, pulse (!) 58, temperature (!) 97.5 F (36.4 C), temperature source Tympanic, height 5\' 2"  (1.575 m), weight 110 lb 8 oz (50.1 kg), SpO2 98 %.  Gen:      No acute distress Lungs:    Surgical incision anteriorly is well healed. Improved appearnce of pectus excavatum. CV:         Regular rate and rhythm; no murmurs, rubs, or gallops.  No pedal edema   Data Reviewed: Imaging: I have personally reviewed the ct chest march 2021 with MAI infection and pectus excavatum  PFTs:  PFT Results Latest Ref Rng & Units 04/17/2020  FVC-Pre L 2.51  FVC-Predicted Pre %  93  FVC-Post L 2.66  FVC-Predicted Post % 98  Pre FEV1/FVC % % 75  Post FEV1/FCV % % 77  FEV1-Pre L 1.89  FEV1-Predicted Pre % 92  FEV1-Post L 2.04  DLCO uncorrected ml/min/mmHg 18.44  DLCO UNC% % 103  DLCO corrected ml/min/mmHg 33.62  DLCO COR %Predicted % 188  DLVA Predicted % 223  TLC L 4.87  TLC % Predicted % 104  RV % Predicted % 112   I have personally reviewed the patient's PFTs and they demonstrate restriction to ventilation  Labs:  Immunization status: Immunization History  Administered Date(s) Administered  . Fluad Quad(high Dose 65+) 09/11/2020  . Influenza Inj Mdck Quad Pf 10/08/2016  . Influenza Split 09/13/2012  .  Influenza, High Dose Seasonal PF 10/18/2018  . Influenza,inj,Quad PF,6+ Mos 09/14/2019  . Influenza-Unspecified 11/24/2013, 09/03/2014, 10/16/2015  . Moderna Sars-Covid-2 Vaccination 02/01/2020, 03/01/2020  . Pneumococcal Conjugate-13 08/10/2018  . 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 Recommend nsaids, heating pads for post surgical pain as tolerated.   Return to Care: Return in about 6 months (around 08/05/2021).   Lenice Llamas, MD Pulmonary and [place]

## 2021-02-10 ENCOUNTER — Encounter: Payer: Self-pay | Admitting: Internal Medicine

## 2021-02-10 ENCOUNTER — Other Ambulatory Visit: Payer: Self-pay

## 2021-02-10 ENCOUNTER — Other Ambulatory Visit (HOSPITAL_COMMUNITY)
Admission: RE | Admit: 2021-02-10 | Discharge: 2021-02-10 | Disposition: A | Discharge status: Home / Self Care | Payer: Medicare HMO | Source: Ambulatory Visit | Attending: Internal Medicine | Admitting: Internal Medicine

## 2021-02-10 ENCOUNTER — Ambulatory Visit: Payer: Medicare HMO | Admitting: Internal Medicine

## 2021-02-10 ENCOUNTER — Telehealth: Payer: Self-pay | Admitting: Internal Medicine

## 2021-02-10 VITALS — BP 122/70 | HR 72 | Ht 62.0 in | Wt 110.4 lb

## 2021-02-10 DIAGNOSIS — A310 Pulmonary mycobacterial infection: Secondary | ICD-10-CM | POA: Diagnosis not present

## 2021-02-10 DIAGNOSIS — E890 Postprocedural hypothyroidism: Secondary | ICD-10-CM

## 2021-02-10 LAB — TSH: TSH: 4.86 u[IU]/mL — ABNORMAL HIGH (ref 0.35–4.50)

## 2021-02-10 NOTE — Progress Notes (Signed)
Name: Tami Estrada  MRN/ DOB: [id number], 12-06-1951    Age/ Sex: 70 y.o., female     PCP: Celene Squibb, MD   Reason for Endocrinology Evaluation: Hypothyroidism     Initial Endocrinology Clinic Visit: 04/01/2020    PATIENT IDENTIFIER: [name] is a 70 y.o., female with a past medical history of Hypothyroidism and pectus excavatum . She has followed with [facility] [facility] since 04/01/2020 for consultative assistance with management of her Hypothyroidism.   HISTORICAL SUMMARY: She is S/P thyroidectomy many years secondary to goiter. Was started on LT-4 replacement  at the time.     S/P pectus excavatum sx on 6/24th    SUBJECTIVE:    Today (08/05/2020):  Tami Estrada is here for a follow up on hypothyroidism. She is accompanied with daughter    Weight has been stable  Constipation has resolved  Denies depression or anxiety   No biotin use    HISTORY:  Past Medical History:  Past Medical History:  Diagnosis Date  . Abnormal findings on diagnostic imaging of other specified body structures   . Bitten or stung by nonvenomous insect and other nonvenomous arthropods, initial encounter   . Chest pain, unspecified   . Disappearance and death of family member   . GERD (gastroesophageal reflux disease)    Esophogeal dysmotility- 2010  . History of [facility] spotted fever   . Hypothyroidism   . Other fatigue   . Pain in right shoulder   . Thyroid disease   . Toxic effect of contact with other venomous plant, accidental (unintentional), initial encounter   . Unspecified osteoarthritis, unspecified site    Past Surgical History:  Past Surgical History:  Procedure Laterality Date  . BACK SURGERY     lumbar disc  . NECK SURGERY     cervical disc  . SHOULDER ARTHROSCOPY WITH DISTAL CLAVICLE RESECTION Right 02/22/2015   Procedure: RIGHT SHOULDER ARTHROSCOPY WITH DISTAL CLAVICLE RESECTION, DEBRIDEMENT;  Surgeon: Carole Civil, MD;  Location: [facility];   Service: Orthopedics;  Laterality: Right;  . SHOULDER OPEN ROTATOR CUFF REPAIR Right 02/22/2015   Procedure: ROTATOR CUFF REPAIR SHOULDER OPEN;  Surgeon: Carole Civil, MD;  Location: [facility];  Service: Orthopedics;  Laterality: Right;  . THYROID SURGERY     thyroidectomy    Social History:  reports that she has never smoked. She has never used smokeless tobacco. She reports that she does not drink alcohol and does not use drugs. Family History:  Family History  Problem Relation Age of Onset  . Diabetes Sister   . Cancer Sister   . Pulmonary fibrosis Brother   . Lung cancer Brother   . COPD Daughter      HOME MEDICATIONS: Allergies as of 02/10/2021      Reactions   Hydrocodone    Nausea/vomit      Medication List       Accurate as of February 10, 2021  1:15 PM. If you have any questions, ask your nurse or doctor.        Flutter Devi 1 Device by Does not apply route as needed.   ibuprofen 800 MG tablet Commonly known as: ADVIL Take 800 mg by mouth 3 (three) times daily.   sodium chloride 0.9 % nebulizer solution Take 3 mLs by nebulization in the morning and at bedtime. What changed:   when to take this  reasons to take this   Synthroid 50 MCG tablet Generic drug: levothyroxine  Take 1 tablet (50 mcg total) by mouth daily before breakfast.         OBJECTIVE:   PHYSICAL EXAM: VS:BP 122/70   Pulse 72   Ht 5\' 2"  (1.575 m)   Wt 110 lb 6 oz (50.1 kg)   SpO2 98%   BMI 20.19 kg/m    EXAM: General: Pt appears well and is in NAD  Neck: General: Supple without adenopathy. Thyroid: Thyroid size normal.  No goiter or nodules appreciated.   Lungs: Clear with good BS bilat with no rales, rhonchi, or wheezes  Heart: Auscultation: RRR.  Abdomen: Normoactive bowel sounds, soft, nontender, without masses or organomegaly palpable  Extremities:  BL LE: No pretibial edema normal ROM and strength.  Mental Status: Judgment, insight: Intact Orientation: Oriented  to time, place, and person Memory: Intact for recent and remote events Mood and affect: No depression, anxiety, or agitation     DATA REVIEWED:  Results for BETTYJEAN, STEFANSKI (MRN [id number]) as of 02/11/2021 12:57  Ref. Range 02/10/2021 13:40  TSH Latest Ref Range: 0.35 - 4.50 uIU/mL 4.86 (H)     ASSESSMENT / PLAN / RECOMMENDATIONS:   1. Postoperative Hypothyroidism:   - Pt is clinically euthyroid  - No local neck symptoms -- Pt educated extensively on the correct way to take levothyroxine (first thing in the morning with water, 30 minutes before eating or taking other medications). - Pt encouraged to double dose the following day if she were to miss a dose given long half-life of levothyroxine.     Medications   Change Synthroid 50 mcg  To two tablets on Sundays and 1 tablet the rest of the week    Labs in 8 weeks   F/U in 1 yr     Signed electronically by: Mack Guise, MD  [facility]  [facility] [address] [place],  [address] Phone: [phone] FAX: [phone]      CC: Celene Squibb, MD [place] [address] Phone: [phone]  Fax: [phone]   Return to Endocrinology clinic as below: No future appointments.

## 2021-02-10 NOTE — Telephone Encounter (Signed)
Encounter opened in error.  Nothing further needed at this time.  

## 2021-02-11 MED ORDER — SYNTHROID 50 MCG PO TABS: 50.0000 ug | ORAL_TABLET | ORAL | 3 refills | Status: DC

## 2021-02-24 ENCOUNTER — Telehealth: Payer: Self-pay | Admitting: Internal Medicine

## 2021-02-24 NOTE — Telephone Encounter (Signed)
Called patient's home. Spoke with daughter Tami Estrada about positive AFB. Tami Estrada is currently doing well without constitutional symptoms. Discussed continuing ongoing airway clearance. We would revisit the need to treat if she develops systemic signs/symptoms of disease.  Will revisit at follow up appt.

## 2021-03-05 LAB — AFB ORGANISM ID BY DNA PROBE
M avium complex: POSITIVE — AB
M tuberculosis complex: NEGATIVE

## 2021-03-05 LAB — MAC SUSCEPTIBILITY BROTH
Ciprofloxacin: >8
Clarithromycin: 2
Linezolid: >32
Moxifloxacin: 4
Rifampin: >4
Streptomycin: >32

## 2021-03-05 LAB — ACID FAST CULTURE WITH REFLEXED SENSITIVITIES (MYCOBACTERIA): Acid Fast Culture: POSITIVE — AB

## 2021-03-12 ENCOUNTER — Telehealth: Payer: Self-pay | Admitting: Internal Medicine

## 2021-03-12 DIAGNOSIS — A310 Pulmonary mycobacterial infection: Secondary | ICD-10-CM

## 2021-03-12 NOTE — Telephone Encounter (Signed)
CT Chest ordered to follow up on MAI infection

## 2021-03-12 NOTE — Telephone Encounter (Signed)
Call made to patient, spoke with patient daughter [name], confirmed patient's DOB. Made aware of message via ND. Voiced understanding.   Nothing further needed at this time.

## 2021-03-27 LAB — ACID FAST CULTURE WITH REFLEXED SENSITIVITIES (MYCOBACTERIA): Acid Fast Culture: NEGATIVE

## 2021-03-28 ENCOUNTER — Other Ambulatory Visit: Payer: Self-pay

## 2021-03-28 ENCOUNTER — Ambulatory Visit (HOSPITAL_COMMUNITY)
Admission: RE | Admit: 2021-03-28 | Discharge: 2021-03-28 | Disposition: A | Discharge status: Home / Self Care | Payer: Medicare HMO | Source: Ambulatory Visit | Attending: Internal Medicine | Admitting: Internal Medicine

## 2021-03-28 DIAGNOSIS — A310 Pulmonary mycobacterial infection: Secondary | ICD-10-CM | POA: Insufficient documentation

## 2021-03-28 DIAGNOSIS — J479 Bronchiectasis, uncomplicated: Secondary | ICD-10-CM | POA: Diagnosis not present

## 2021-03-28 DIAGNOSIS — Z8701 Personal history of pneumonia (recurrent): Secondary | ICD-10-CM | POA: Diagnosis not present

## 2021-03-28 DIAGNOSIS — J984 Other disorders of lung: Secondary | ICD-10-CM | POA: Diagnosis not present

## 2021-03-28 DIAGNOSIS — M954 Acquired deformity of chest and rib: Secondary | ICD-10-CM | POA: Diagnosis not present

## 2021-03-31 NOTE — Telephone Encounter (Signed)
Dr Shearon Stalls, please advise on pt email, thank you.

## 2021-04-08 ENCOUNTER — Encounter: Payer: Self-pay | Admitting: Internal Medicine

## 2021-04-08 ENCOUNTER — Ambulatory Visit (INDEPENDENT_AMBULATORY_CARE_PROVIDER_SITE_OTHER): Payer: Medicare HMO | Admitting: Internal Medicine

## 2021-04-08 DIAGNOSIS — A310 Pulmonary mycobacterial infection: Secondary | ICD-10-CM

## 2021-04-08 NOTE — Progress Notes (Signed)
Tami Estrada    [id number]    Tami Estrada  [facility], Tami Areola, MD Date of Appointment: 04/08/2021 Established Patient Visit  I connected with@ on 04/08/2021 by telephone enabled telemedicine application and verified that I am speaking with the correct person using two identifiers. Patient is at home, Physician is in office.    I discussed the limitations of evaluation and management by telemedicine. The patient expressed understanding and agreed to proceed.  Chief complaint:   CT results  HPI: Tami Estrada is a 70 y.o. woman who initially presented to see me in spring 2020 with chest pain and dyspnea. Found to have pectus excavatum and I referred her for surgery to [facility] which she had in July 2021 Also incidental findings of MAI infection.  Interval Updates: Having daily productive cough, chest tightness and pain and shortness of breath. This is progressing. We discussed CT results today which show progression of MAI infection. Sputum cultures reviewed with persistent MAI cultures positive.    I have reviewed the patient's family social and past medical history and updated as appropriate.   Past Medical History:  Diagnosis Date  . Abnormal findings on diagnostic imaging of other specified body structures   . Bitten or stung by nonvenomous insect and other nonvenomous arthropods, initial encounter   . Chest pain, unspecified   . Disappearance and death of family member   . GERD (gastroesophageal reflux disease)    Esophogeal dysmotility- 2010  . History of [facility] spotted fever   . Hypothyroidism   . Other fatigue   . Pain in right shoulder   . Thyroid disease   . Toxic effect of contact with other venomous plant, accidental (unintentional), initial encounter   . Unspecified osteoarthritis, unspecified site     Past Surgical History:  Procedure Laterality Date  . BACK SURGERY     lumbar disc  . NECK SURGERY     cervical disc  . SHOULDER  ARTHROSCOPY WITH DISTAL CLAVICLE RESECTION Right 02/22/2015   Procedure: RIGHT SHOULDER ARTHROSCOPY WITH DISTAL CLAVICLE RESECTION, DEBRIDEMENT;  Surgeon: Carole Civil, MD;  Location: [facility];  Service: Orthopedics;  Laterality: Right;  . SHOULDER OPEN ROTATOR CUFF REPAIR Right 02/22/2015   Procedure: ROTATOR CUFF REPAIR SHOULDER OPEN;  Surgeon: Carole Civil, MD;  Location: [facility];  Service: Orthopedics;  Laterality: Right;  . THYROID SURGERY     thyroidectomy    Family History  Problem Relation Age of Onset  . Diabetes Sister   . Cancer Sister   . Pulmonary fibrosis Brother   . Lung cancer Brother   . COPD Daughter     Social History   Occupational History  . Not on file  Tobacco Use  . Smoking status: Never Smoker  . Smokeless tobacco: Never Used  Substance and Sexual Activity  . Alcohol use: No  . Drug use: No  . Sexual activity: Yes    Birth control/protection: Post-menopausal     Physical Exam: Telelvisit.    Data Reviewed: Imaging: I have personally reviewed the ct chest march 2021 with MAI infection and pectus excavatum  PFTs:  PFT Results Latest Ref Rng & Units 04/17/2020  FVC-Pre L 2.51  FVC-Predicted Pre % 93  FVC-Post L 2.66  FVC-Predicted Post % 98  Pre FEV1/FVC % % 75  Post FEV1/FCV % % 77  FEV1-Pre L 1.89  FEV1-Predicted Pre % 92  FEV1-Post L 2.04  DLCO uncorrected ml/min/mmHg  18.44  DLCO UNC% % 103  DLCO corrected ml/min/mmHg 33.62  DLCO COR %Predicted % 188  DLVA Predicted % 223  TLC L 4.87  TLC % Predicted % 104  RV % Predicted % 112   I have personally reviewed the patient's PFTs and they demonstrate restriction to ventilation  Labs: Sputum cultures  Immunization status: Immunization History  Administered Date(s) Administered  . Fluad Quad(high Dose 65+) 09/11/2020  . Influenza Inj Mdck Quad Pf 10/08/2016  . Influenza Split 09/13/2012  . Influenza, High Dose Seasonal PF 10/18/2018  . Influenza,inj,Quad PF,6+ Mos  09/14/2019  . Influenza-Unspecified 11/24/2013, 09/03/2014, 10/16/2015  . Moderna Sars-Covid-2 Vaccination 02/01/2020, 03/01/2020  . Pneumococcal Conjugate-13 08/10/2018  . Pneumococcal Polysaccharide-23 10/16/2015    Assessment:  Pectus Excavatum s/p Nuss procedure July 2021 MAI infection with progression  Plan/Recommendations: Persistent positive MAI cultures with progression on CT Chest and ongoing symptoms of chest tightness and productive cough. I discussed my recommendations for treatment of MAI with Tami Estrada and her daughter Tami Estrada. She is currently unsure if she would be willing to commit to treatment. I have asked her to at least meet with ID for a detailed conversation about risks and benefits of treatment. She will continue airway clearance in the meantime.   I spent 21 minutes in the care of this patient today including pre-charting, chart review, review of results, face-to-face care, coordination of care and communication with consultants etc.).   Return to Care: No follow-ups on file. She will follow up in August with me. Sooner if problems.   Lenice Llamas, MD Pulmonary and [place]

## 2021-04-21 ENCOUNTER — Other Ambulatory Visit: Payer: Self-pay

## 2021-04-21 ENCOUNTER — Encounter: Payer: Self-pay | Admitting: Internal Medicine

## 2021-04-21 ENCOUNTER — Ambulatory Visit: Payer: Medicare HMO | Admitting: Internal Medicine

## 2021-04-21 VITALS — BP 153/74 | HR 59 | Temp 97.9°F | Wt 122.0 lb

## 2021-04-21 DIAGNOSIS — A310 Pulmonary mycobacterial infection: Secondary | ICD-10-CM

## 2021-04-21 DIAGNOSIS — J479 Bronchiectasis, uncomplicated: Secondary | ICD-10-CM | POA: Diagnosis not present

## 2021-04-21 NOTE — Assessment & Plan Note (Signed)
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  Discussed at length today with patient, her daughters, and son regarding the diagnosis and treatment for this infection.  Discussed multiple antibiotics for at least 12-18 months that may present their own risk of complications and side effects.  She wishes to proceed with initiating treatment which I think is reasonable given how symptomatic she is and the slow progression seen on CT chest.  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  Will plan for initial 3-drug regimen.  I am thinking ethambutol 25 mg/kg three times per week, azithromycin 500 mg three times per week, and rifabutin 300 mg three times per week (due to interaction of synthroid with rifampin).  Will discuss with ID pharmacist prior to initiating treatment for any further thoughts.  Baseline labs today and screening HIV.  EKG unable to be done but QTc previously normal.  Baseline eye exam for vision and color discrimination for ethambutol done (Left without glasses 20/200, Right without glasses 20/200, Both with glasses 20/25).  Will call patient after talking with pharmacy.  RTC 4 weeks.

## 2021-04-21 NOTE — Patient Instructions (Signed)
Thank you for coming to see me today. It was a pleasure seeing you.  To Do: Marland Kitchen Labs today . I will discuss with our ID Pharmacist later this afternoon regarding best treatment options for you to avoid drug interactions and find the most tolerable antibiotic choices . We will plan to get an EKG at your follow up visit since our machine is not currently available . Eye exam today  If you have any questions or concerns, please do not hesitate to call the office at [phone].  Take Care,   Jule Ser, DO

## 2021-04-21 NOTE — Progress Notes (Signed)
[place] for Infectious Disease  Reason for Consult: MAC infection  Referring Provider: Dr Shearon Stalls   HPI:    Tami Estrada is a 70 y.o. female with PMHx as below who presents to the clinic for further evaluation of MAC infection.   Patient follows with pulmonology initially seen in Spring 2020 with chest discomfort and dyspnea.  She was found to have pectus excavatum and she was referred to [facility] for surgery.  She had this surgery in June 2021.  Incidentally was found to have sputum cultures positive for MAC as well.  She was recently seen by Dr Shearon Stalls with pulmonary and noted to have daily productive cough, chest tightness, and SOB.  No fevers.  Her symptoms have been progressive.  She has multiple sputum cultures positive for MAC.  Recent follow up CT showed progression of her pulmonary disease.  She presents today to discuss treatment options for her MAC infection.    She is here today with her 2 daughters and son is on the phone.  She endorses daily productive cough with milky sputum.  She has daily dyspnea and chest discomfort.  She has no fevers or chills.  She is hesitant to start treatment due to the prolonged course of treatment and concern for side effects but is also motivated to treat her infection and ultimately wants to do so.   Vision testing: Left without glasses 20/200 Right without glasses 20/200 Both with glasses 20/25  Patient's Medications  New Prescriptions   No medications on file  Previous Medications   IBUPROFEN (ADVIL) 800 MG TABLET    Take 800 mg by mouth 3 (three) times daily.   RESPIRATORY THERAPY SUPPLIES (FLUTTER) DEVI    1 Device by Does not apply route as needed.   SODIUM CHLORIDE 0.9 % NEBULIZER SOLUTION    Take 3 mLs by nebulization in the morning and at bedtime.   SYNTHROID 50 MCG TABLET    Take 1 tablet (50 mcg total) by mouth as directed. Two tablets on sundays and 1 the rest of the week  Modified Medications   No medications on file   Discontinued Medications   No medications on file      Past Medical History:  Diagnosis Date  . Abnormal findings on diagnostic imaging of other specified body structures   . Bitten or stung by nonvenomous insect and other nonvenomous arthropods, initial encounter   . Chest pain, unspecified   . Disappearance and death of family member   . GERD (gastroesophageal reflux disease)    Esophogeal dysmotility- 2010  . History of [facility] spotted fever   . Hypothyroidism   . Other fatigue   . Pain in right shoulder   . Thyroid disease   . Toxic effect of contact with other venomous plant, accidental (unintentional), initial encounter   . Unspecified osteoarthritis, unspecified site     Social History   Tobacco Use  . Smoking status: Never Smoker  . Smokeless tobacco: Never Used  Substance Use Topics  . Alcohol use: No  . Drug use: No    Family History  Problem Relation Age of Onset  . Diabetes Sister   . Cancer Sister   . Pulmonary fibrosis Brother   . Lung cancer Brother   . COPD Daughter     Allergies  Allergen Reactions  . Hydrocodone     Nausea/vomit    Review of Systems  Constitutional: Negative for chills and fever.  HENT: Negative.  Eyes: Negative for blurred vision.  Respiratory: Positive for cough, sputum production and shortness of breath. Negative for hemoptysis.   Cardiovascular: Positive for chest pain.  Gastrointestinal: Negative.   Skin: Negative.       OBJECTIVE:    Vitals:   04/21/21 0904  BP: (!) 153/74  Pulse: (!) 59  Temp: 97.9 F (36.6 C)  TempSrc: Oral  SpO2: 97%  Weight: 122 lb (55.3 kg)     Body mass index is 22.31 kg/m.  Physical Exam Constitutional:      General: She is not in acute distress.    Appearance: Normal appearance.  Cardiovascular:     Rate and Rhythm: Normal rate and regular rhythm.     Pulses: Normal pulses.  Pulmonary:     Effort: No respiratory distress.     Breath sounds: Normal breath  sounds. No wheezing.  Skin:    General: Skin is warm and dry.     Findings: No rash.  Neurological:     General: No focal deficit present.     Mental Status: She is alert and oriented to person, place, and time.  Psychiatric:        Mood and Affect: Mood normal.        Behavior: Behavior normal.      Labs and Microbiology:  CBC Latest Ref Rng & Units 08/23/2019 02/22/2015 02/19/2015  WBC - 5.2 15.1(H) 6.4  Hemoglobin 12.0 - 16.0 13.4 11.7(L) 13.7  Hematocrit 36 - 46 39 35.7(L) 40.8  Platelets 150 - 399 219 193 233   CMP Latest Ref Rng & Units 10/06/2019 09/04/2019 08/23/2019  Glucose 65 - 99 mg/dL 80 - -  BUN 7 - 25 mg/dL 19 - 25(A)  Creatinine 0.60 - 0.93 mg/dL 0.88 0.80 0.8  Sodium 135 - 146 mmol/L 143 - 142  Potassium 3.5 - 5.3 mmol/L 3.9 - 4.5  Chloride 98 - 110 mmol/L 105 - 106  CO2 20 - 32 mmol/L 28 - 27(A)  Calcium 8.6 - 10.4 mg/dL 9.5 - 9.9  Total Protein 6.0 - 8.3 g/dL - - -  Total Bilirubin 0.3 - 1.2 mg/dL - - -  Alkaline Phos 25 - 125 - - 70  AST 13 - 35 - - 17  ALT 7 - 35 - - 11     No results found for this or any previous visit (from the past 240 hour(s)).  Imaging: IMPRESSION: Mild interval progression of disease with progressive peribronchial nodularity within the right upper lobe and development of new nodularity within the superior segment of the lower lobes bilaterally.   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  Discussed at length today with patient, her daughters, and son regarding the diagnosis and treatment for this infection.  Discussed multiple antibiotics for at least 12-18 months that may present their own risk of complications and side effects.  She wishes to proceed with initiating treatment which I think is reasonable given how symptomatic she is and the slow progression seen on CT chest.   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  Will plan for initial 3-drug regimen.  I am thinking ethambutol 25 mg/kg three times per week, azithromycin 500 mg three times per week, and rifabutin 300 mg three times per week (due to interaction of synthroid with rifampin).  Will discuss with ID pharmacist  prior to initiating treatment for any further thoughts.  Baseline labs today and screening HIV.  EKG unable to be done but QTc previously normal.  Baseline eye exam for vision and color discrimination for ethambutol done (Left without glasses 20/200, Right without glasses 20/200, Both with glasses 20/25).  Will call patient after talking with pharmacy.  RTC 4 weeks.     Orders Placed This Encounter  Procedures  . COMPLETE METABOLIC PANEL WITH GFR  . CBC  . HIV Antibody (routine testing w rflx)    I spent 45 minutes dedicated to the care of this patient on the date of this encounter to include pre-visit review of records, face-to-face time with the patient discussing pulmonary MAC, and post-visit ordering of testing.      Raynelle Highland for Infectious Disease [facility] 04/21/2021, 10:01 AM

## 2021-04-22 ENCOUNTER — Telehealth: Payer: Self-pay | Admitting: Internal Medicine

## 2021-04-22 LAB — COMPLETE METABOLIC PANEL WITH GFR
AG Ratio: 1.6 (calc) (ref 1.0–2.5)
ALT: 12 U/L (ref 6–29)
AST: 20 U/L (ref 10–35)
Albumin: 4.5 g/dL (ref 3.6–5.1)
Alkaline phosphatase (APISO): 82 U/L (ref 37–153)
BUN: 8 mg/dL (ref 7–25)
CO2: 29 mmol/L (ref 20–32)
Calcium: 9.7 mg/dL (ref 8.6–10.4)
Chloride: 105 mmol/L (ref 98–110)
Creat: 0.73 mg/dL (ref 0.60–0.93)
GFR, Est African American: 96 mL/min/{1.73_m2} (ref >=60)
GFR, Est Non African American: 83 mL/min/{1.73_m2} (ref >=60)
Globulin: 2.8 g/dL (calc) (ref 1.9–3.7)
Glucose, Bld: 88 mg/dL (ref 65–99)
Potassium: 4.6 mmol/L (ref 3.5–5.3)
Sodium: 141 mmol/L (ref 135–146)
Total Bilirubin: 0.6 mg/dL (ref 0.2–1.2)
Total Protein: 7.3 g/dL (ref 6.1–8.1)

## 2021-04-22 LAB — CBC
HCT: 41.8 % (ref 35.0–45.0)
Hemoglobin: 13.9 g/dL (ref 11.7–15.5)
MCH: 30.8 pg (ref 27.0–33.0)
MCHC: 33.3 g/dL (ref 32.0–36.0)
MCV: 92.5 fL (ref 80.0–100.0)
MPV: 9.8 fL (ref 7.5–12.5)
Platelets: 265 10*3/uL (ref 140–400)
RBC: 4.52 10*6/uL (ref 3.80–5.10)
RDW: 12.3 % (ref 11.0–15.0)
WBC: 4.7 10*3/uL (ref 3.8–10.8)

## 2021-04-22 LAB — HIV ANTIBODY (ROUTINE TESTING W REFLEX): HIV 1&2 Ab, 4th Generation: NONREACTIVE

## 2021-04-22 MED ORDER — AZITHROMYCIN 500 MG PO TABS: 500.0000 mg | ORAL_TABLET | ORAL | 5 refills | Status: DC

## 2021-04-22 MED ORDER — RIFABUTIN 150 MG PO CAPS: 300.0000 mg | ORAL_CAPSULE | ORAL | 5 refills | Status: DC

## 2021-04-22 MED ORDER — ETHAMBUTOL HCL 400 MG PO TABS: 25.0000 mg/kg | ORAL_TABLET | ORAL | 5 refills | Status: DC

## 2021-04-22 NOTE — Telephone Encounter (Signed)
Brief infectious disease telephone note:  As a follow-up to patient's office visit on 04/21/2021, I discussed her antibiotic regimen with our clinical pharmacist.  We agreed to initiate treatment with azithromycin 500 mg 3 times weekly, rifabutin 300 mg 3 times weekly, and ethambutol 25 mg/kg 3 times weekly.  I called patient and her daughter this afternoon to discuss starting therapy and they are in agreement with this plan.  Prescriptions will be sent to her pharmacy and she will notify us if there is any issues with cost.  I also advised patient to initiate treatment with 1 drug at a time to assess for tolerance at the beginning of therapy.  She will begin azithromycin followed by ethambutol and then rifabutin.  All questions answered.    Raynelle Highland for Infectious Disease [facility] 04/22/2021, 1:41 PM

## 2021-04-23 ENCOUNTER — Other Ambulatory Visit (HOSPITAL_COMMUNITY): Payer: Self-pay

## 2021-04-23 ENCOUNTER — Telehealth: Payer: Self-pay

## 2021-04-23 NOTE — Telephone Encounter (Addendum)
[facility] Patient Advocate Encounter  I was successful in securing patient a $2000 grant from [employer] to provide copayment coverage for Ethambutol & Rifabutin.  This will make the out of pocket expense $0.00.     [employer] ID: [id number]  I have spoken with the patient..     The billing information is as follows and has been shared with [facility].    RxBin: [id number] PCN: [id number] Member ID: [id number] Group ID: [id number] Dates of Eligibility: 04/23/21 through 03/23/22  Patient knows to call the office with questions or concerns.  Ileene Patrick, [facility] Patient [facility] for Infectious Disease Phone: [phone] Fax:  [phone]

## 2021-04-28 ENCOUNTER — Telehealth: Payer: Self-pay

## 2021-04-28 NOTE — Telephone Encounter (Signed)
Received call from patient's daughter wondering if patient could take her azithromycin, ethambutol, and rifabutin at night to allow the patient to sleep through the nausea side effects she's experienced. RN spoke with Janene Madeira, NP who states this should be fine as long as the patient stays consistent with timing of medication. RN relayed this to patient's daughter who verbalized understanding and has no further questions.   Beryle Flock, RN

## 2021-04-29 ENCOUNTER — Other Ambulatory Visit: Payer: Self-pay | Admitting: Internal Medicine

## 2021-05-09 DIAGNOSIS — E782 Mixed hyperlipidemia: Secondary | ICD-10-CM | POA: Diagnosis not present

## 2021-05-12 ENCOUNTER — Other Ambulatory Visit: Payer: Self-pay

## 2021-05-12 ENCOUNTER — Ambulatory Visit: Payer: Medicare HMO | Admitting: Internal Medicine

## 2021-05-12 ENCOUNTER — Encounter: Payer: Self-pay | Admitting: Internal Medicine

## 2021-05-12 VITALS — BP 127/71 | HR 72 | Temp 98.2°F | Wt 108.4 lb

## 2021-05-12 DIAGNOSIS — A310 Pulmonary mycobacterial infection: Secondary | ICD-10-CM

## 2021-05-12 DIAGNOSIS — B379 Candidiasis, unspecified: Secondary | ICD-10-CM

## 2021-05-12 MED ORDER — FLUCONAZOLE 150 MG PO TABS: 150.0000 mg | ORAL_TABLET | Freq: Once | ORAL | 0 refills | Status: AC

## 2021-05-12 NOTE — Assessment & Plan Note (Signed)
She is currently on azithromycin 500 mg 3 times weekly, rifabutin 300 mg 3 times weekly, and ethambutol 25 mg/kg 3 times weekly as of 04/22/2021.  She initiated medications 1 at a time every week and is about to start ethambutol today (started with azithromycin then rifabutin).  She did experience some nausea with azithromycin but now takes her medication in the evening prior to bed.  The rifabutin has made her feel some fatigue as well as diarrhea.  Particularly this weekend the fatigue was more noticeable after using a tanning bed.   Her daughter is hopeful to get her some Ensure shakes that have probiotics and protein to help with some of her GI symptoms and improve her nutrition for weight gain.  Baseline CBC and CMP 04/21/2021 were unremarkable.  Will continue 3 drug regimen as outlined above.  Hopefully she is able to start tolerating medications as she acclimates to the medications.  If not will have to consider modifying treatment to other agents.  Will have her back in 3 months for routine follow up labs and AFB sputum cultures.

## 2021-05-12 NOTE — Assessment & Plan Note (Signed)
She complains of yeast infection today so will rx fluconazole 150mg  x1 now and again in 72 hrs.

## 2021-05-12 NOTE — Progress Notes (Signed)
[facility] for Infectious Disease  CHIEF COMPLAINT:    Follow up for MAC  SUBJECTIVE:    Tami Estrada is a 70 y.o. female with PMHx as below who presents to the clinic for MAC.   See OV note from 04/21/21 for details of HPI.  Please see A&P for the details of today's visit and status of the patient's medical problems.   Patient's Medications  New Prescriptions   FLUCONAZOLE (DIFLUCAN) 150 MG TABLET    Take 1 tablet (150 mg total) by mouth once for 1 dose. Take a 2nd dose in 72 hrs  Previous Medications   AZITHROMYCIN (ZITHROMAX) 500 MG TABLET    Take 1 tablet (500 mg total) by mouth 3 (three) times a week.   ETHAMBUTOL (MYAMBUTOL) 400 MG TABLET    Take 3.5 tablets (1,400 mg total) by mouth 3 (three) times a week.   IBUPROFEN (ADVIL) 800 MG TABLET    Take 800 mg by mouth 3 (three) times daily.   RESPIRATORY THERAPY SUPPLIES (FLUTTER) DEVI    1 Device by Does not apply route as needed.   RIFABUTIN (MYCOBUTIN) 150 MG CAPSULE    Take 2 capsules (300 mg total) by mouth 3 (three) times a week.   SODIUM CHLORIDE 0.9 % NEBULIZER SOLUTION    INHALE THE CONTENTS OF 1 VIAL BY NEBULIZATION IN THE MORNING AND AT BEDTIME.   SYNTHROID 50 MCG TABLET    Take 1 tablet (50 mcg total) by mouth as directed. Two tablets on sundays and 1 the rest of the week  Modified Medications   No medications on file  Discontinued Medications   No medications on file      Past Medical History:  Diagnosis Date  . Abnormal findings on diagnostic imaging of other specified body structures   . Bitten or stung by nonvenomous insect and other nonvenomous arthropods, initial encounter   . Chest pain, unspecified   . Disappearance and death of family member   . GERD (gastroesophageal reflux disease)    Esophogeal dysmotility- 2010  . History of [facility] spotted fever   . Hypothyroidism   . Other fatigue   . Pain in right shoulder   . Thyroid disease   . Toxic effect of contact with other  venomous plant, accidental (unintentional), initial encounter   . Unspecified osteoarthritis, unspecified site     Social History   Tobacco Use  . Smoking status: Never Smoker  . Smokeless tobacco: Never Used  Substance Use Topics  . Alcohol use: No  . Drug use: No    Family History  Problem Relation Age of Onset  . Diabetes Sister   . Cancer Sister   . Pulmonary fibrosis Brother   . Lung cancer Brother   . COPD Daughter     Allergies  Allergen Reactions  . Hydrocodone     Nausea/vomit    Review of Systems  Constitutional: Positive for malaise/fatigue. Negative for fever.  Respiratory: Positive for cough.   Cardiovascular: Positive for chest pain.  Gastrointestinal: Positive for diarrhea and nausea. Negative for abdominal pain and vomiting.  Skin: Negative.   All other systems reviewed and are negative.    OBJECTIVE:    Vitals:   05/12/21 0847 05/12/21 0849  BP:  127/71  Pulse:  72  Temp:  98.2 F (36.8 C)  SpO2:  98%  Weight: 122 lb (55.3 kg) 108 lb 6.4 oz (49.2 kg)   Body mass index  is 19.83 kg/m.  Physical Exam Constitutional:      General: She is not in acute distress.    Appearance: Normal appearance.  Pulmonary:     Effort: Pulmonary effort is normal. No respiratory distress.  Neurological:     General: No focal deficit present.     Mental Status: She is alert and oriented to person, place, and time.  Psychiatric:        Mood and Affect: Mood normal.        Behavior: Behavior normal.      Labs and Microbiology: CBC Latest Ref Rng & Units 04/21/2021 08/23/2019 02/22/2015  WBC 3.8 - 10.8 Thousand/uL 4.7 5.2 15.1(H)  Hemoglobin 11.7 - 15.5 g/dL 13.9 13.4 11.7(L)  Hematocrit 35.0 - 45.0 % 41.8 39 35.7(L)  Platelets 140 - 400 Thousand/uL 265 219 193   CMP Latest Ref Rng & Units 04/21/2021 10/06/2019 09/04/2019  Glucose 65 - 99 mg/dL 88 80 -  BUN 7 - 25 mg/dL 8 19 -  Creatinine 0.60 - 0.93 mg/dL 0.73 0.88 0.80  Sodium 135 - 146 mmol/L 141 143 -   Potassium 3.5 - 5.3 mmol/L 4.6 3.9 -  Chloride 98 - 110 mmol/L 105 105 -  CO2 20 - 32 mmol/L 29 28 -  Calcium 8.6 - 10.4 mg/dL 9.7 9.5 -  Total Protein 6.1 - 8.1 g/dL 7.3 - -  Total Bilirubin 0.2 - 1.2 mg/dL 0.6 - -  Alkaline Phos 25 - 125 - - -  AST 10 - 35 U/L 20 - -  ALT 6 - 29 U/L 12 - -       ASSESSMENT & PLAN:    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  She initiated medications 1 at a time every week and is about to start ethambutol today (started with azithromycin then rifabutin).  She did experience some nausea with azithromycin but now takes her medication in the evening prior to bed.  The rifabutin has made her feel some fatigue as well as diarrhea.  Particularly this weekend the fatigue was more noticeable after using a tanning bed.   Her daughter is hopeful to get her some Ensure shakes that have probiotics and protein to help with some of her GI symptoms and improve her nutrition for weight gain.  Baseline CBC and CMP 04/21/2021 were unremarkable.  Will continue 3 drug regimen as outlined above.  Hopefully she is able to start tolerating medications as she acclimates to the medications.  If not will have to consider modifying treatment to other agents.  Will have her back in 3 months for routine follow up labs and AFB sputum cultures.    Yeast infection She complains of yeast infection today so will rx fluconazole 168m x1 now and again in 72 hrs.    No orders of the defined types were placed in this encounter.     ARaynelle Highlandfor Infectious Disease [facility] 05/12/2021, 9:14 AM

## 2021-05-12 NOTE — Patient Instructions (Signed)
Thank you for coming to see me today. It was a pleasure seeing you.  To Do: Marland Kitchen Continue with 3 antibiotics for MAC -- azithromycin, rifabutin, and ethambutol . I sent in fluconazole for yeast infection  If you have any questions or concerns, please do not hesitate to call the office at [phone].  Take Care,   Jule Ser, DO

## 2021-05-16 ENCOUNTER — Telehealth: Payer: Self-pay | Admitting: Internal Medicine

## 2021-05-16 DIAGNOSIS — E039 Hypothyroidism, unspecified: Secondary | ICD-10-CM | POA: Diagnosis not present

## 2021-05-16 DIAGNOSIS — E782 Mixed hyperlipidemia: Secondary | ICD-10-CM | POA: Diagnosis not present

## 2021-05-16 DIAGNOSIS — A310 Pulmonary mycobacterial infection: Secondary | ICD-10-CM | POA: Diagnosis not present

## 2021-05-16 DIAGNOSIS — B373 Candidiasis of vulva and vagina: Secondary | ICD-10-CM | POA: Diagnosis not present

## 2021-05-16 NOTE — Telephone Encounter (Signed)
Brief ID phone note:  Received [name]).  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  She initiated therapy with azithromycin followed by rifabutin.  This past Monday she started the third drug ethambutol.  She has been feeling terrible since Monday and has primarily stayed in bed with continued gastrointestinal symptoms and fatigue.  She states that she does not want to continue with therapy at this time.  Advised that this is a reasonable decision to make given how intolerant of the antibiotics she has been to this point.  Discussed that she can just go ahead and stop them and there is no need to wean her off the medications.  Discussed that I would be happy to see her sooner in clinic to discuss alternative antibiotic options but for now she wishes to discontinue altogether.  She will continue to follow-up with her pulmonologist to discuss airway clearance.  For now, I will see her again in August but sooner if she would like.   Raynelle Highland for [facility]ious [facility] 05/16/2021, 9:10 AM

## 2021-08-17 NOTE — Progress Notes (Signed)
[facility]  CHIEF COMPLAINT:    Follow up for NTM infection  SUBJECTIVE:    Tami Estrada is a 70 y.o. female with PMHx as below who presents to the clinic for NTM infection.   Please see A&P for the details of today's visit and status of the patient's medical problems.   Patient's Medications  New Prescriptions   No medications on file  Previous Medications   IBUPROFEN (ADVIL) 800 MG TABLET    Take 800 mg by mouth 3 (three) times daily.   RESPIRATORY THERAPY SUPPLIES (FLUTTER) DEVI    1 Device by Does not apply route as needed.   SODIUM CHLORIDE 0.9 % NEBULIZER SOLUTION    INHALE THE CONTENTS OF 1 VIAL BY NEBULIZATION IN THE MORNING AND AT BEDTIME.   SYNTHROID 50 MCG TABLET    Take 1 tablet (50 mcg total) by mouth as directed. Two tablets on sundays and 1 the rest of the week  Modified Medications   No medications on file  Discontinued Medications   AZITHROMYCIN (ZITHROMAX) 500 MG TABLET    Take 1 tablet (500 mg total) by mouth 3 (three) times a week.   ETHAMBUTOL (MYAMBUTOL) 400 MG TABLET    Take 3.5 tablets (1,400 mg total) by mouth 3 (three) times a week.   RIFABUTIN (MYCOBUTIN) 150 MG CAPSULE    Take 2 capsules (300 mg total) by mouth 3 (three) times a week.      Past Medical History:  Diagnosis Date   Abnormal findings on diagnostic imaging of other specified body structures    Bitten or stung by nonvenomous insect and other nonvenomous arthropods, initial encounter    Chest pain, unspecified    Disappearance and death of family member    GERD (gastroesophageal reflux disease)    Esophogeal dysmotility- 2010   History of Rocky Mountain spotted fever    Hypothyroidism    Other fatigue    Pain in right shoulder    Thyroid disease    Toxic effect of contact with other venomous plant, accidental (unintentional), initial encounter    Unspecified osteoarthritis, unspecified site     Social History   Tobacco Use   Smoking status:  Never   Smokeless tobacco: Never  Substance Use Topics   Alcohol use: No   Drug use: No    Family History  Problem Relation Age of Onset   Diabetes Sister    Cancer Sister    Pulmonary fibrosis Brother    Lung cancer Brother    COPD Daughter     Allergies  Allergen Reactions   Hydrocodone     Nausea/vomit    Review of Systems  Constitutional:  Negative for chills and fever.  Respiratory:  Positive for cough, sputum production and shortness of breath.   Cardiovascular:  Positive for chest pain.    OBJECTIVE:    Vitals:   08/18/21 0850  BP: (!) 176/66  Pulse: (!) 52  Temp: 97.7 F (36.5 C)  TempSrc: Oral  SpO2: 98%  Weight: 112 lb (50.8 kg)   Body mass index is 20.49 kg/m.  Physical Exam Constitutional:      General: She is not in acute distress.    Appearance: Normal appearance.  HENT:     Head: Normocephalic and atraumatic.  Cardiovascular:     Rate and Rhythm: Normal rate and regular rhythm.  Pulmonary:     Effort: Pulmonary effort is normal. No respiratory distress.  Breath sounds: Normal breath sounds.  Musculoskeletal:        General: Normal range of motion.  Skin:    General: Skin is warm and dry.     Findings: No rash.  Neurological:     General: No focal deficit present.     Mental Status: She is alert and oriented to person, place, and time.  Psychiatric:        Mood and Affect: Mood normal.        Behavior: Behavior normal.     Labs and Microbiology: CBC Latest Ref Rng & Units 04/21/2021 08/23/2019 02/22/2015  WBC 3.8 - 10.8 Thousand/uL 4.7 5.2 15.1(H)  Hemoglobin 11.7 - 15.5 g/dL 13.9 13.4 11.7(L)  Hematocrit 35.0 - 45.0 % 41.8 39 35.7(L)  Platelets 140 - 400 Thousand/uL 265 219 193   CMP Latest Ref Rng & Units 04/21/2021 10/06/2019 09/04/2019  Glucose 65 - 99 mg/dL 88 80 -  BUN 7 - 25 mg/dL 8 19 -  Creatinine 0.60 - 0.93 mg/dL 0.73 0.88 0.80  Sodium 135 - 146 mmol/L 141 143 -  Potassium 3.5 - 5.3 mmol/L 4.6 3.9 -  Chloride 98 - 110  mmol/L 105 105 -  CO2 20 - 32 mmol/L 29 28 -  Calcium 8.6 - 10.4 mg/dL 9.7 9.5 -  Total Protein 6.1 - 8.1 g/dL 7.3 - -  Total Bilirubin 0.2 - 1.2 mg/dL 0.6 - -  Alkaline Phos 25 - 125 - - -  AST 10 - 35 U/L 20 - -  ALT 6 - 29 U/L 12 - -        ASSESSMENT & PLAN:    Mycobacterium avium complex (HCC) Shortly after starting MAC treatment with azithromycin 577m TIW, rifabutin 3064mTIW, and ethambutol 2541mg TIW on ~ 04/22/21 separated by 1 week apart for each medication she had to stop treatment because of how poorly she felt.  I talked to her daughter on 05/16/21 and she discontinued her antibiotics altogether because of how poorly they made her feel.  Since then, she reports feeling overall about the same from a pulmonary standpoint although reports feeling shortness of breath with exertion and chest discomfort at site of prior surgery.  She states that she has some increased sputum production recently.  She is currently not doing anything for airway clearance.  She is concerned regarding progression of her MAC if this goes untreated.  Discussed possibility of trying another treatment regimen but I have concerns regarding medication toxicity and her ability to tolerate 12*-18 months of treatment and worry that treatment may do more harm than benefit since this does not appear to be hindering her daily life too much and her symptoms may be secondary more to bronchiectasis as opposed to MACAvondaleself.  She sees Dr DesShearon Stallsain next month and I asked her to discuss options for airway clearance to help her symptoms as well as whether she would benefit from pulmonary rehab given her chronic dyspnea.  Would also consider a repeat CT scan sometime in October (6 months from prior) to reassess whether there has been any progression and/or stability.  RTC 3 months after she has had a chance to try airway clearance and get follow up imaging.       AndRaynelle Highlandr Infectious Disease [facility] 08/18/2021, 9:25 AM   I spent 30 minutes dedicated to the care of this patient on the date of this encounter to include pre-visit review of records,  face-to-face time with the patient discussing MAC, and post-visit ordering of testing.

## 2021-08-18 ENCOUNTER — Other Ambulatory Visit: Payer: Self-pay

## 2021-08-18 ENCOUNTER — Ambulatory Visit: Payer: Medicare HMO | Admitting: Internal Medicine

## 2021-08-18 ENCOUNTER — Encounter: Payer: Self-pay | Admitting: Internal Medicine

## 2021-08-18 DIAGNOSIS — A310 Pulmonary mycobacterial infection: Secondary | ICD-10-CM

## 2021-08-18 NOTE — Patient Instructions (Signed)
Thank you for coming to see me today. It was a pleasure seeing you.  To Do: Follow up with lung doctor about discussing airway clearance techniques  Maybe pulmonary rehab would be a good option for her shortness of breath   If you have any questions or concerns, please do not hesitate to call the office at [phone].  Take Care,   Jule Ser

## 2021-08-18 NOTE — Assessment & Plan Note (Addendum)
Shortly after starting MAC treatment with azithromycin 550m TIW, rifabutin 3037mTIW, and ethambutol 2520mg TIW on ~ 04/22/21 separated by 1 week apart for each medication she had to stop treatment because of how poorly she felt.  I talked to her daughter on 05/16/21 and she discontinued her antibiotics altogether because of how poorly they made her feel.  Since then, she reports feeling overall about the same from a pulmonary standpoint although reports feeling shortness of breath with exertion and chest discomfort at site of prior surgery.  She states that she has some increased sputum production recently.  She is currently not doing anything for airway clearance.  She is concerned regarding progression of her MAC if this goes untreated.  Discussed possibility of trying another treatment regimen but I have concerns regarding medication toxicity and her ability to tolerate 12*-18 months of treatment and worry that treatment may do more harm than benefit since this does not appear to be hindering her daily life too much and her symptoms may be secondary more to bronchiectasis as opposed to MACCuyamungueself.  She sees Dr DesShearon Stallsain next month and I asked her to discuss options for airway clearance to help her symptoms as well as whether she would benefit from pulmonary rehab given her chronic dyspnea.  Would also consider a repeat CT scan sometime in October (6 months from prior) to reassess whether there has been any progression and/or stability.  RTC 3 months after she has had a chance to try airway clearance and get follow up imaging.

## 2021-09-10 ENCOUNTER — Ambulatory Visit: Payer: Medicare HMO | Admitting: Internal Medicine

## 2021-09-10 ENCOUNTER — Other Ambulatory Visit: Payer: Self-pay

## 2021-09-10 ENCOUNTER — Encounter: Payer: Self-pay | Admitting: Internal Medicine

## 2021-09-10 VITALS — BP 130/62 | HR 61 | Temp 98.1°F | Ht 62.0 in | Wt 110.4 lb

## 2021-09-10 DIAGNOSIS — A310 Pulmonary mycobacterial infection: Secondary | ICD-10-CM

## 2021-09-10 DIAGNOSIS — Z23 Encounter for immunization: Secondary | ICD-10-CM | POA: Diagnosis not present

## 2021-09-10 DIAGNOSIS — J479 Bronchiectasis, uncomplicated: Secondary | ICD-10-CM | POA: Diagnosis not present

## 2021-09-10 DIAGNOSIS — R0600 Dyspnea, unspecified: Secondary | ICD-10-CM | POA: Diagnosis not present

## 2021-09-10 NOTE — Patient Instructions (Signed)
Please schedule follow up scheduled with myself in 4 months.  If my schedule is not open yet, we will contact you with a reminder closer to that time.  We will refer you to pulmonary rehab. Keep up with your airway clearance at least once/day.

## 2021-09-10 NOTE — Progress Notes (Signed)
Tami Estrada    [id number]    Nov 26, 1951  [facility], Edwinna Areola, MD Date of Appointment: 09/10/2021 Established Patient Visit  Chief complaint:   Chief Complaint  Patient presents with   Follow-up    Chest hurts everyday, not as bad as the last time she was here.  It does not hurt all the time, hurts when she stands for a while and goes around to her back.  SOB is worse since she was here last.  Takes less movement to make her sob.     HPI: Tami Estrada is a 70 y.o. woman who initially presented to see me in spring 2020 with chest pain and dyspnea. Found to have pectus excavatum and I referred her for surgery to [facility] which she had in July 2021 Also incidental findings of MAI infection.  Interval Updates: Did not tolerate antibiotics for NTM therapy due to adverse effects   Having ongoing chest pain. Has pains that start in the center of her chest, radiate to her back feels like her post-operative pain. Worse with movement. She has not tried hot water bottles, heating pads or sports rubs for this. Takes ibuprofen as needed when it's unbearable.  Not as much mucus production with airway clearance which she is doing daily.  She walks about 15 minutes on the treadmill and gets out of breath. Her dyspnea does affect her daily activities.   I have reviewed the patient's family social and past medical history and updated as appropriate.   Past Medical History:  Diagnosis Date   Abnormal findings on diagnostic imaging of other specified body structures    Bitten or stung by nonvenomous insect and other nonvenomous arthropods, initial encounter    Chest pain, unspecified    Disappearance and death of family member    GERD (gastroesophageal reflux disease)    Esophogeal dysmotility- 2010   History of Rocky Mountain spotted fever    Hypothyroidism    Other fatigue    Pain in right shoulder    Thyroid disease    Toxic effect of contact with other venomous  plant, accidental (unintentional), initial encounter    Unspecified osteoarthritis, unspecified site     Past Surgical History:  Procedure Laterality Date   BACK SURGERY     lumbar disc   NECK SURGERY     cervical disc   SHOULDER ARTHROSCOPY WITH DISTAL CLAVICLE RESECTION Right 02/22/2015   Procedure: RIGHT SHOULDER ARTHROSCOPY WITH DISTAL CLAVICLE RESECTION, DEBRIDEMENT;  Surgeon: Carole Civil, MD;  Location: [facility];  Service: Orthopedics;  Laterality: Right;   SHOULDER OPEN ROTATOR CUFF REPAIR Right 02/22/2015   Procedure: ROTATOR CUFF REPAIR SHOULDER OPEN;  Surgeon: Carole Civil, MD;  Location: [facility];  Service: Orthopedics;  Laterality: Right;   THYROID SURGERY     thyroidectomy    Family History  Problem Relation Age of Onset   Diabetes Sister    Cancer Sister    Pulmonary fibrosis Brother    Lung cancer Brother    COPD Daughter     Social History   Occupational History   Not on file  Tobacco Use   Smoking status: Never   Smokeless tobacco: Never  Substance and Sexual Activity   Alcohol use: No   Drug use: No   Sexual activity: Yes    Birth control/protection: Post-menopausal     Physical Exam: Vitals:   09/10/21 1521  BP: 130/62  Pulse: 61  Temp: 98.1 F (36.7 C)  Height: 5\' 2"  (1.575 m)  Weight: 110 lb 6.4 oz (50.1 kg)  SpO2: 98%  TempSrc: Oral  BMI (Calculated): 20.19    Gen: thin CV: RRR no mrg Resp: Lungs clear, no wheezes or crackles MSK: chest pain not reproducible, s/p nuss procedure  Data Reviewed: Imaging: I have personally reviewed the ct chest april 2022 with MAI infection and pectus excavatum. There was mild progression of peribronchial nodules  in the RUL   PFTs:   PFT Results Latest Ref Rng & Units 04/17/2020  FVC-Pre L 2.51  FVC-Predicted Pre % 93  FVC-Post L 2.66  FVC-Predicted Post % 98  Pre FEV1/FVC % % 75  Post FEV1/FCV % % 77  FEV1-Pre L 1.89  FEV1-Predicted Pre % 92  FEV1-Post L 2.04  DLCO uncorrected  ml/min/mmHg 18.44  DLCO UNC% % 103  DLCO corrected ml/min/mmHg 33.62  DLCO COR %Predicted % 188  DLVA Predicted % 223  TLC L 4.87  TLC % Predicted % 104  RV % Predicted % 112   I have personally reviewed the patient's PFTs and they demonstrate restriction to ventilation  Labs: Sputum cultures  Immunization status: Immunization History  Administered Date(s) Administered   Fluad Quad(high Dose 65+) 09/11/2020, 09/10/2021   Influenza Inj Mdck Quad Pf 10/08/2016   Influenza Split 09/13/2012   Influenza, High Dose Seasonal PF 10/18/2018   Influenza,inj,Quad PF,6+ Mos 09/14/2019   Influenza-Unspecified 11/24/2013, 09/03/2014, 10/16/2015   Moderna Sars-Covid-2 Vaccination 02/01/2020, 03/01/2020   Pneumococcal Conjugate-13 08/10/2018   Pneumococcal Polysaccharide-23 10/16/2015    Assessment:  Pectus Excavatum s/p Nuss procedure July 2021 Bronchiectasis with MAI infection - unable to tolerate abx therapy.  Chest pain - possible post Nuss procedure  Plan/Recommendations: Continue airway clearance for bronchiectasis with flutter valve and normal saline.  Continue ibuprofen prn for chest pain Will refer to pulmonary rehab for ongoing dyspnea with minimal exertion. Will reach out to PMR to see if there are any therapies or suggestions regarding her post-surgical pain.   Return to Care: Return in about 4 months (around 01/10/2022).   Lenice Llamas, MD Pulmonary and [facility]

## 2021-09-15 ENCOUNTER — Telehealth: Payer: Self-pay | Admitting: Internal Medicine

## 2021-09-15 DIAGNOSIS — R0789 Other chest pain: Secondary | ICD-10-CM

## 2021-09-15 MED ORDER — LIDOCAINE 5 % EX PTCH: 1.0000 | MEDICATED_PATCH | Freq: Two times a day (BID) | CUTANEOUS | 1 refills | Status: AC

## 2021-09-15 NOTE — Telephone Encounter (Signed)
I spoke with the physical medicine and rehabilitation doctors. They suggested trying lidocaine patch on her chest where she is having the pain to see if this helps. I have called this into her pharmacy.  Please call patient and let her know.

## 2021-09-15 NOTE — Telephone Encounter (Signed)
Call made to patient, spoke with her daughter [name]), confirmed DOB. Made aware patches have been sent to pharmacy. Voiced understanding. Nothing further needed at this time.

## 2021-09-16 DIAGNOSIS — E039 Hypothyroidism, unspecified: Secondary | ICD-10-CM | POA: Diagnosis not present

## 2021-09-16 DIAGNOSIS — E782 Mixed hyperlipidemia: Secondary | ICD-10-CM | POA: Diagnosis not present

## 2021-09-18 DIAGNOSIS — E782 Mixed hyperlipidemia: Secondary | ICD-10-CM | POA: Diagnosis not present

## 2021-09-18 DIAGNOSIS — E039 Hypothyroidism, unspecified: Secondary | ICD-10-CM | POA: Diagnosis not present

## 2021-09-18 DIAGNOSIS — A310 Pulmonary mycobacterial infection: Secondary | ICD-10-CM | POA: Diagnosis not present

## 2021-09-18 DIAGNOSIS — Z0001 Encounter for general adult medical examination with abnormal findings: Secondary | ICD-10-CM | POA: Diagnosis not present

## 2021-09-19 DIAGNOSIS — E1165 Type 2 diabetes mellitus with hyperglycemia: Secondary | ICD-10-CM | POA: Diagnosis not present

## 2021-09-19 DIAGNOSIS — I10 Essential (primary) hypertension: Secondary | ICD-10-CM | POA: Diagnosis not present

## 2021-09-26 DIAGNOSIS — H524 Presbyopia: Secondary | ICD-10-CM | POA: Diagnosis not present

## 2021-09-26 DIAGNOSIS — H52223 Regular astigmatism, bilateral: Secondary | ICD-10-CM | POA: Diagnosis not present

## 2021-09-26 DIAGNOSIS — H5203 Hypermetropia, bilateral: Secondary | ICD-10-CM | POA: Diagnosis not present

## 2021-09-26 DIAGNOSIS — H2513 Age-related nuclear cataract, bilateral: Secondary | ICD-10-CM | POA: Diagnosis not present

## 2021-10-03 DIAGNOSIS — H2512 Age-related nuclear cataract, left eye: Secondary | ICD-10-CM | POA: Diagnosis not present

## 2021-10-03 DIAGNOSIS — H2511 Age-related nuclear cataract, right eye: Secondary | ICD-10-CM | POA: Diagnosis not present

## 2021-10-03 DIAGNOSIS — Z01818 Encounter for other preprocedural examination: Secondary | ICD-10-CM | POA: Diagnosis not present

## 2021-10-15 ENCOUNTER — Telehealth: Payer: Self-pay | Admitting: Pharmacy Technician

## 2021-10-15 ENCOUNTER — Other Ambulatory Visit (HOSPITAL_COMMUNITY): Payer: Self-pay

## 2021-10-15 NOTE — Telephone Encounter (Signed)
Patient Advocate Encounter  Received notification from [name] that prior authorization for LIDOCAINE 1% PATCH is required.   PA submitted on 10.26.22 Key [id number] Status is pending   [name] will continue to follow  Luciano Cutter, CPhT Patient Advocate Phone: [phone] Fax:  [phone]

## 2021-10-16 DIAGNOSIS — H2511 Age-related nuclear cataract, right eye: Secondary | ICD-10-CM | POA: Diagnosis not present

## 2021-10-16 NOTE — Telephone Encounter (Signed)
Received a fax regarding Prior Authorization from [facility] for LIDOCAINE 1% PATCH. Authorization has been DENIED because PT MUST HAVE Pain associated with post-herpetic neuralgia, B) Pain associated with diabetic neuropathy, C) Pain associated with cancer-related neuropathy (including treatment-related neuropathy [e.g., neuropathy associated with radiation treatment or chemotherapy

## 2021-10-22 NOTE — Telephone Encounter (Signed)
Ok. Unfortunately she doesn't have any of these diagnoses. Do they give any covered medications in the same drug class?

## 2021-10-23 NOTE — Telephone Encounter (Signed)
No other covered medications were given in the drug class.

## 2021-11-14 NOTE — Telephone Encounter (Signed)
error 

## 2021-11-17 ENCOUNTER — Ambulatory Visit: Payer: Medicare HMO | Admitting: Internal Medicine

## 2021-11-17 NOTE — Progress Notes (Deleted)
[facility] for Infectious Disease  CHIEF COMPLAINT:    Follow up for NTM infection  SUBJECTIVE:    Tami Estrada is a 70 y.o. female with PMHx as below who presents to the clinic for NTM infection.   Patient was last seen by me on 08/18/2021 for routine follow-up.  At that time, she had not tolerated MAC treatment when initiated in May 2022 and we decided to opt for conservative management with watchful waiting.  She saw her pulmonologist on 9/21 and was continued on airway clearance for her bronchiectasis and referred to pulmonary rehab for dyspnea with exertion.  She has had no repeat imaging since April 2022.  She has been doing daily airway clearance and not having as much mucus production or cough since doing so.  Please see A&P for the details of today's visit and status of the patient's medical problems.   Patient's Medications  New Prescriptions   No medications on file  Previous Medications   IBUPROFEN (ADVIL) 800 MG TABLET    Take 800 mg by mouth 3 (three) times daily.   RESPIRATORY THERAPY SUPPLIES (FLUTTER) DEVI    1 Device by Does not apply route as needed.   SODIUM CHLORIDE 0.9 % NEBULIZER SOLUTION    INHALE THE CONTENTS OF 1 VIAL BY NEBULIZATION IN THE MORNING AND AT BEDTIME.   SYNTHROID 50 MCG TABLET    Take 1 tablet (50 mcg total) by mouth as directed. Two tablets on sundays and 1 the rest of the week  Modified Medications   No medications on file  Discontinued Medications   No medications on file      Past Medical History:  Diagnosis Date   Abnormal findings on diagnostic imaging of other specified body structures    Bitten or stung by nonvenomous insect and other nonvenomous arthropods, initial encounter    Chest pain, unspecified    Disappearance and death of family member    GERD (gastroesophageal reflux disease)    Esophogeal dysmotility- 2010   History of Rocky Mountain spotted fever    Hypothyroidism    Other fatigue    Pain in right  shoulder    Thyroid disease    Toxic effect of contact with other venomous plant, accidental (unintentional), initial encounter    Unspecified osteoarthritis, unspecified site     Social History   Tobacco Use   Smoking status: Never   Smokeless tobacco: Never  Substance Use Topics   Alcohol use: No   Drug use: No    Family History  Problem Relation Age of Onset   Diabetes Sister    Cancer Sister    Pulmonary fibrosis Brother    Lung cancer Brother    COPD Daughter     Allergies  Allergen Reactions   Hydrocodone     Nausea/vomit    ROS   OBJECTIVE:    There were no vitals filed for this visit. There is no height or weight on file to calculate BMI.  Physical Exam   Labs and Microbiology: CBC Latest Ref Rng & Units 04/21/2021 08/23/2019 02/22/2015  WBC 3.8 - 10.8 Thousand/uL 4.7 5.2 15.1(H)  Hemoglobin 11.7 - 15.5 g/dL 13.9 13.4 11.7(L)  Hematocrit 35.0 - 45.0 % 41.8 39 35.7(L)  Platelets 140 - 400 Thousand/uL 265 219 193   CMP Latest Ref Rng & Units 04/21/2021 10/06/2019 09/04/2019  Glucose 65 - 99 mg/dL 88 80 -  BUN 7 - 25 mg/dL 8 19 -  Creatinine 0.60 - 0.93 mg/dL 0.73 0.88 0.80  Sodium 135 - 146 mmol/L 141 143 -  Potassium 3.5 - 5.3 mmol/L 4.6 3.9 -  Chloride 98 - 110 mmol/L 105 105 -  CO2 20 - 32 mmol/L 29 28 -  Calcium 8.6 - 10.4 mg/dL 9.7 9.5 -  Total Protein 6.1 - 8.1 g/dL 7.3 - -  Total Bilirubin 0.2 - 1.2 mg/dL 0.6 - -  Alkaline Phos 25 - 125 - - -  AST 10 - 35 U/L 20 - -  ALT 6 - 29 U/L 12 - -     No results found for this or any previous visit (from the past 240 hour(s)).  Imaging: ***   ASSESSMENT & PLAN:    No problem-specific Assessment & Plan notes found for this encounter.   No orders of the defined types were placed in this encounter.    There are no diagnoses linked to this encounter.  ***   Raynelle Highland for Infectious Disease [facility] 11/17/2021, 7:09 AM

## 2021-11-20 DIAGNOSIS — H2512 Age-related nuclear cataract, left eye: Secondary | ICD-10-CM | POA: Diagnosis not present

## 2021-12-26 ENCOUNTER — Other Ambulatory Visit: Payer: Self-pay | Admitting: Family Medicine

## 2021-12-26 ENCOUNTER — Other Ambulatory Visit (HOSPITAL_COMMUNITY): Payer: Self-pay | Admitting: Family Medicine

## 2021-12-26 DIAGNOSIS — A310 Pulmonary mycobacterial infection: Secondary | ICD-10-CM

## 2021-12-26 DIAGNOSIS — Z0100 Encounter for examination of eyes and vision without abnormal findings: Secondary | ICD-10-CM | POA: Diagnosis not present

## 2022-01-07 ENCOUNTER — Other Ambulatory Visit: Payer: Self-pay

## 2022-01-07 ENCOUNTER — Ambulatory Visit (HOSPITAL_COMMUNITY)
Admission: RE | Admit: 2022-01-07 | Discharge: 2022-01-07 | Disposition: A | Discharge status: Home / Self Care | Payer: Medicare HMO | Source: Ambulatory Visit | Attending: Family Medicine | Admitting: Family Medicine

## 2022-01-07 DIAGNOSIS — R911 Solitary pulmonary nodule: Secondary | ICD-10-CM | POA: Diagnosis not present

## 2022-01-07 DIAGNOSIS — J479 Bronchiectasis, uncomplicated: Secondary | ICD-10-CM | POA: Diagnosis not present

## 2022-01-07 DIAGNOSIS — A310 Pulmonary mycobacterial infection: Secondary | ICD-10-CM | POA: Diagnosis not present

## 2022-01-07 DIAGNOSIS — R0600 Dyspnea, unspecified: Secondary | ICD-10-CM | POA: Diagnosis not present

## 2022-01-07 DIAGNOSIS — I700 Atherosclerosis of aorta: Secondary | ICD-10-CM | POA: Diagnosis not present

## 2022-01-20 DIAGNOSIS — I10 Essential (primary) hypertension: Secondary | ICD-10-CM | POA: Diagnosis not present

## 2022-01-20 DIAGNOSIS — E782 Mixed hyperlipidemia: Secondary | ICD-10-CM | POA: Diagnosis not present

## 2022-01-21 ENCOUNTER — Telehealth: Payer: Self-pay | Admitting: Internal Medicine

## 2022-01-21 NOTE — Telephone Encounter (Signed)
Received CT Chest done at [facility] which shows pulmonary nodule, areas of infection. Needs follow up in the next few weeks

## 2022-01-21 NOTE — Telephone Encounter (Signed)
Patient is scheduled 01/26/2022 at 2:45pm with Dr. Shearon Stalls. Nothing further needed

## 2022-01-26 ENCOUNTER — Other Ambulatory Visit: Payer: Self-pay

## 2022-01-26 ENCOUNTER — Ambulatory Visit: Payer: Medicare HMO | Admitting: Internal Medicine

## 2022-01-26 ENCOUNTER — Encounter: Payer: Self-pay | Admitting: Internal Medicine

## 2022-01-26 VITALS — BP 122/64 | HR 68 | Temp 97.8°F | Ht 62.0 in | Wt 113.0 lb

## 2022-01-26 DIAGNOSIS — A310 Pulmonary mycobacterial infection: Secondary | ICD-10-CM | POA: Diagnosis not present

## 2022-01-26 DIAGNOSIS — Q766 Other congenital malformations of ribs: Secondary | ICD-10-CM

## 2022-01-26 DIAGNOSIS — J479 Bronchiectasis, uncomplicated: Secondary | ICD-10-CM | POA: Diagnosis not present

## 2022-01-26 DIAGNOSIS — Q740 Other congenital malformations of upper limb(s), including shoulder girdle: Secondary | ICD-10-CM

## 2022-01-26 MED ORDER — ALBUTEROL SULFATE HFA 108 (90 BASE) MCG/ACT IN AERS
2.0000 | INHALATION_SPRAY | Freq: Four times a day (QID) | RESPIRATORY_TRACT | 5 refills | Status: DC | PRN
Start: 2022-01-26 — End: 2022-07-08

## 2022-01-26 MED ORDER — ALBUTEROL SULFATE (2.5 MG/3ML) 0.083% IN NEBU: 2.5000 mg | INHALATION_SOLUTION | Freq: Four times a day (QID) | RESPIRATORY_TRACT | 5 refills | Status: DC | PRN

## 2022-01-26 NOTE — Progress Notes (Signed)
Tami Estrada    [id number]    1950/12/28  [facility], Edwinna Areola, MD Date of Appointment: 01/26/2022 Established Patient Visit  Chief complaint:   Chief Complaint  Patient presents with   Follow-up    SOB at times      HPI: Tami Estrada is a 71 y.o. woman who initially presented to see me in spring 2020 with chest pain and dyspnea. Found to have pectus excavatum and I referred her for surgery to [place] which she had in July 2021 Also incidental findings of MAI infection. 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 I did send her to ID to discuss triple therapy treatment which she was not able to tolerate due to side effects.   Interval Updates: Here for follow up. Feeling more short of breath.  No cough, no fevers, chills, night sweats, weight loss.  Tries going to the gym and walking on the treadmill.  Feels that airway clearance is not as effective as it once was.  PCP ordered a repeat CT scan done last month.   Chronic chest pain is stable, not worse.   I have reviewed the patient's family social and past medical history and updated as appropriate.   Past Medical History:  Diagnosis Date   Abnormal findings on diagnostic imaging of other specified body structures    Bitten or stung by nonvenomous insect and other nonvenomous arthropods, initial encounter    Chest pain, unspecified    Disappearance and death of family member    GERD (gastroesophageal reflux disease)    Esophogeal dysmotility- 2010   History of Rocky Mountain spotted fever    Hypothyroidism    Other fatigue    Pain in right shoulder    Thyroid disease    Toxic effect of contact with other venomous plant, accidental (unintentional), initial encounter    Unspecified osteoarthritis, unspecified site     Past Surgical History:  Procedure Laterality Date   BACK SURGERY     lumbar disc   NECK SURGERY     cervical disc   SHOULDER ARTHROSCOPY WITH DISTAL CLAVICLE RESECTION  Right 02/22/2015   Procedure: RIGHT SHOULDER ARTHROSCOPY WITH DISTAL CLAVICLE RESECTION, DEBRIDEMENT;  Surgeon: Carole Civil, MD;  Location: [facility];  Service: Orthopedics;  Laterality: Right;   SHOULDER OPEN ROTATOR CUFF REPAIR Right 02/22/2015   Procedure: ROTATOR CUFF REPAIR SHOULDER OPEN;  Surgeon: Carole Civil, MD;  Location: [facility];  Service: Orthopedics;  Laterality: Right;   THYROID SURGERY     thyroidectomy    Family History  Problem Relation Age of Onset   Diabetes Sister    Cancer Sister    Pulmonary fibrosis Brother    Lung cancer Brother    COPD Daughter     Social History   Occupational History   Not on file  Tobacco Use   Smoking status: Never   Smokeless tobacco: Never  Substance and Sexual Activity   Alcohol use: No   Drug use: No   Sexual activity: Yes    Birth control/protection: Post-menopausal     Physical Exam: Vitals:   01/26/22 1437  BP: 122/64  Pulse: 68  Temp: 97.8 F (36.6 C)  Height: _0  (1.575 m)  Weight: 113 lb (51.3 kg)  SpO2: 98%  TempSrc: Oral  BMI (Calculated): 20.66    Gen: thin, no distres CV: RRR no mrg Resp: ctab no wheezes or rackles  Data Reviewed: Imaging: I have personally reviewed the ct chest april 2022 with MAI infection and pectus excavatum. There was mild progression of peribronchial nodules  in the RUL   CT Chest done in Jan 2022 shows ongoing waxing/waning tree in bud opacities consistent with known peribronchial nodules  PFTs:   PFT Results Latest Ref Rng & Units 04/17/2020  FVC-Pre L 2.51  FVC-Predicted Pre % 93  FVC-Post L 2.66  FVC-Predicted Post % 98  Pre FEV1/FVC % % 75  Post FEV1/FCV % % 77  FEV1-Pre L 1.89  FEV1-Predicted Pre % 92  FEV1-Post L 2.04  DLCO uncorrected ml/min/mmHg 18.44  DLCO UNC% % 103  DLCO corrected ml/min/mmHg 33.62  DLCO COR %Predicted % 188  DLVA Predicted % 223  TLC L 4.87  TLC % Predicted % 104  RV % Predicted % 112   I have personally reviewed the  patient's PFTs and they demonstrate restriction to ventilation.  Spirometry shows FVC 2.1 L no airflow limitation.   Labs: Sputum cultures in feb 2022 positive for MAC  Immunization status: Immunization History  Administered Date(s) Administered   Fluad Quad(high Dose 65+) 09/11/2020, 09/10/2021   Influenza Inj Mdck Quad Pf 10/08/2016   Influenza Split 09/13/2012   Influenza, High Dose Seasonal PF 10/18/2018   Influenza,inj,Quad PF,6+ Mos 09/14/2019   Influenza-Unspecified 11/24/2013, 09/03/2014, 10/16/2015   Moderna Sars-Covid-2 Vaccination 02/01/2020, 03/01/2020   Pneumococcal Conjugate-13 08/10/2018   Pneumococcal Polysaccharide-23 10/16/2015    Assessment:  Pectus Excavatum s/p Nuss procedure July 2021 Bronchiectasis with MAI infection - unable to tolerate abx therapy, not improved.  Shortness of breath secondary to above  Plan/Recommendations: Continue airway clearance for bronchiectasis with flutter valve and normal saline.  I will add albuterol nebulizer prior to saline nebs for use with airway clearance. Prn albuterol with exertion.   I performed spirometry on her today which does show a drop in FVC, but is still within the normal range. We had a prolonged conversation about risks and benefits of abx therapy for MAI. At this point if she is doing well and we can maintain airway clearance hopefully that can keep the infection at [place].  I would repeat imaging if there is a dramatic change in her FVC or if there is a consideration she would start treatment based on worsening symptoms.   Return to Care: Return in about 6 months (around 07/26/2022).   Lenice Llamas, MD Pulmonary and [place]

## 2022-01-26 NOTE — Progress Notes (Signed)
The patient has been prescribed the inhaler albuterol. Inhaler technique was demonstrated to patient. The patient subsequently demonstrated correct technique.  

## 2022-01-26 NOTE — Patient Instructions (Addendum)
Please schedule follow up scheduled with myself in 6 months.  If my schedule is not open yet, we will contact you with a reminder closer to that time. Please call [phone] if you haven't heard from [place] a month before.   Start taking albuterol nebulizer treatments prior to your nebulized saline and flutter valve to help open up your lungs.  I will also give you an inhaler to keep with you when you're outside the home.   Take the albuterol rescue inhaler every 4 to 6 hours as needed for wheezing or shortness of breath. You can also take it 15 minutes before exercise or exertional activity. Side effects include heart racing or pounding, jitters or anxiety. If you have a history of an irregular heart rhythm, it can make this worse. Can also give some patients a hard time sleeping.

## 2022-02-16 ENCOUNTER — Ambulatory Visit: Payer: Medicare HMO | Admitting: Internal Medicine

## 2022-02-16 ENCOUNTER — Other Ambulatory Visit: Payer: Self-pay | Admitting: Internal Medicine

## 2022-02-16 NOTE — Progress Notes (Unsigned)
Name: Tami Estrada  MRN/ DOB: [id number], Dec 18, 1951    Age/ Sex: 71 y.o., female     PCP: Celene Squibb, MD   Reason for Endocrinology Evaluation: Hypothyroidism     Initial Endocrinology Clinic Visit: 04/01/2020    PATIENT IDENTIFIER: [name] is a 71 y.o., female with a past medical history of Hypothyroidism and pectus excavatum . She has followed with [facility] since 04/01/2020 for consultative assistance with management of her Hypothyroidism.   HISTORICAL SUMMARY: She is S/P thyroidectomy many years secondary to goiter. Was started on LT-4 replacement  at the time.     S/P pectus excavatum sx on 6/24th    SUBJECTIVE:    Today (08/05/2020):  Tami Estrada is here for a follow up on hypothyroidism. She is accompanied with daughter    Weight has been stable  Constipation has resolved  Denies depression or anxiety   No biotin use    HISTORY:  Past Medical History:  Past Medical History:  Diagnosis Date   Abnormal findings on diagnostic imaging of other specified body structures    Bitten or stung by nonvenomous insect and other nonvenomous arthropods, initial encounter    Chest pain, unspecified    Disappearance and death of family member    GERD (gastroesophageal reflux disease)    Esophogeal dysmotility- 2010   History of Rocky Mountain spotted fever    Hypothyroidism    Other fatigue    Pain in right shoulder    Thyroid disease    Toxic effect of contact with other venomous plant, accidental (unintentional), initial encounter    Unspecified osteoarthritis, unspecified site    Past Surgical History:  Past Surgical History:  Procedure Laterality Date   BACK SURGERY     lumbar disc   NECK SURGERY     cervical disc   SHOULDER ARTHROSCOPY WITH DISTAL CLAVICLE RESECTION Right 02/22/2015   Procedure: RIGHT SHOULDER ARTHROSCOPY WITH DISTAL CLAVICLE RESECTION, DEBRIDEMENT;  Surgeon: Carole Civil, MD;  Location: [facility];  Service:  Orthopedics;  Laterality: Right;   SHOULDER OPEN ROTATOR CUFF REPAIR Right 02/22/2015   Procedure: ROTATOR CUFF REPAIR SHOULDER OPEN;  Surgeon: Carole Civil, MD;  Location: [facility];  Service: Orthopedics;  Laterality: Right;   THYROID SURGERY     thyroidectomy   Social History:  reports that she has never smoked. She has never used smokeless tobacco. She reports that she does not drink alcohol and does not use drugs. Family History:  Family History  Problem Relation Age of Onset   Diabetes Sister    Cancer Sister    Pulmonary fibrosis Brother    Lung cancer Brother    COPD Daughter      HOME MEDICATIONS: Allergies as of 02/16/2022       Reactions   Hydrocodone    Nausea/vomit        Medication List        Accurate as of February 16, 2022 12:51 PM. If you have any questions, ask your nurse or doctor.          albuterol 108 (90 Base) MCG/ACT inhaler Commonly known as: VENTOLIN HFA Inhale 2 puffs into the lungs every 6 (six) hours as needed for wheezing or shortness of breath.   albuterol (2.5 MG/3ML) 0.083% nebulizer solution Commonly known as: PROVENTIL Take 3 mLs (2.5 mg total) by nebulization every 6 (six) hours as needed for wheezing or shortness of breath.   Flutter Devi 1 Device  by Does not apply route as needed.   ibuprofen 800 MG tablet Commonly known as: ADVIL Take 800 mg by mouth 3 (three) times daily.   sodium chloride 0.9 % nebulizer solution INHALE THE CONTENTS OF 1 VIAL BY NEBULIZATION IN THE MORNING AND AT BEDTIME.   Synthroid 50 MCG tablet Generic drug: levothyroxine TAKE 1 TABLET AS DIRECTED. TWO TABLETS ON SUNDAYS AND 1 THE REST OF THE WEEK What changed: See the new instructions. Changed by: Dorita Sciara, MD          OBJECTIVE:   PHYSICAL EXAM: TZ:GYFVC were no vitals taken for this visit.   EXAM: General: Pt appears well and is in NAD  Neck: General: Supple without adenopathy. Thyroid: Thyroid size normal.  No  goiter or nodules appreciated.   Lungs: Clear with good BS bilat with no rales, rhonchi, or wheezes  Heart: Auscultation: RRR.  Abdomen: Normoactive bowel sounds, soft, nontender, without masses or organomegaly palpable  Extremities:  BL LE: No pretibial edema normal ROM and strength.  Mental Status: Judgment, insight: Intact Orientation: Oriented to time, place, and person Memory: Intact for recent and remote events Mood and affect: No depression, anxiety, or agitation     DATA REVIEWED:  Results for DOMINIKA, LOSEY (MRN [id number]) as of 02/11/2021 12:57  Ref. Range 02/10/2021 13:40  TSH Latest Ref Range: 0.35 - 4.50 uIU/mL 4.86 (H)     ASSESSMENT / PLAN / RECOMMENDATIONS:   Postoperative Hypothyroidism:   - Pt is clinically euthyroid  - No local neck symptoms -- Pt educated extensively on the correct way to take levothyroxine (first thing in the morning with water, 30 minutes before eating or taking other medications). - Pt encouraged to double dose the following day if she were to miss a dose given long half-life of levothyroxine.     Medications   Change Synthroid 50 mcg  To two tablets on Sundays and 1 tablet the rest of the week    Labs in 8 weeks   F/U in 1 yr     Signed electronically by: Mack Guise, MD  [facility]  [address] [place], [place] [address] Phone: [phone] FAX: [phone]      CC: Celene Squibb, MD [place] [address] Phone: [phone]  Fax: [phone]   Return to Endocrinology clinic as below: Future Appointments  Date Time Provider [place]  02/16/2022  1:00 PM Annarae Macnair, Melanie Crazier, MD [id number] None

## 2022-03-19 DIAGNOSIS — E039 Hypothyroidism, unspecified: Secondary | ICD-10-CM | POA: Diagnosis not present

## 2022-03-19 DIAGNOSIS — E782 Mixed hyperlipidemia: Secondary | ICD-10-CM | POA: Diagnosis not present

## 2022-03-23 DIAGNOSIS — A310 Pulmonary mycobacterial infection: Secondary | ICD-10-CM | POA: Diagnosis not present

## 2022-03-23 DIAGNOSIS — E782 Mixed hyperlipidemia: Secondary | ICD-10-CM | POA: Diagnosis not present

## 2022-03-23 DIAGNOSIS — E039 Hypothyroidism, unspecified: Secondary | ICD-10-CM | POA: Diagnosis not present

## 2022-04-17 ENCOUNTER — Other Ambulatory Visit: Payer: Self-pay | Admitting: Internal Medicine

## 2022-05-21 ENCOUNTER — Other Ambulatory Visit: Payer: Self-pay | Admitting: Internal Medicine

## 2022-05-25 DIAGNOSIS — E782 Mixed hyperlipidemia: Secondary | ICD-10-CM | POA: Diagnosis not present

## 2022-05-25 DIAGNOSIS — E039 Hypothyroidism, unspecified: Secondary | ICD-10-CM | POA: Diagnosis not present

## 2022-06-11 DIAGNOSIS — Z961 Presence of intraocular lens: Secondary | ICD-10-CM | POA: Diagnosis not present

## 2022-06-11 DIAGNOSIS — H16223 Keratoconjunctivitis sicca, not specified as Sjogren's, bilateral: Secondary | ICD-10-CM | POA: Diagnosis not present

## 2022-06-11 DIAGNOSIS — H353131 Nonexudative age-related macular degeneration, bilateral, early dry stage: Secondary | ICD-10-CM | POA: Diagnosis not present

## 2022-06-16 ENCOUNTER — Other Ambulatory Visit: Payer: Self-pay | Admitting: Internal Medicine

## 2022-07-08 ENCOUNTER — Other Ambulatory Visit: Payer: Self-pay | Admitting: Internal Medicine

## 2022-09-07 DIAGNOSIS — H353131 Nonexudative age-related macular degeneration, bilateral, early dry stage: Secondary | ICD-10-CM | POA: Diagnosis not present

## 2022-09-07 DIAGNOSIS — Z961 Presence of intraocular lens: Secondary | ICD-10-CM | POA: Diagnosis not present

## 2022-09-16 DIAGNOSIS — E782 Mixed hyperlipidemia: Secondary | ICD-10-CM | POA: Diagnosis not present

## 2022-09-16 DIAGNOSIS — E039 Hypothyroidism, unspecified: Secondary | ICD-10-CM | POA: Diagnosis not present

## 2022-09-22 DIAGNOSIS — Z0000 Encounter for general adult medical examination without abnormal findings: Secondary | ICD-10-CM | POA: Diagnosis not present

## 2022-09-22 DIAGNOSIS — A310 Pulmonary mycobacterial infection: Secondary | ICD-10-CM | POA: Diagnosis not present

## 2022-09-22 DIAGNOSIS — Z23 Encounter for immunization: Secondary | ICD-10-CM | POA: Diagnosis not present

## 2022-09-22 DIAGNOSIS — E782 Mixed hyperlipidemia: Secondary | ICD-10-CM | POA: Diagnosis not present

## 2022-09-22 DIAGNOSIS — E039 Hypothyroidism, unspecified: Secondary | ICD-10-CM | POA: Diagnosis not present

## 2022-10-28 ENCOUNTER — Ambulatory Visit: Payer: Medicare HMO | Admitting: Internal Medicine

## 2022-10-28 ENCOUNTER — Encounter: Payer: Self-pay | Admitting: Internal Medicine

## 2022-10-28 VITALS — BP 132/72 | HR 63 | Temp 98.0°F | Ht 62.0 in | Wt 113.4 lb

## 2022-10-28 DIAGNOSIS — Q676 Pectus excavatum: Secondary | ICD-10-CM | POA: Diagnosis not present

## 2022-10-28 DIAGNOSIS — R0789 Other chest pain: Secondary | ICD-10-CM

## 2022-10-28 DIAGNOSIS — A310 Pulmonary mycobacterial infection: Secondary | ICD-10-CM | POA: Diagnosis not present

## 2022-10-28 DIAGNOSIS — G8929 Other chronic pain: Secondary | ICD-10-CM | POA: Diagnosis not present

## 2022-10-28 MED ORDER — GABAPENTIN 300 MG PO CAPS: 300.0000 mg | ORAL_CAPSULE | Freq: Three times a day (TID) | ORAL | 5 refills | Status: DC

## 2022-10-28 NOTE — Progress Notes (Signed)
Tami Estrada    [id number]    1951-01-08  [facility], Edwinna Areola, MD Date of Appointment: 10/28/2022 Established Patient Visit  Chief complaint:   Chief Complaint  Patient presents with   Follow-up    Cough, wheeze SOB persistent. Sx about the same as last seen in Feb 2023.     HPI: Tami Estrada is a 71 y.o. woman who initially presented to see me in spring 2020 with chest pain and dyspnea. Found to have pectus excavatum and I referred her for surgery to [place] which she had in July 2021 Also incidental findings of MAI infection.  Interval Updates: Here for follow up.  She continues to have chest pain which precedes her Nuss procedure and has persisted. Pain is at rest and with minimal exertion. Sharp and stabbing. Lasts for minutes, resolves spontaneously. Pain sometimes improved by ibuprofen.   Has a weak cough, continues to have shortness of breath, chest tightness, wheezing.  Coughs up mucus daily.   Has nebulizer machine with flutter valve which she uses about one a day.   Appetite is ok. No unintentional weight loss, fevers, chills, night sweats.     I have reviewed the patient's family social and past medical history and updated as appropriate.   Past Medical History:  Diagnosis Date   Abnormal findings on diagnostic imaging of other specified body structures    Bitten or stung by nonvenomous insect and other nonvenomous arthropods, initial encounter    Chest pain, unspecified    Disappearance and death of family member    GERD (gastroesophageal reflux disease)    Esophogeal dysmotility- 2010   History of Rocky Mountain spotted fever    Hypothyroidism    Other fatigue    Pain in right shoulder    Thyroid disease    Toxic effect of contact with other venomous plant, accidental (unintentional), initial encounter    Unspecified osteoarthritis, unspecified site     Past Surgical History:  Procedure Laterality Date   BACK SURGERY      lumbar disc   NECK SURGERY     cervical disc   SHOULDER ARTHROSCOPY WITH DISTAL CLAVICLE RESECTION Right 02/22/2015   Procedure: RIGHT SHOULDER ARTHROSCOPY WITH DISTAL CLAVICLE RESECTION, DEBRIDEMENT;  Surgeon: Carole Civil, MD;  Location: [facility];  Service: Orthopedics;  Laterality: Right;   SHOULDER OPEN ROTATOR CUFF REPAIR Right 02/22/2015   Procedure: ROTATOR CUFF REPAIR SHOULDER OPEN;  Surgeon: Carole Civil, MD;  Location: [facility];  Service: Orthopedics;  Laterality: Right;   THYROID SURGERY     thyroidectomy    Family History  Problem Relation Age of Onset   Diabetes Sister    Cancer Sister    Pulmonary fibrosis Brother    Lung cancer Brother    COPD Daughter     Social History   Occupational History   Not on file  Tobacco Use   Smoking status: Never   Smokeless tobacco: Never  Substance and Sexual Activity   Alcohol use: No   Drug use: No   Sexual activity: Yes    Birth control/protection: Post-menopausal     Physical Exam: Blood pressure 132/72, pulse 63, temperature 98 F (36.7 C), temperature source Oral, height '5\' 2"'$  (1.575 m), weight 113 lb 6.4 oz (51.4 kg), SpO2 97 %.  Gen:      No acute distress Lungs:    Surgical incision anteriorly is well healed. Improved appearnce of pectus excavatum.  CV:         Regular rate and rhythm; no murmurs, rubs, or gallops.  No pedal edema   Data Reviewed: Imaging: I have personally reviewed the ct chest march 2021 with MAI infection and pectus excavatum  PFTs:      Latest Ref Rng & Units 04/17/2020   11:43 AM  PFT Results  FVC-Pre L 2.51  P  FVC-Predicted Pre % 93  P  FVC-Post L 2.66  P  FVC-Predicted Post % 98  P  Pre FEV1/FVC % % 75  P  Post FEV1/FCV % % 77  P  FEV1-Pre L 1.89  P  FEV1-Predicted Pre % 92  P  FEV1-Post L 2.04  P  DLCO uncorrected ml/min/mmHg 18.44  P  DLCO UNC% % 103  P  DLCO corrected ml/min/mmHg 33.62  P  DLCO COR %Predicted % 188  P  DLVA Predicted % 223  P  TLC L 4.87  P  TLC  % Predicted % 104  P  RV % Predicted % 112  P    P Preliminary result   I have personally reviewed the patient's PFTs and they demonstrate restriction to ventilation  Labs:  Immunization status: Immunization History  Administered Date(s) Administered   Fluad Quad(high Dose 65+) 09/11/2020, 09/10/2021   Influenza Inj Mdck Quad Pf 10/08/2016   Influenza Split 09/13/2012   Influenza, High Dose Seasonal PF 10/18/2018   Influenza,inj,Quad PF,6+ Mos 09/14/2019   Influenza-Unspecified 11/24/2013, 09/03/2014, 10/16/2015, 10/07/2022   Moderna Sars-Covid-2 Vaccination 02/01/2020, 03/01/2020   Pneumococcal Conjugate-13 08/10/2018   Pneumococcal Polysaccharide-23 10/16/2015    Assessment:  Pectus Excavatum s/p Nuss procedure July 2021 MAI infection vs colonization Chronic chest wall pain, 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 Start once daily at night, then after a few days add a morning and afternoon dose gradually. Side effects to look for - confusion, sleepiness.   Try over the counter salonpas patches to help with the pain in addition to the advil and tylenol. Put the patch where you feel the pain.   If your breathing symptoms (cough, shortness of breath, mucus, wheezing) worsen, I want you to start increasing airway clearance with three times a day nebulizer therapy and flutter valve.  Call sooner if signs of fevers, chills, night sweats, weight loss, decreased appetite.   Return to Care: Return in about 6 months (around 04/28/2023).   Lenice Llamas, MD Pulmonary and [place]

## 2022-10-28 NOTE — Patient Instructions (Addendum)
Please schedule follow up scheduled with myself in 6 months.  If my schedule is not open yet, we will contact you with a reminder closer to that time. Please call [phone] if you haven't heard from [place] a month before.   Start taking gabapentin 300 mg up to three times/day. Start once daily at night, then after a few days add a morning and afternoon dose gradually. Side effects to look for - confusion, sleepiness.   Try over the counter salonpas patches to help with the pain in addition to the advil and tylenol. Put the patch where you feel the pain.   If your breathing symptoms (cough, shortness of breath, mucus, wheezing) worsen, I want you to start increasing airway clearance with three times a day nebulizer therapy and flutter valve.  Call sooner if signs of fevers, chills, night sweats, weight loss, decreased appetite.

## 2022-11-03 DIAGNOSIS — I700 Atherosclerosis of aorta: Secondary | ICD-10-CM | POA: Diagnosis not present

## 2022-11-03 DIAGNOSIS — A310 Pulmonary mycobacterial infection: Secondary | ICD-10-CM | POA: Diagnosis not present

## 2022-11-03 DIAGNOSIS — E039 Hypothyroidism, unspecified: Secondary | ICD-10-CM | POA: Diagnosis not present

## 2022-11-03 DIAGNOSIS — Q676 Pectus excavatum: Secondary | ICD-10-CM | POA: Diagnosis not present

## 2022-12-22 DIAGNOSIS — E039 Hypothyroidism, unspecified: Secondary | ICD-10-CM | POA: Diagnosis not present

## 2022-12-22 DIAGNOSIS — A310 Pulmonary mycobacterial infection: Secondary | ICD-10-CM | POA: Diagnosis not present

## 2022-12-22 DIAGNOSIS — Q676 Pectus excavatum: Secondary | ICD-10-CM | POA: Diagnosis not present

## 2022-12-22 DIAGNOSIS — R030 Elevated blood-pressure reading, without diagnosis of hypertension: Secondary | ICD-10-CM | POA: Diagnosis not present

## 2022-12-22 DIAGNOSIS — I700 Atherosclerosis of aorta: Secondary | ICD-10-CM | POA: Diagnosis not present

## 2023-01-22 DIAGNOSIS — E039 Hypothyroidism, unspecified: Secondary | ICD-10-CM | POA: Diagnosis not present

## 2023-01-22 DIAGNOSIS — A310 Pulmonary mycobacterial infection: Secondary | ICD-10-CM | POA: Diagnosis not present

## 2023-01-22 DIAGNOSIS — R030 Elevated blood-pressure reading, without diagnosis of hypertension: Secondary | ICD-10-CM | POA: Diagnosis not present

## 2023-01-22 DIAGNOSIS — Q676 Pectus excavatum: Secondary | ICD-10-CM | POA: Diagnosis not present

## 2023-02-22 DIAGNOSIS — Z961 Presence of intraocular lens: Secondary | ICD-10-CM | POA: Diagnosis not present

## 2023-02-22 DIAGNOSIS — H353131 Nonexudative age-related macular degeneration, bilateral, early dry stage: Secondary | ICD-10-CM | POA: Diagnosis not present

## 2023-02-22 DIAGNOSIS — H26499 Other secondary cataract, unspecified eye: Secondary | ICD-10-CM | POA: Diagnosis not present

## 2023-03-18 DIAGNOSIS — E039 Hypothyroidism, unspecified: Secondary | ICD-10-CM | POA: Diagnosis not present

## 2023-03-18 DIAGNOSIS — E782 Mixed hyperlipidemia: Secondary | ICD-10-CM | POA: Diagnosis not present

## 2023-03-23 DIAGNOSIS — Z0000 Encounter for general adult medical examination without abnormal findings: Secondary | ICD-10-CM | POA: Diagnosis not present

## 2023-03-24 DIAGNOSIS — E039 Hypothyroidism, unspecified: Secondary | ICD-10-CM | POA: Diagnosis not present

## 2023-03-24 DIAGNOSIS — E782 Mixed hyperlipidemia: Secondary | ICD-10-CM | POA: Diagnosis not present

## 2023-03-24 DIAGNOSIS — A310 Pulmonary mycobacterial infection: Secondary | ICD-10-CM | POA: Diagnosis not present

## 2023-03-24 DIAGNOSIS — R030 Elevated blood-pressure reading, without diagnosis of hypertension: Secondary | ICD-10-CM | POA: Diagnosis not present

## 2023-03-24 DIAGNOSIS — J309 Allergic rhinitis, unspecified: Secondary | ICD-10-CM | POA: Diagnosis not present

## 2023-03-24 DIAGNOSIS — Z6820 Body mass index (BMI) 20.0-20.9, adult: Secondary | ICD-10-CM | POA: Diagnosis not present

## 2023-03-24 DIAGNOSIS — Z7182 Exercise counseling: Secondary | ICD-10-CM | POA: Diagnosis not present

## 2023-03-24 DIAGNOSIS — I700 Atherosclerosis of aorta: Secondary | ICD-10-CM | POA: Diagnosis not present

## 2023-03-24 DIAGNOSIS — Z79890 Hormone replacement therapy (postmenopausal): Secondary | ICD-10-CM | POA: Diagnosis not present

## 2023-03-24 DIAGNOSIS — Q676 Pectus excavatum: Secondary | ICD-10-CM | POA: Diagnosis not present

## 2023-03-24 DIAGNOSIS — M954 Acquired deformity of chest and rib: Secondary | ICD-10-CM | POA: Diagnosis not present

## 2023-03-24 DIAGNOSIS — Z713 Dietary counseling and surveillance: Secondary | ICD-10-CM | POA: Diagnosis not present

## 2023-03-29 ENCOUNTER — Ambulatory Visit
Admission: EM | Admit: 2023-03-29 | Discharge: 2023-03-29 | Disposition: A | Discharge status: Home / Self Care | Payer: Medicare HMO | Attending: Internal Medicine | Admitting: Internal Medicine

## 2023-03-29 ENCOUNTER — Ambulatory Visit (INDEPENDENT_AMBULATORY_CARE_PROVIDER_SITE_OTHER): Payer: Medicare HMO

## 2023-03-29 ENCOUNTER — Emergency Department (HOSPITAL_COMMUNITY): Payer: Medicare HMO

## 2023-03-29 ENCOUNTER — Emergency Department (HOSPITAL_COMMUNITY)
Admission: EM | Admit: 2023-03-29 | Discharge: 2023-03-30 | Disposition: A | Discharge status: Home / Self Care | Payer: Medicare HMO | Attending: Emergency Medicine | Admitting: Emergency Medicine

## 2023-03-29 ENCOUNTER — Encounter (HOSPITAL_COMMUNITY): Payer: Self-pay

## 2023-03-29 ENCOUNTER — Telehealth: Payer: Self-pay

## 2023-03-29 ENCOUNTER — Other Ambulatory Visit: Payer: Self-pay

## 2023-03-29 ENCOUNTER — Encounter: Payer: Self-pay | Admitting: Internal Medicine

## 2023-03-29 DIAGNOSIS — R058 Other specified cough: Secondary | ICD-10-CM | POA: Insufficient documentation

## 2023-03-29 DIAGNOSIS — R911 Solitary pulmonary nodule: Secondary | ICD-10-CM | POA: Diagnosis not present

## 2023-03-29 DIAGNOSIS — N3000 Acute cystitis without hematuria: Secondary | ICD-10-CM

## 2023-03-29 DIAGNOSIS — A312 Disseminated mycobacterium avium-intracellulare complex (DMAC): Secondary | ICD-10-CM | POA: Diagnosis not present

## 2023-03-29 DIAGNOSIS — R079 Chest pain, unspecified: Secondary | ICD-10-CM | POA: Diagnosis not present

## 2023-03-29 DIAGNOSIS — R0602 Shortness of breath: Secondary | ICD-10-CM | POA: Insufficient documentation

## 2023-03-29 DIAGNOSIS — Z79899 Other long term (current) drug therapy: Secondary | ICD-10-CM | POA: Diagnosis not present

## 2023-03-29 DIAGNOSIS — R509 Fever, unspecified: Secondary | ICD-10-CM | POA: Diagnosis not present

## 2023-03-29 DIAGNOSIS — R059 Cough, unspecified: Secondary | ICD-10-CM

## 2023-03-29 DIAGNOSIS — E039 Hypothyroidism, unspecified: Secondary | ICD-10-CM | POA: Diagnosis not present

## 2023-03-29 DIAGNOSIS — Z20822 Contact with and (suspected) exposure to covid-19: Secondary | ICD-10-CM | POA: Diagnosis not present

## 2023-03-29 DIAGNOSIS — K5730 Diverticulosis of large intestine without perforation or abscess without bleeding: Secondary | ICD-10-CM | POA: Diagnosis not present

## 2023-03-29 DIAGNOSIS — A310 Pulmonary mycobacterial infection: Secondary | ICD-10-CM

## 2023-03-29 DIAGNOSIS — R042 Hemoptysis: Secondary | ICD-10-CM | POA: Diagnosis not present

## 2023-03-29 DIAGNOSIS — R519 Headache, unspecified: Secondary | ICD-10-CM | POA: Diagnosis not present

## 2023-03-29 DIAGNOSIS — I700 Atherosclerosis of aorta: Secondary | ICD-10-CM | POA: Diagnosis not present

## 2023-03-29 DIAGNOSIS — J479 Bronchiectasis, uncomplicated: Secondary | ICD-10-CM | POA: Diagnosis not present

## 2023-03-29 DIAGNOSIS — N3289 Other specified disorders of bladder: Secondary | ICD-10-CM | POA: Diagnosis not present

## 2023-03-29 LAB — CBC WITH DIFFERENTIAL/PLATELET
Abs Immature Granulocytes: 0.06 10*3/uL (ref 0.00–0.07)
Basophils Absolute: 0.1 10*3/uL (ref 0.0–0.1)
Basophils Relative: 1 %
Eosinophils Absolute: 0.2 10*3/uL (ref 0.0–0.5)
Eosinophils Relative: 2 %
HCT: 41.4 % (ref 36.0–46.0)
Hemoglobin: 14 g/dL (ref 12.0–15.0)
Immature Granulocytes: 1 %
Lymphocytes Relative: 19 %
Lymphs Abs: 1.8 10*3/uL (ref 0.7–4.0)
MCH: 31.1 pg (ref 26.0–34.0)
MCHC: 33.8 g/dL (ref 30.0–36.0)
MCV: 92 fL (ref 80.0–100.0)
Monocytes Absolute: 0.8 10*3/uL (ref 0.1–1.0)
Monocytes Relative: 9 %
Neutro Abs: 6.5 10*3/uL (ref 1.7–7.7)
Neutrophils Relative %: 68 %
Platelets: 258 10*3/uL (ref 150–400)
RBC: 4.5 MIL/uL (ref 3.87–5.11)
RDW: 12.6 % (ref 11.5–15.5)
WBC: 9.4 10*3/uL (ref 4.0–10.5)
nRBC: 0 % (ref 0.0–0.2)

## 2023-03-29 NOTE — Discharge Instructions (Addendum)
We will call you when the blood work is done In the mean time call her pulmonologist or primary care and tell them what is going on and she will need to have a chest cat scan to check on the nodule that is larger in her chest

## 2023-03-29 NOTE — ED Provider Notes (Addendum)
[facility]    CSN: [id number] Arrival date & time: 03/29/23  0804      History   Chief Complaint Chief Complaint  Patient presents with   Fever    HPI Tami Estrada is a 72 y.o. female presents with her daughter with onset of cough x 5 day with occasional hemptysis mixed with white sputum and at times with brown mucous. Has not had rhinitis, sneezing  or nose congestion. Has hx of pulmonary MAI x 3 years and normally gets an acute infection when she feels this way, but her infectious disease doctor told her there is nothing to do for treatment. She states this is the worse she has felt. Per her daughter she has had a fever of 100-104 at times, but at night time her temp goes back down to normal. Has been very fatigued and her appetite has been decreased. Her next pulmonologist appt is in May. Has Mid sternal chest pain that radiates to her R upper and mid chest with cough. She saw her PCP 5 days ago and was treated for allergies, but her symptoms have gotten worse.     Past Medical History:  Diagnosis Date   Abnormal findings on diagnostic imaging of other specified body structures    Bitten or stung by nonvenomous insect and other nonvenomous arthropods, initial encounter    Chest pain, unspecified    Disappearance and death of family member    GERD (gastroesophageal reflux disease)    Esophogeal dysmotility- 2010   History of [place] spotted fever    Hypothyroidism    Other fatigue    Pain in right shoulder    Thyroid disease    Toxic effect of contact with other venomous plant, accidental (unintentional), initial encounter    Unspecified osteoarthritis, unspecified site     Patient Active Problem List   Diagnosis Date Noted   Yeast infection 05/12/2021   Pectus excavatum 07/25/2020   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   Bronchiectasis without complication 07/25/2020   Elevated blood pressure reading 02/26/2020   Abnormal findings on diagnostic  imaging of other specified body structures    Chest pain, unspecified    Disappearance and death of family member    Other fatigue    Pain in right shoulder    Toxic effect of contact with other venomous plant, accidental (unintentional), initial encounter    Unspecified osteoarthritis, unspecified site    History of arthroscopy of right shoulder 02/25/2015   Rotator cuff tear    Arthritis, shoulder region    Nontraumatic rupture of right proximal biceps tendon    Common cold 12/20/2012   Back pain 12/20/2012   Dysuria 12/20/2012   Left knee pain 03/11/2012   Hypothyroidism 03/11/2012   Post-menopausal 03/11/2012    Past Surgical History:  Procedure Laterality Date   BACK SURGERY     lumbar disc   NECK SURGERY     cervical disc   SHOULDER ARTHROSCOPY WITH DISTAL CLAVICLE RESECTION Right 02/22/2015   Procedure: RIGHT SHOULDER ARTHROSCOPY WITH DISTAL CLAVICLE RESECTION, DEBRIDEMENT;  Surgeon: Vickki Hearing, MD;  Location: [facility];  Service: Orthopedics;  Laterality: Right;   SHOULDER OPEN ROTATOR CUFF REPAIR Right 02/22/2015   Procedure: ROTATOR CUFF REPAIR SHOULDER OPEN;  Surgeon: Vickki Hearing, MD;  Location: [facility];  Service: Orthopedics;  Laterality: Right;   THYROID SURGERY     thyroidectomy    OB History   No obstetric history on file.  Home Medications    Prior to Admission medications   Medication Sig Start Date End Date Taking? Authorizing Provider  albuterol (PROVENTIL) (2.5 MG/3ML) 0.083% nebulizer solution Take 3 mLs (2.5 mg total) by nebulization every 6 (six) hours as needed for wheezing or shortness of breath. 01/26/22   Charlott Holler, MD  albuterol (VENTOLIN HFA) 108 (90 Base) MCG/ACT inhaler TAKE 2 PUFFS BY MOUTH EVERY 6 HOURS AS NEEDED FOR WHEEZE OR SHORTNESS OF BREATH 07/08/22   Charlott Holler, MD  gabapentin (NEURONTIN) 300 MG capsule Take 1 capsule (300 mg total) by mouth 3 (three) times daily. 10/28/22   Charlott Holler, MD  ibuprofen (ADVIL)  800 MG tablet Take 800 mg by mouth 3 (three) times daily.    [provider]  Respiratory Therapy Supplies (FLUTTER) DEVI 1 Device by Does not apply route as needed. 04/25/20   Charlott Holler, MD  sodium chloride 0.9 % nebulizer solution INHALE THE CONTENTS OF 1 VIAL BY NEBULIZATION IN THE MORNING AND AT BEDTIME. 07/08/22   Charlott Holler, MD  SYNTHROID 75 MCG tablet Take 75 mcg by mouth daily.    [provider]    Family History Family History  Problem Relation Age of Onset   Diabetes Sister    Cancer Sister    Pulmonary fibrosis Brother    Lung cancer Brother    COPD Daughter     Social History Social History   Tobacco Use   Smoking status: Never   Smokeless tobacco: Never  Substance Use Topics   Alcohol use: No   Drug use: No     Allergies   Hydrocodone   Review of Systems Review of Systems  Constitutional:  Positive for activity change, appetite change, chills, fatigue and fever. Negative for diaphoresis.  HENT:  Negative for congestion, ear discharge, ear pain, postnasal drip, sore throat, trouble swallowing and voice change.   Eyes:  Negative for discharge.  Respiratory:  Positive for cough and shortness of breath. Negative for chest tightness and wheezing.   Cardiovascular:  Negative for chest pain and leg swelling.  Genitourinary:  Negative for dysuria and frequency.  Musculoskeletal:  Negative for myalgias.  Neurological:  Negative for headaches.       Has felt a little off balane  Hematological:  Negative for adenopathy.     Physical Exam Triage Vital Signs ED Triage Vitals  Enc Vitals Group     BP 03/29/23 0814 137/67     Pulse Rate 03/29/23 0814 78     Resp 03/29/23 0814 15     Temp 03/29/23 0814 98 F (36.7 C)     Temp Source 03/29/23 0814 Oral     SpO2 03/29/23 0814 96 %     Weight --      Height --      Head Circumference --      Peak Flow --      Pain Score 03/29/23 0819 10     Pain Loc --      Pain Edu? --      Excl.  in GC? --    No data found.  Updated Vital Signs BP 137/67 (BP Location: Right Arm)   Pulse 78   Temp 98 F (36.7 C) (Oral)   Resp 15   SpO2 96%   Visual Acuity Right Eye Distance:   Left Eye Distance:   Bilateral Distance:    Right Eye Near:   Left Eye Near:    Bilateral  Near:     Physical Exam Physical Exam Vitals signs and nursing note reviewed.  Constitutional:      General: She is not in acute distress.    Appearance: Normal appearance. She is not ill-appearing, toxic-appearing or diaphoretic.  HENT:     Head: Normocephalic.     Right Ear: Tympanic membrane, ear canal and external ear normal.     Left Ear: Tympanic membrane, ear canal and external ear normal.     Nose: Nose normal.     Mouth/Throat: clear    Mouth: Mucous membranes are moist.  Eyes:     General: No scleral icterus.       Right eye: No discharge.        Left eye: No discharge.     Conjunctiva/sclera: Conjunctivae normal.  Neck:     Musculoskeletal: Neck supple. No neck rigidity.  Cardiovascular:     Rate and Rhythm: Normal rate and regular rhythm.     Heart sounds: No murmur.  Pulmonary:     Effort: Pulmonary effort is normal.     Breath sounds: Normal breath sounds. Has tender L mid chest wall, but does not reproduce the pain she feels when she coughs.  Musculoskeletal: Normal range of motion.  Lymphadenopathy:     Cervical: No cervical adenopathy.  Skin:    General: Skin is warm and dry.     Coloration: Skin is not jaundiced.     Findings: No rash.  Neurological:     Mental Status: She is alert and oriented to person, place, and time.     Gait: Gait normal.  Psychiatric:        Mood and Affect: Mood normal.        Behavior: Behavior normal.        Thought Content: Thought content normal.        Judgment: Judgment normal.    UC Treatments / Results  Labs (all labs ordered are listed, but only abnormal results are displayed) Labs Reviewed  CBC WITH DIFFERENTIAL/PLATELET   COMPREHENSIVE METABOLIC PANEL    EKG   Radiology DG Chest 2 View  Result Date: 03/29/2023 CLINICAL DATA:  R chest pain with cough, hemoptysis, fever and hx of MAI EXAM: CHEST - 2 VIEW COMPARISON:  CT chest 01/07/2022, chest x-ray 10/28/2020 FINDINGS: The heart and mediastinal contours are within normal limits. A right upper lobe peripheral nodular-like airspace opacity measuring approximately 2.5 x 0.8 cm. Persistent interstitial and airspace opacities along the lung apices. No pulmonary edema. No pleural effusion. No pneumothorax. No acute osseous abnormality. IMPRESSION: A right upper lobe peripheral nodular-like airspace opacity measuring approximately 2.5 x 0.8 cm. Persistent interstitial and airspace opacities along the lung apices. Given previously identified similar region smaller right upper lobe pulmonary nodule, recommend CT chest with intravenous contrast for further evaluation. Electronically Signed   By: Tish Frederickson M.D.   On: 03/29/2023 08:43    Procedures Procedures (including critical care time)  Medications Ordered in UC Medications - No data to display  Initial Impression / Assessment and Plan / UC Course  I have reviewed the triage vital signs and the nursing notes.  Pertinent reviewed by me and considered in my medical decision making (see chart for details).  Hemoptysis with enlarged R chest nodule  I ordered a CBC with diff, to see if her fever could be bacterial and if elevated she will need to go to ER. As of 4 pm,. The CBC result is not back yet. I  have her daughter the report of the CXR and she will go to pt's PCP or pulmonologist today and take them this report so they can order the chest CT that is recommended.    Final Clinical Impressions(s) / UC Diagnoses   Final diagnoses:  Fever, unspecified     Discharge Instructions      We will call you when the blood work is done In the mean time call her pulmonologist or primary care and tell them what  is going on and she will need to have a chest cat scan to check on the nodule that is larger in her chest      ED Prescriptions   None    PDMP not reviewed this encounter.   Garey HamRodriguez-Southworth, Zanetta Dehaan, PA-C 03/29/23 1554    Rodriguez-Southworth, [place], PA-C 03/29/23 1555

## 2023-03-29 NOTE — Telephone Encounter (Addendum)
I ordered a ct chest and she can follow up as scheduled. If I have a sooner appointment you can offer that.

## 2023-03-29 NOTE — ED Triage Notes (Signed)
Pt c/o fever and chills, per pt daughter she has fever between 100.0-104.0  Then gradually drops back to normal at night.  Pt states that she has chest pain around her lungs S.O.B, x 6 days

## 2023-03-29 NOTE — ED Triage Notes (Signed)
Pt BIB daughter after being seen at [facility] today, told to come to ED for further evaluation. Pt has had cold/flu symptoms x 6 days.   No antipyretic medications given today.   Denies any other sick contacts. Pt A & O x 4, ambulatory in triage.

## 2023-03-29 NOTE — Telephone Encounter (Signed)
Provider wanted to advise patient to go to the ER today if she was unable to see her PCP or pulmonologist today. Due to [place] not able to get blood work back today. Advised daughter who cares for patient about the providers treatment plan for her to go to the ER to have a ct scan today to check for infection and blood work. Patient daughter understood and states she would talk with her and try to get her to go today. Updated provider Nettie Elm.

## 2023-03-29 NOTE — Addendum Note (Signed)
Addended by: Durel Salts on: 03/29/2023 04:59 PM   Modules accepted: Orders

## 2023-03-29 NOTE — Telephone Encounter (Signed)
Pt had CXR ran over weekend which is viewable in EPIC. Pt is scheduled for OV on 04/26/23. Would you like OV to be sooner or to have a CT before OV?

## 2023-03-30 ENCOUNTER — Other Ambulatory Visit: Payer: Self-pay

## 2023-03-30 DIAGNOSIS — K5730 Diverticulosis of large intestine without perforation or abscess without bleeding: Secondary | ICD-10-CM | POA: Diagnosis not present

## 2023-03-30 DIAGNOSIS — N3289 Other specified disorders of bladder: Secondary | ICD-10-CM | POA: Diagnosis not present

## 2023-03-30 DIAGNOSIS — J479 Bronchiectasis, uncomplicated: Secondary | ICD-10-CM | POA: Diagnosis not present

## 2023-03-30 DIAGNOSIS — R519 Headache, unspecified: Secondary | ICD-10-CM | POA: Diagnosis not present

## 2023-03-30 LAB — COMPREHENSIVE METABOLIC PANEL
ALT: 14 IU/L (ref 0–32)
ALT: 15 U/L (ref 0–44)
AST: 19 IU/L (ref 0–40)
AST: 22 U/L (ref 15–41)
Albumin/Globulin Ratio: 1.3 (ref 1.2–2.2)
Albumin: 3.9 g/dL (ref 3.5–5.0)
Albumin: 4.1 g/dL (ref 3.8–4.8)
Alkaline Phosphatase: 105 U/L (ref 38–126)
Alkaline Phosphatase: 123 IU/L — ABNORMAL HIGH (ref 44–121)
Anion gap: 12 (ref 5–15)
BUN/Creatinine Ratio: 9 — ABNORMAL LOW (ref 12–28)
BUN: 11 mg/dL (ref 8–23)
BUN: 7 mg/dL — ABNORMAL LOW (ref 8–27)
Bilirubin Total: 0.8 mg/dL (ref 0.0–1.2)
CO2: 24 mmol/L (ref 20–29)
CO2: 24 mmol/L (ref 22–32)
Calcium: 9.4 mg/dL (ref 8.9–10.3)
Calcium: 9.7 mg/dL (ref 8.7–10.3)
Chloride: 100 mmol/L (ref 98–111)
Chloride: 99 mmol/L (ref 96–106)
Creatinine, Ser: 0.74 mg/dL (ref 0.57–1.00)
Creatinine, Ser: 0.75 mg/dL (ref 0.44–1.00)
GFR, Estimated: >60 mL/min (ref >60)
Globulin, Total: 3.1 g/dL (ref 1.5–4.5)
Glucose, Bld: 99 mg/dL (ref 70–99)
Glucose: 102 mg/dL — ABNORMAL HIGH (ref 70–99)
Potassium: 3.6 mmol/L (ref 3.5–5.1)
Potassium: 4.4 mmol/L (ref 3.5–5.2)
Sodium: 136 mmol/L (ref 135–145)
Sodium: 139 mmol/L (ref 134–144)
Total Bilirubin: 1.2 mg/dL (ref 0.3–1.2)
Total Protein: 7.2 g/dL (ref 6.0–8.5)
Total Protein: 8.3 g/dL — ABNORMAL HIGH (ref 6.5–8.1)
eGFR: 85 mL/min/{1.73_m2} (ref >59)

## 2023-03-30 LAB — CBC WITH DIFFERENTIAL/PLATELET
Basophils Absolute: 0.1 10*3/uL (ref 0.0–0.2)
Basos: 1 %
EOS (ABSOLUTE): 0.1 10*3/uL (ref 0.0–0.4)
Eos: 2 %
Hematocrit: 41.3 % (ref 34.0–46.6)
Hemoglobin: 13.8 g/dL (ref 11.1–15.9)
Immature Grans (Abs): 0.1 10*3/uL (ref 0.0–0.1)
Immature Granulocytes: 1 %
Lymphocytes Absolute: 1.5 10*3/uL (ref 0.7–3.1)
Lymphs: 19 %
MCH: 30.9 pg (ref 26.6–33.0)
MCHC: 33.4 g/dL (ref 31.5–35.7)
MCV: 93 fL (ref 79–97)
Monocytes Absolute: 0.8 10*3/uL (ref 0.1–0.9)
Monocytes: 10 %
Neutrophils Absolute: 5.4 10*3/uL (ref 1.4–7.0)
Neutrophils: 67 %
Platelets: 252 10*3/uL (ref 150–450)
RBC: 4.46 x10E6/uL (ref 3.77–5.28)
RDW: 12.7 % (ref 11.7–15.4)
WBC: 8 10*3/uL (ref 3.4–10.8)

## 2023-03-30 LAB — TSH: TSH: 6.364 u[IU]/mL — ABNORMAL HIGH (ref 0.350–4.500)

## 2023-03-30 LAB — RESP PANEL BY RT-PCR (RSV, FLU A&B, COVID)  RVPGX2
Influenza A by PCR: NEGATIVE
Influenza B by PCR: NEGATIVE
Resp Syncytial Virus by PCR: NEGATIVE
SARS Coronavirus 2 by RT PCR: NEGATIVE

## 2023-03-30 LAB — URINALYSIS, ROUTINE W REFLEX MICROSCOPIC
Bilirubin Urine: NEGATIVE
Glucose, UA: NEGATIVE mg/dL
Ketones, ur: 20 mg/dL — AB
Nitrite: NEGATIVE
Protein, ur: 30 mg/dL — AB
Specific Gravity, Urine: 1.024 (ref 1.005–1.030)
Trans Epithel, UA: 3
WBC, UA: >50 WBC/hpf (ref 0–5)
pH: 5 (ref 5.0–8.0)

## 2023-03-30 LAB — CULTURE, BLOOD (ROUTINE X 2): Culture: NO GROWTH

## 2023-03-30 LAB — LACTIC ACID, PLASMA: Lactic Acid, Venous: 1.1 mmol/L (ref 0.5–1.9)

## 2023-03-30 LAB — T4, FREE: Free T4: 1.15 ng/dL — ABNORMAL HIGH (ref 0.61–1.12)

## 2023-03-30 MED ORDER — LEVOFLOXACIN 500 MG PO TABS: 500.0000 mg | ORAL_TABLET | Freq: Every day | ORAL | 0 refills | Status: DC

## 2023-03-30 MED ORDER — LEVOFLOXACIN 750 MG PO TABS
750.0000 mg | ORAL_TABLET | Freq: Once | ORAL | Status: AC
  Administered 2023-03-30: 750 mg via ORAL
  Filled 2023-03-30: qty 1

## 2023-03-30 MED ORDER — IOHEXOL 300 MG/ML  SOLN
100.0000 mL | Freq: Once | INTRAMUSCULAR | Status: AC | PRN
  Administered 2023-03-30: 100 mL via INTRAVENOUS

## 2023-03-30 MED ORDER — IOHEXOL 300 MG/ML  SOLN: 75.0000 mL | Freq: Once | INTRAMUSCULAR | Status: DC | PRN

## 2023-03-30 NOTE — ED Notes (Signed)
With CT 

## 2023-03-30 NOTE — ED Provider Notes (Signed)
[place] EMERGENCY DEPARTMENT AT [facility] Provider Note   CSN: [id number] Arrival date & time: 03/29/23  1704     History  No chief complaint on file.   Tami Estrada is a 72 y.o. female.  72 year old female with history of MAI and hypothyroidism who presents ER today secondary to worsening cough and fevers and afternoon.  States that they get better throughout the  night and they are fine by the morning but every day for the last 4 days she had a fever in the afternoon.  Worsening productive cough as well.  Some dysuria.  Is been told that there is not anything [place] can do for her MAI since she has been treated with the appropriate antibiotics and did not really tolerate them very well.  Is also a bit short of breath.        Home Medications Prior to Admission medications   Medication Sig Start Date End Date Taking? Authorizing Provider  levofloxacin (LEVAQUIN) 500 MG tablet Take 1 tablet (500 mg total) by mouth daily. 03/31/23  Yes Miaya Lafontant, Barbara Cower, MD  albuterol (PROVENTIL) (2.5 MG/3ML) 0.083% nebulizer solution Take 3 mLs (2.5 mg total) by nebulization every 6 (six) hours as needed for wheezing or shortness of breath. 01/26/22   Charlott Holler, MD  albuterol (VENTOLIN HFA) 108 (90 Base) MCG/ACT inhaler TAKE 2 PUFFS BY MOUTH EVERY 6 HOURS AS NEEDED FOR WHEEZE OR SHORTNESS OF BREATH 07/08/22   Charlott Holler, MD  gabapentin (NEURONTIN) 300 MG capsule Take 1 capsule (300 mg total) by mouth 3 (three) times daily. 10/28/22   Charlott Holler, MD  ibuprofen (ADVIL) 800 MG tablet Take 800 mg by mouth 3 (three) times daily.    [provider]  Respiratory Therapy Supplies (FLUTTER) DEVI 1 Device by Does not apply route as needed. 04/25/20   Charlott Holler, MD  sodium chloride 0.9 % nebulizer solution INHALE THE CONTENTS OF 1 VIAL BY NEBULIZATION IN THE MORNING AND AT BEDTIME. 07/08/22   Charlott Holler, MD  SYNTHROID 75 MCG tablet Take 75 mcg by mouth daily.    [provider]      Allergies    Hydrocodone    Review of Systems   Review of Systems  Physical Exam Updated Vital Signs BP (!) 148/71   Pulse 78   Temp 98.1 F (36.7 C) (Oral)   Resp 13   Ht 5\' 2"  (1.575 m)   Wt 52.2 kg   SpO2 98%   BMI 21.03 kg/m  Physical Exam Vitals and nursing note reviewed.  Constitutional:      Appearance: She is well-developed.  HENT:     Head: Normocephalic and atraumatic.     Mouth/Throat:     Mouth: Mucous membranes are moist.     Pharynx: Oropharynx is clear.  Cardiovascular:     Rate and Rhythm: Normal rate and regular rhythm.  Pulmonary:     Effort: No respiratory distress.     Breath sounds: No stridor.  Abdominal:     General: Abdomen is flat. There is no distension.  Musculoskeletal:        General: No swelling. Normal range of motion.     Cervical back: Normal range of motion.  Skin:    General: Skin is warm and dry.  Neurological:     General: No focal deficit present.     Mental Status: She is alert.     ED Results / Procedures /  Treatments   Labs (all labs ordered are listed, but only abnormal results are displayed) Labs Reviewed  COMPREHENSIVE METABOLIC PANEL - Abnormal; Notable for the following components:      Result Value   Total Protein 8.3 (*)    All other components within normal limits  TSH - Abnormal; Notable for the following components:   TSH 6.364 (*)    All other components within normal limits  URINALYSIS, ROUTINE W REFLEX MICROSCOPIC - Abnormal; Notable for the following components:   Color, Urine AMBER (*)    APPearance HAZY (*)    Hgb urine dipstick SMALL (*)    Ketones, ur 20 (*)    Protein, ur 30 (*)    Leukocytes,Ua LARGE (*)    Bacteria, UA RARE (*)    All other components within normal limits  RESP PANEL BY RT-PCR (RSV, FLU A&B, COVID)  [id number]  CULTURE, BLOOD (ROUTINE X 2)  CULTURE, BLOOD (ROUTINE X 2)  CBC WITH DIFFERENTIAL/PLATELET  LACTIC ACID, PLASMA  T4, FREE     EKG None  Radiology CT CHEST ABDOMEN PELVIS W CONTRAST  Result Date: 03/30/2023 CLINICAL DATA:  Cough for 5 days with occasional hemoptysis mixed with white sputum and at times brown mucus. Fever. Fatigue and decreased appetite. Midsternal chest pain. EXAM: CT CHEST, ABDOMEN, AND PELVIS WITH CONTRAST TECHNIQUE: Multidetector CT imaging of the chest, abdomen and pelvis was performed following the standard protocol during bolus administration of intravenous contrast. RADIATION DOSE REDUCTION: This exam was performed according to the departmental dose-optimization program which includes automated exposure control, adjustment of the mA and/or kV according to patient size and/or use of iterative reconstruction technique. CONTRAST:  100mL OMNIPAQUE IOHEXOL 300 MG/ML  SOLN COMPARISON:  Radiographs 03/29/2023; CT chest 01/07/2022 FINDINGS: CT CHEST FINDINGS Cardiovascular: Normal heart size. No pericardial effusion. Coronary artery and aortic atherosclerotic calcification. No central pulmonary embolism. Mediastinum/Nodes: Unremarkable esophagus.  No thoracic adenopathy. Lungs/Pleura: Biapical pleural-parenchymal scarring. Scattered areas of architectural distortion and parenchymal scarring. Increased peripheral patchy ground-glass opacities, centrilobular micro nodules, and tree-in-bud opacities compared with 01/07/2022. No pleural effusion or pneumothorax. Progressed atelectasis/consolidation in the lingula with associated mucous plugging and cylindrical bronchiectasis/bronchiolectasis. Bronchiectasis/bronchiolectasis in the right middle lobe. Increased size of the focal nodular consolidation along the right major fissure now measuring 16 x 12 mm (4/51). Similar 9 mm nodule in the left upper lobe (4/57), stable since 2021 and benign, possibly due to mucoid impaction. Musculoskeletal: No acute osseous abnormality.  Pectus excavatum. CT ABDOMEN PELVIS FINDINGS Hepatobiliary: Mild intrahepatic biliary dilation.  Normal caliber common bile duct. Unremarkable gallbladder. No focal hepatic lesion. Pancreas: Unremarkable. Spleen: Unremarkable. Adrenals/Urinary Tract: Normal adrenal glands. No urinary calculi or hydronephrosis. Diffuse bladder wall thickening. Stomach/Bowel: Colonic diverticulosis without diverticulitis. Normal caliber large and small bowel. Normal appendix. Stomach is within normal limits. Vascular/Lymphatic: Aortic atherosclerosis. No enlarged abdominal or pelvic lymph nodes. Reproductive: Uterus and bilateral adnexa are unremarkable. Other: No free intraperitoneal fluid or air. Musculoskeletal: No acute osseous abnormality. IMPRESSION: 1. Pulmonary parenchymal findings compatible with slightly worsened chronic airway infection/inflammation possibly due to atypical mycobacterial infection. 2. Increased size of the focal nodular consolidation along the right major fissure now measuring 16 x 12 mm. This is likely related to worsening infection however follow-up in 3-4 months is recommended to ensure resolution. 3. Diffuse bladder wall thickening. Correlate with urinalysis for cystitis. 4. Colonic diverticulosis without diverticulitis. Aortic Atherosclerosis (ICD10-I70.0). Electronically Signed   By: Minerva Festeryler  Stutzman M.D.   On: 03/30/2023 02:45   CT  Head Wo Contrast  Result Date: 03/30/2023 CLINICAL DATA:  Headache, fever.  Cough with occasional hemoptysis. EXAM: CT HEAD WITHOUT CONTRAST TECHNIQUE: Contiguous axial images were obtained from the base of the skull through the vertex without intravenous contrast. RADIATION DOSE REDUCTION: This exam was performed according to the departmental dose-optimization program which includes automated exposure control, adjustment of the mA and/or kV according to patient size and/or use of iterative reconstruction technique. COMPARISON:  08/07/2006. FINDINGS: Brain: No acute intracranial hemorrhage, midline shift or mass effect. No extra-axial fluid collection. Gray-white  matter differentiation is within normal limits. No hydrocephalus. Vascular: No hyperdense vessel or unexpected calcification. Skull: Normal. Negative for fracture or focal lesion. Sinuses/Orbits: No acute finding. Other: None. IMPRESSION: No acute intracranial process. Electronically Signed   By: Thornell Sartorius M.D.   On: 03/30/2023 02:33   DG Chest 2 View  Result Date: 03/29/2023 CLINICAL DATA:  Cough EXAM: CHEST - 2 VIEW COMPARISON:  03/29/2023, CT 01/07/2022, chest x-ray 10/28/2020 FINDINGS: Scattered irregular somewhat nodular foci of opacity within the bilateral lungs. No pleural effusion or pneumothorax. Stable cardiomediastinal silhouette. Biapical scarring IMPRESSION: Scattered irregular somewhat nodular foci of opacity within the bilateral lungs. Findings could be secondary to multifocal infection versus pulmonary nodules, chest CT previously recommended for further assessment. Electronically Signed   By: Jasmine Pang M.D.   On: 03/29/2023 19:31   DG Chest 2 View  Result Date: 03/29/2023 CLINICAL DATA:  R chest pain with cough, hemoptysis, fever and hx of MAI EXAM: CHEST - 2 VIEW COMPARISON:  CT chest 01/07/2022, chest x-ray 10/28/2020 FINDINGS: The heart and mediastinal contours are within normal limits. A right upper lobe peripheral nodular-like airspace opacity measuring approximately 2.5 x 0.8 cm. Persistent interstitial and airspace opacities along the lung apices. No pulmonary edema. No pleural effusion. No pneumothorax. No acute osseous abnormality. IMPRESSION: A right upper lobe peripheral nodular-like airspace opacity measuring approximately 2.5 x 0.8 cm. Persistent interstitial and airspace opacities along the lung apices. Given previously identified similar region smaller right upper lobe pulmonary nodule, recommend CT chest with intravenous contrast for further evaluation. Electronically Signed   By: Tish Frederickson M.D.   On: 03/29/2023 08:43    Procedures Procedures    Medications  Ordered in ED Medications  iohexol (OMNIPAQUE) 300 MG/ML solution 100 mL (100 mLs Intravenous Contrast Given 03/30/23 0211)  levofloxacin (LEVAQUIN) tablet 750 mg (750 mg Oral Given 03/30/23 0354)    ED Course/ Medical Decision Making/ A&P                             Medical Decision Making Amount and/or Complexity of Data Reviewed Labs: ordered. Radiology: ordered. ECG/medicine tests: ordered.  Risk Prescription drug management.   Overall patient appears well.  Concern for possible systemic infection was found to have a urinary tract infection.  Her chest x-ray was abnormal CT scan suggestive so this was done that did show worsening of her likely MAI infection.  She has been told multiple times in the past there is nothing else they can do about it.  I suggested that we do antibiotics to cover that and the urine and have her follow-up with her primary doctor and her pulmonologist just to ensure that this is still the case.  She is not hypoxic or any distress so I don't see any indication for admission at this time.   Final Clinical Impression(s) / ED Diagnoses Final diagnoses:  Acute cystitis without  hematuria  MAI (mycobacterium avium-intracellulare)    Rx / DC Orders ED Discharge Orders          Ordered    levofloxacin (LEVAQUIN) 500 MG tablet  Daily        03/30/23 0348              Zayden Hahne, Barbara Cower, MD 03/30/23 [phone]

## 2023-04-01 LAB — CULTURE, BLOOD (ROUTINE X 2): Special Requests: ADEQUATE

## 2023-04-02 LAB — CULTURE, BLOOD (ROUTINE X 2)

## 2023-04-03 LAB — CULTURE, BLOOD (ROUTINE X 2): Culture: NO GROWTH

## 2023-04-04 LAB — CULTURE, BLOOD (ROUTINE X 2)

## 2023-04-08 DIAGNOSIS — A310 Pulmonary mycobacterial infection: Secondary | ICD-10-CM | POA: Diagnosis not present

## 2023-04-08 DIAGNOSIS — N390 Urinary tract infection, site not specified: Secondary | ICD-10-CM | POA: Diagnosis not present

## 2023-04-14 ENCOUNTER — Ambulatory Visit (HOSPITAL_COMMUNITY)
Admission: RE | Admit: 2023-04-14 | Discharge: 2023-04-14 | Disposition: A | Discharge status: Home / Self Care | Payer: Medicare HMO | Source: Ambulatory Visit | Attending: Internal Medicine | Admitting: Internal Medicine

## 2023-04-14 ENCOUNTER — Encounter (HOSPITAL_COMMUNITY): Payer: Self-pay

## 2023-04-14 DIAGNOSIS — R911 Solitary pulmonary nodule: Secondary | ICD-10-CM

## 2023-04-26 ENCOUNTER — Encounter: Payer: Self-pay | Admitting: Internal Medicine

## 2023-04-26 ENCOUNTER — Ambulatory Visit: Payer: Medicare HMO | Admitting: Internal Medicine

## 2023-04-26 VITALS — BP 128/68 | HR 54 | Temp 97.9°F | Ht 62.0 in | Wt 115.0 lb

## 2023-04-26 DIAGNOSIS — Q676 Pectus excavatum: Secondary | ICD-10-CM

## 2023-04-26 DIAGNOSIS — A310 Pulmonary mycobacterial infection: Secondary | ICD-10-CM

## 2023-04-26 NOTE — Patient Instructions (Signed)
Please schedule follow up scheduled with myself in 6 months.  If my schedule is not open yet, we will contact you with a reminder closer to that time. Please call [phone] if you haven't heard from [place] a month before.   Continue your airway clearance with albuterol and saline nebulizer solution with flutter valve three times a day.  IF you are getting sick and feel worse (fevers, worsening cough, pneumonia) please call our office. If you aren't able to get in to see [place], you can see Dr. Margo Aye. I recommend a week of antibiotics if he thinks it is appropriate.   As we discussed, we are not going to treat the MAI infection in your lungs due to the bad side effects of treatment.   Try a heating pad for your chest wall pain.   Continue gabapentin.

## 2023-04-26 NOTE — Progress Notes (Signed)
Tami Estrada    [id number]    [birth year]  Primary Care Physician:Hall, Kathleene Hazel, MD Date of Appointment: 04/26/2023 Established Patient Visit  Chief complaint:   Chief Complaint  Patient presents with   Follow-up    Pt has been becoming fatigued a lot easier and has had some discomfort on the right side and also pt has been becoming a little more SOB.     HPI: Tami Estrada is a 72 y.o. woman who initially presented to see me in spring 2020 with chest pain and dyspnea. Found to have pectus excavatum and I referred her for Nuss procedure at [facility] which she had in July 2021 Also incidental findings of MAI infection.  Interval Updates: Here for follow up. Was having fevers, chest pain, shortness of breath. Went to the ED and had repeat CT Chest which showed worsening MAI infection.   She is doing albuterol nebulizer with saline three times a day. With flutter valve. She is bringing up brownish mucus, occasional streaks of blood. Everyday she brings up about a spoonfull of mucus.   Still having chest pain on the right side. Improved with gabapentin.   Overall feels tired, short of breath, and worse than she did a couple years ago. No weight loss. No night sweats.    I have reviewed the patient's family social and past medical history and updated as appropriate.   Past Medical History:  Diagnosis Date   Abnormal findings on diagnostic imaging of other specified body structures    Bitten or stung by nonvenomous insect and other nonvenomous arthropods, initial encounter    Chest pain, unspecified    Disappearance and death of family member    GERD (gastroesophageal reflux disease)    Esophogeal dysmotility- 2010   History of Rocky Mountain spotted fever    Hypothyroidism    Other fatigue    Pain in right shoulder    Thyroid disease    Toxic effect of contact with other venomous plant, accidental (unintentional), initial encounter    Unspecified osteoarthritis,  unspecified site     Past Surgical History:  Procedure Laterality Date   BACK SURGERY     lumbar disc   NECK SURGERY     cervical disc   SHOULDER ARTHROSCOPY WITH DISTAL CLAVICLE RESECTION Right 02/22/2015   Procedure: RIGHT SHOULDER ARTHROSCOPY WITH DISTAL CLAVICLE RESECTION, DEBRIDEMENT;  Surgeon: Vickki Hearing, MD;  Location: [facility];  Service: Orthopedics;  Laterality: Right;   SHOULDER OPEN ROTATOR CUFF REPAIR Right 02/22/2015   Procedure: ROTATOR CUFF REPAIR SHOULDER OPEN;  Surgeon: Vickki Hearing, MD;  Location: [facility];  Service: Orthopedics;  Laterality: Right;   THYROID SURGERY     thyroidectomy    Family History  Problem Relation Age of Onset   Diabetes Sister    Cancer Sister    Pulmonary fibrosis Brother    Lung cancer Brother    COPD Daughter     Social History   Occupational History   Not on file  Tobacco Use   Smoking status: Never   Smokeless tobacco: Never  Substance and Sexual Activity   Alcohol use: No   Drug use: No   Sexual activity: Yes    Birth control/protection: Post-menopausal     Physical Exam: Blood pressure 128/68, pulse (!) 54, temperature 97.9 F (36.6 C), temperature source Oral, height 5\' 2"  (1.575 m), weight 115 lb (52.2 kg), SpO2 97 %.  Gen:  No distress Lungs:    pectus excavatum, lungs clear no wheezes or crackles CV:       RRR no mrg   Data Reviewed: Imaging: I have personally reviewed the ct chest April 2024 shows worsening patchy nodule and infectious opacities R>L when compared to Jan 2023 CT Chest  PFTs:      Latest Ref Rng & Units 04/17/2020   11:43 AM  PFT Results  FVC-Pre L 2.51  P  FVC-Predicted Pre % 93  P  FVC-Post L 2.66  P  FVC-Predicted Post % 98  P  Pre FEV1/FVC % % 75  P  Post FEV1/FCV % % 77  P  FEV1-Pre L 1.89  P  FEV1-Predicted Pre % 92  P  FEV1-Post L 2.04  P  DLCO uncorrected ml/min/mmHg 18.44  P  DLCO UNC% % 103  P  DLCO corrected ml/min/mmHg 33.62  P  DLCO COR %Predicted % 188   P  DLVA Predicted % 223  P  TLC L 4.87  P  TLC % Predicted % 104  P  RV % Predicted % 112  P    P Preliminary result   I have personally reviewed the patient's PFTs and they demonstrate restriction to ventilation  Labs: Lab Results  Component Value Date   NA 136 03/29/2023   K 3.6 03/29/2023   CO2 24 03/29/2023   GLUCOSE 99 03/29/2023   BUN 11 03/29/2023   CREATININE 0.75 03/29/2023   CALCIUM 9.4 03/29/2023   EGFR 85 03/29/2023   GFRNONAA >60 03/29/2023   Lab Results  Component Value Date   WBC 9.4 03/29/2023   HGB 14.0 03/29/2023   HCT 41.4 03/29/2023   MCV 92.0 03/29/2023   PLT 258 03/29/2023     Immunization status: Immunization History  Administered Date(s) Administered   Fluad Quad(high Dose 65+) 09/11/2020, 09/10/2021   Influenza Inj Mdck Quad Pf 10/08/2016   Influenza Split 09/13/2012   Influenza, High Dose Seasonal PF 10/18/2018   Influenza,inj,Quad PF,6+ Mos 09/14/2019   Influenza-Unspecified 11/24/2013, 09/03/2014, 10/16/2015, 10/07/2022   Moderna Sars-Covid-2 Vaccination 02/01/2020, 03/01/2020   Pneumococcal Conjugate-13 08/10/2018   Pneumococcal Polysaccharide-23 10/16/2015    Assessment:  Pectus Excavatum s/p Nuss procedure July 2021 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 She has failed a trial of triple abx therapy due to adverse effects. She is not interested in pursuing at this time, despite her worsening pulmonary symptoms.  We discussed risks and benefits of treatment at great length.   If you are getting sick and feel worse (fevers, worsening cough, pneumonia) please call our office. If you aren't able to get in to see [place], you can see Dr. Margo Aye. I recommend a week of antibiotics if he thinks it is appropriate.   We will accept the risk for developing anti-microbial resistance since  she is unlikely to ever desire full eradication treatment.   Continue your airway clearance with albuterol and saline nebulizer solution with flutter valve three times a day.  Try a heating pad for your chest wall pain.   Continue gabapentin.    I spent 32 minutes in the care of this patient today including pre-charting, chart review, review of results, face-to-face care, coordination of care and communication with consultants etc.).  Return to Care: Return in about 6 months (around 10/27/2023).   Durel Salts, MD Pulmonary and Critical Care  Medicine [facility] [phone]

## 2023-05-05 DIAGNOSIS — Q676 Pectus excavatum: Secondary | ICD-10-CM | POA: Diagnosis not present

## 2023-05-05 DIAGNOSIS — A310 Pulmonary mycobacterial infection: Secondary | ICD-10-CM | POA: Diagnosis not present

## 2023-05-05 DIAGNOSIS — E039 Hypothyroidism, unspecified: Secondary | ICD-10-CM | POA: Diagnosis not present

## 2023-05-05 DIAGNOSIS — Z79890 Hormone replacement therapy (postmenopausal): Secondary | ICD-10-CM | POA: Diagnosis not present

## 2023-06-02 DIAGNOSIS — R29898 Other symptoms and signs involving the musculoskeletal system: Secondary | ICD-10-CM | POA: Diagnosis not present

## 2023-06-02 DIAGNOSIS — R531 Weakness: Secondary | ICD-10-CM | POA: Diagnosis not present

## 2023-06-08 ENCOUNTER — Ambulatory Visit (HOSPITAL_COMMUNITY)
Admission: RE | Admit: 2023-06-08 | Discharge: 2023-06-08 | Disposition: A | Discharge status: Home / Self Care | Payer: Medicare HMO | Source: Ambulatory Visit | Attending: Internal Medicine | Admitting: Internal Medicine

## 2023-06-08 ENCOUNTER — Other Ambulatory Visit (HOSPITAL_COMMUNITY): Payer: Self-pay | Admitting: Internal Medicine

## 2023-06-08 DIAGNOSIS — R29898 Other symptoms and signs involving the musculoskeletal system: Secondary | ICD-10-CM

## 2023-06-08 DIAGNOSIS — R200 Anesthesia of skin: Secondary | ICD-10-CM | POA: Diagnosis not present

## 2023-06-15 ENCOUNTER — Encounter: Payer: Self-pay | Admitting: Student

## 2023-06-15 ENCOUNTER — Telehealth: Payer: Self-pay | Admitting: Internal Medicine

## 2023-06-15 ENCOUNTER — Ambulatory Visit: Payer: Medicare HMO | Admitting: Student

## 2023-06-15 VITALS — BP 122/72 | HR 53 | Temp 97.7°F | Ht 62.0 in | Wt 115.4 lb

## 2023-06-15 DIAGNOSIS — J471 Bronchiectasis with (acute) exacerbation: Secondary | ICD-10-CM | POA: Diagnosis not present

## 2023-06-15 MED ORDER — AMOXICILLIN-POT CLAVULANATE 875-125 MG PO TABS: 1.0000 | ORAL_TABLET | Freq: Two times a day (BID) | ORAL | 0 refills | Status: DC

## 2023-06-15 NOTE — Patient Instructions (Signed)
-   sputum samples - albuterol 2 puffs twice daily FOLLOWED BY flutter valve 10 slow but firm puffs twice daily - mucinex [phone] mg twice daily - augmentin 1 tablet twice daily for two weeks - when you have bloody cough do not use your hypertonic saline, can resume it when bloody cough subsides - CT Chest you'll be called to schedule

## 2023-06-15 NOTE — Telephone Encounter (Signed)
Called and spoke with patient's daughter Malachi Bonds. She stated that the patient has been coughing up bright red blood for months but has noticed the amount has increased over the past few days. Most of the time the blood is mixed in with phlegm. Denied any fever, chills or chest pain. By the time I called the patient, she had been scheduled for an acute appt with Dr. Thora Lance this afternoon at 245pm. I advised Malachi Bonds to keep his appt and she verbalized understanding.   Nothing further needed at time of call.

## 2023-06-15 NOTE — Progress Notes (Signed)
Synopsis: Referred for hemoptysis by Benita Stabile, MD  Subjective:   PATIENT ID: Tami Estrada GENDER: female DOB: 11-29-1951, MRN: [id number]  Chief Complaint  Patient presents with   Acute Visit    Pt states she is coughing up blood, bright and has been occurring the last couple of days. Persistent cough worse then baseline.    72yF with history of MAI infection, pectus, GERD  Failed to tolerate RAE TIW for MAI - stopped after only a few weeks. Just generally felt profoundly unwell after starting this therapy - hard for her to get out of bed.   Last CT Chest 03/29/23 with increased size of focal nodular consolidation RUL 1.6cm, likely increased burden of MAI related disease overall.   She has not had fever. No bloody nose. No nausea/vomiting. Has had some pain under right breast. Along with her hemoptysis she is coughing up a little more phlegm than usual.   Otherwise pertinent review of systems is negative.  Past Medical History:  Diagnosis Date   Abnormal findings on diagnostic imaging of other specified body structures    Bitten or stung by nonvenomous insect and other nonvenomous arthropods, initial encounter    Chest pain, unspecified    Disappearance and death of family member    GERD (gastroesophageal reflux disease)    Esophogeal dysmotility- 2010   History of Rocky Mountain spotted fever    Hypothyroidism    Other fatigue    Pain in right shoulder    Thyroid disease    Toxic effect of contact with other venomous plant, accidental (unintentional), initial encounter    Unspecified osteoarthritis, unspecified site      Family History  Problem Relation Age of Onset   Diabetes Sister    Cancer Sister    Pulmonary fibrosis Brother    Lung cancer Brother    COPD Daughter      Past Surgical History:  Procedure Laterality Date   BACK SURGERY     lumbar disc   NECK SURGERY     cervical disc   SHOULDER ARTHROSCOPY WITH DISTAL CLAVICLE RESECTION Right 02/22/2015    Procedure: RIGHT SHOULDER ARTHROSCOPY WITH DISTAL CLAVICLE RESECTION, DEBRIDEMENT;  Surgeon: Vickki Hearing, MD;  Location: [facility];  Service: Orthopedics;  Laterality: Right;   SHOULDER OPEN ROTATOR CUFF REPAIR Right 02/22/2015   Procedure: ROTATOR CUFF REPAIR SHOULDER OPEN;  Surgeon: Vickki Hearing, MD;  Location: [facility];  Service: Orthopedics;  Laterality: Right;   THYROID SURGERY     thyroidectomy    Social History   Socioeconomic History   Marital status: Widowed    Spouse name: Not on file   Number of children: Not on file   Years of education: Not on file   Highest education level: Not on file  Occupational History   Not on file  Tobacco Use   Smoking status: Never   Smokeless tobacco: Never  Substance and Sexual Activity   Alcohol use: No   Drug use: No   Sexual activity: Yes    Birth control/protection: Post-menopausal  Other Topics Concern   Not on file  Social History Narrative   Not on file   Social Determinants of Health   Financial Resource Strain: Not on file  Food Insecurity: Not on file  Transportation Needs: Not on file  Physical Activity: Not on file  Stress: Not on file  Social Connections: Not on file  Intimate Partner Violence: Not on file     Allergies  Allergen Reactions   Hydrocodone     Nausea/vomit     Outpatient Medications Prior to Visit  Medication Sig Dispense Refill   albuterol (PROVENTIL) (2.5 MG/3ML) 0.083% nebulizer solution Take 3 mLs (2.5 mg total) by nebulization every 6 (six) hours as needed for wheezing or shortness of breath. 75 mL 5   albuterol (VENTOLIN HFA) 108 (90 Base) MCG/ACT inhaler TAKE 2 PUFFS BY MOUTH EVERY 6 HOURS AS NEEDED FOR WHEEZE OR SHORTNESS OF BREATH 18 each 5   gabapentin (NEURONTIN) 300 MG capsule Take 1 capsule (300 mg total) by mouth 3 (three) times daily. 90 capsule 5   ibuprofen (ADVIL) 800 MG tablet Take 800 mg by mouth 3 (three) times daily.     Respiratory Therapy Supplies (FLUTTER) DEVI 1  Device by Does not apply route as needed. 1 each 0   sodium chloride 0.9 % nebulizer solution INHALE THE CONTENTS OF 1 VIAL BY NEBULIZATION IN THE MORNING AND AT BEDTIME. 300 mL 6   No facility-administered medications prior to visit.       Objective:   Physical Exam:  General appearance: 72 y.o., female, NAD, conversant  Eyes: anicteric sclerae; PERRL, tracking appropriately HENT: NCAT; MMM Neck: Trachea midline; no lymphadenopathy, no JVD Lungs: CTAB, no crackles, no wheeze, with normal respiratory effort CV: RRR, no murmur  Abdomen: Soft, non-tender; non-distended, BS present  Extremities: No peripheral edema, warm Skin: Normal turgor and texture; no rash Psych: Appropriate affect Neuro: Alert and oriented to person and place, no focal deficit     Vitals:   06/15/23 1452  BP: 122/72  Pulse: (!) 53  Temp: 97.7 F (36.5 C)  TempSrc: Oral  SpO2: 98%  Weight: 115 lb 6.4 oz (52.3 kg)  Height: 5\' 2"  (1.575 m)   98% on RA BMI Readings from Last 3 Encounters:  06/15/23 21.11 kg/m  04/26/23 21.03 kg/m  03/29/23 21.03 kg/m   Wt Readings from Last 3 Encounters:  06/15/23 115 lb 6.4 oz (52.3 kg)  04/26/23 115 lb (52.2 kg)  03/29/23 115 lb (52.2 kg)     CBC    Component Value Date/Time   WBC 9.4 03/29/2023 2343   RBC 4.50 03/29/2023 2343   HGB 14.0 03/29/2023 2343   HGB 13.8 03/29/2023 0909   HCT 41.4 03/29/2023 2343   HCT 41.3 03/29/2023 0909   PLT 258 03/29/2023 2343   PLT 252 03/29/2023 0909   MCV 92.0 03/29/2023 2343   MCV 93 03/29/2023 0909   MCH 31.1 03/29/2023 2343   MCHC 33.8 03/29/2023 2343   RDW 12.6 03/29/2023 2343   RDW 12.7 03/29/2023 0909   LYMPHSABS 1.8 03/29/2023 2343   LYMPHSABS 1.5 03/29/2023 0909   MONOABS 0.8 03/29/2023 2343   EOSABS 0.2 03/29/2023 2343   EOSABS 0.1 03/29/2023 0909   BASOSABS 0.1 03/29/2023 2343   BASOSABS 0.1 03/29/2023 0909    Chest Imaging: CT Chest 03/30/23 reviewed with nodular bronchiectasis, dominant  1.6cm nodular consolidation RUL, cavitary nodule LUL  Pulmonary Functions Testing Results:    Latest Ref Rng & Units 04/17/2020   11:43 AM  PFT Results  FVC-Pre L 2.51  P  FVC-Predicted Pre % 93  P  FVC-Post L 2.66  P  FVC-Predicted Post % 98  P  Pre FEV1/FVC % % 75  P  Post FEV1/FCV % % 77  P  FEV1-Pre L 1.89  P  FEV1-Predicted Pre % 92  P  FEV1-Post L 2.04  P  DLCO uncorrected ml/min/mmHg 18.44  P  DLCO UNC% % 103  P  DLCO corrected ml/min/mmHg 33.62  P  DLCO COR %Predicted % 188  P  DLVA Predicted % 223  P  TLC L 4.87  P  TLC % Predicted % 104  P  RV % Predicted % 112  P    P Preliminary result       Assessment & Plan:   # Hemoptysis # Bronchiectasis in exacerbation # History of MAI infection intolerant of RAE   Plan: - sputum samples - albuterol 2 puffs twice daily FOLLOWED BY flutter valve 10 slow but firm puffs twice daily - mucinex [phone] mg twice daily - augmentin 1 tablet twice daily for two weeks - when you have bloody cough do not use your hypertonic saline, can resume it when bloody cough subsides - CT Chest you'll be called to schedule     Omar Person, MD [facility] 06/15/2023 4:51 PM

## 2023-06-15 NOTE — Telephone Encounter (Signed)
Pt. Messaged through the my chart and she is cough up blood and need med advise

## 2023-06-16 ENCOUNTER — Other Ambulatory Visit (HOSPITAL_COMMUNITY)
Admission: RE | Admit: 2023-06-16 | Discharge: 2023-06-16 | Disposition: A | Discharge status: Home / Self Care | Payer: Medicare HMO | Source: Ambulatory Visit | Attending: Student | Admitting: Student

## 2023-06-16 DIAGNOSIS — J471 Bronchiectasis with (acute) exacerbation: Secondary | ICD-10-CM | POA: Insufficient documentation

## 2023-06-16 LAB — CULTURE, RESPIRATORY W GRAM STAIN

## 2023-06-17 LAB — CULTURE, RESPIRATORY W GRAM STAIN

## 2023-06-18 LAB — ACID FAST SMEAR (AFB, MYCOBACTERIA): Acid Fast Smear: NEGATIVE

## 2023-06-21 ENCOUNTER — Other Ambulatory Visit (HOSPITAL_COMMUNITY)
Admission: RE | Admit: 2023-06-21 | Discharge: 2023-06-21 | Disposition: A | Discharge status: Home / Self Care | Payer: Medicare HMO | Source: Ambulatory Visit | Attending: Student | Admitting: Student

## 2023-06-21 DIAGNOSIS — J471 Bronchiectasis with (acute) exacerbation: Secondary | ICD-10-CM | POA: Diagnosis not present

## 2023-06-21 LAB — CULTURE, RESPIRATORY W GRAM STAIN

## 2023-06-22 ENCOUNTER — Encounter: Payer: Self-pay | Admitting: Internal Medicine

## 2023-06-22 LAB — CULTURE, RESPIRATORY W GRAM STAIN

## 2023-06-23 LAB — ACID FAST SMEAR (AFB, MYCOBACTERIA): Acid Fast Smear: NEGATIVE

## 2023-06-23 LAB — CULTURE, RESPIRATORY W GRAM STAIN: Culture: NORMAL

## 2023-06-23 MED ORDER — FLUCONAZOLE 150 MG PO TABS: 150.0000 mg | ORAL_TABLET | Freq: Once | ORAL | 0 refills | Status: AC

## 2023-06-29 ENCOUNTER — Ambulatory Visit (HOSPITAL_COMMUNITY): Admission: RE | Admit: 2023-06-29 | Payer: Medicare HMO | Source: Ambulatory Visit

## 2023-07-06 DIAGNOSIS — Z961 Presence of intraocular lens: Secondary | ICD-10-CM | POA: Diagnosis not present

## 2023-07-06 DIAGNOSIS — H26493 Other secondary cataract, bilateral: Secondary | ICD-10-CM | POA: Diagnosis not present

## 2023-07-06 DIAGNOSIS — H353131 Nonexudative age-related macular degeneration, bilateral, early dry stage: Secondary | ICD-10-CM | POA: Diagnosis not present

## 2023-07-21 LAB — MAC SUSCEPTIBILITY BROTH
Ciprofloxacin: >8
Clarithromycin: 2
Doxycycline: >8
Linezolid: 32
Minocycline: >8
Moxifloxacin: >4
Rifabutin: 0.12
Rifampin: 4
Streptomycin: >32

## 2023-07-21 LAB — ACID FAST ID BY PCR AND SUSCEPTIBILITIES
M Tuberculosis Complex: NEGATIVE
M avium complex: POSITIVE — AB

## 2023-07-21 LAB — ACID FAST CULTURE WITH REFLEXED SENSITIVITIES (MYCOBACTERIA): Acid Fast Culture: POSITIVE — AB

## 2023-07-25 ENCOUNTER — Other Ambulatory Visit: Payer: Self-pay | Admitting: Internal Medicine

## 2023-07-27 DIAGNOSIS — L739 Follicular disorder, unspecified: Secondary | ICD-10-CM | POA: Diagnosis not present

## 2023-07-27 DIAGNOSIS — B958 Unspecified staphylococcus as the cause of diseases classified elsewhere: Secondary | ICD-10-CM | POA: Diagnosis not present

## 2023-07-27 DIAGNOSIS — B999 Unspecified infectious disease: Secondary | ICD-10-CM | POA: Diagnosis not present

## 2023-07-27 DIAGNOSIS — L042 Acute lymphadenitis of upper limb: Secondary | ICD-10-CM | POA: Diagnosis not present

## 2023-07-28 DIAGNOSIS — M1990 Unspecified osteoarthritis, unspecified site: Secondary | ICD-10-CM | POA: Diagnosis not present

## 2023-07-28 DIAGNOSIS — Z809 Family history of malignant neoplasm, unspecified: Secondary | ICD-10-CM | POA: Diagnosis not present

## 2023-07-28 DIAGNOSIS — Z833 Family history of diabetes mellitus: Secondary | ICD-10-CM | POA: Diagnosis not present

## 2023-07-28 DIAGNOSIS — Z885 Allergy status to narcotic agent status: Secondary | ICD-10-CM | POA: Diagnosis not present

## 2023-07-28 DIAGNOSIS — G629 Polyneuropathy, unspecified: Secondary | ICD-10-CM | POA: Diagnosis not present

## 2023-07-28 DIAGNOSIS — R030 Elevated blood-pressure reading, without diagnosis of hypertension: Secondary | ICD-10-CM | POA: Diagnosis not present

## 2023-07-28 DIAGNOSIS — Z8701 Personal history of pneumonia (recurrent): Secondary | ICD-10-CM | POA: Diagnosis not present

## 2023-07-28 DIAGNOSIS — Z823 Family history of stroke: Secondary | ICD-10-CM | POA: Diagnosis not present

## 2023-07-28 DIAGNOSIS — Z8249 Family history of ischemic heart disease and other diseases of the circulatory system: Secondary | ICD-10-CM | POA: Diagnosis not present

## 2023-07-28 DIAGNOSIS — E039 Hypothyroidism, unspecified: Secondary | ICD-10-CM | POA: Diagnosis not present

## 2023-07-28 DIAGNOSIS — J449 Chronic obstructive pulmonary disease, unspecified: Secondary | ICD-10-CM | POA: Diagnosis not present

## 2023-08-02 ENCOUNTER — Encounter: Payer: Self-pay | Admitting: Internal Medicine

## 2023-08-02 ENCOUNTER — Ambulatory Visit: Payer: Medicare HMO | Admitting: Internal Medicine

## 2023-08-02 VITALS — BP 116/60 | HR 98 | Temp 98.1°F | Ht 62.0 in | Wt 117.0 lb

## 2023-08-02 DIAGNOSIS — J479 Bronchiectasis, uncomplicated: Secondary | ICD-10-CM | POA: Diagnosis not present

## 2023-08-02 DIAGNOSIS — A310 Pulmonary mycobacterial infection: Secondary | ICD-10-CM | POA: Diagnosis not present

## 2023-08-02 NOTE — Progress Notes (Signed)
Tami Estrada    [id number]    07-06-1951  Primary Care Physician:Hall, Kathleene Hazel, MD Date of Appointment: 08/02/2023 Established Patient Visit  Chief complaint:   Chief Complaint  Patient presents with   Follow-up    Fatigue with minimal exertion.  On antibiotic for infection under arm since last week (1 more day).  Has not gotten better.  Still painful.  Has gotten bigger.  It was red, but is no longer red.     HPI: Tami Estrada is a 72 y.o. woman who initially presented to see me in spring 2020 with chest pain and dyspnea. Found to have pectus excavatum and I referred her for Nuss procedure at [facility] which she had in July 2021 Also incidental findings of MAI infection. Has chronic MAI and has not been successful with treatment attempts in the past.   Interval Updates: Here for follow up for chronic MAI infection, not on treatmetn.   Has been having fatigue. No fevers. Weight is stable - she has gained weight.  Ding albuterol nebs with saline three times daily with flutter valve. No mucus is coming up.    I have reviewed the patient's family social and past medical history and updated as appropriate.   Past Medical History:  Diagnosis Date   Abnormal findings on diagnostic imaging of other specified body structures    Bitten or stung by nonvenomous insect and other nonvenomous arthropods, initial encounter    Chest pain, unspecified    Disappearance and death of family member    GERD (gastroesophageal reflux disease)    Esophogeal dysmotility- 2010   History of Rocky Mountain spotted fever    Hypothyroidism    Other fatigue    Pain in right shoulder    Thyroid disease    Toxic effect of contact with other venomous plant, accidental (unintentional), initial encounter    Unspecified osteoarthritis, unspecified site     Past Surgical History:  Procedure Laterality Date   BACK SURGERY     lumbar disc   NECK SURGERY     cervical disc   SHOULDER ARTHROSCOPY WITH  DISTAL CLAVICLE RESECTION Right 02/22/2015   Procedure: RIGHT SHOULDER ARTHROSCOPY WITH DISTAL CLAVICLE RESECTION, DEBRIDEMENT;  Surgeon: Vickki Hearing, MD;  Location: [facility];  Service: Orthopedics;  Laterality: Right;   SHOULDER OPEN ROTATOR CUFF REPAIR Right 02/22/2015   Procedure: ROTATOR CUFF REPAIR SHOULDER OPEN;  Surgeon: Vickki Hearing, MD;  Location: [facility];  Service: Orthopedics;  Laterality: Right;   THYROID SURGERY     thyroidectomy    Family History  Problem Relation Age of Onset   Diabetes Sister    Cancer Sister    Pulmonary fibrosis Brother    Lung cancer Brother    COPD Daughter     Social History   Occupational History   Not on file  Tobacco Use   Smoking status: Never   Smokeless tobacco: Never  Substance and Sexual Activity   Alcohol use: No   Drug use: No   Sexual activity: Yes    Birth control/protection: Post-menopausal     Physical Exam: Blood pressure 116/60, pulse 98, temperature 98.1 F (36.7 C), temperature source Oral, height 5\' 2"  (1.575 m), weight 117 lb (53.1 kg), SpO2 99%.  Gen:      No distress, thin Lungs:    pectus excavatum, lungs clear no wheezes or crackles CV:       RRR no mrg MSK:  tender cutaneous nodules in right cubital fossa. Mobile, no fluctuance.     Data Reviewed: Imaging: I have personally reviewed the ct chest April 2024 shows worsening patchy nodule and infectious opacities R>L when compared to Jan 2023 CT Chest  PFTs:      Latest Ref Rng & Units 04/17/2020   11:43 AM  PFT Results  FVC-Pre L 2.51  P  FVC-Predicted Pre % 93  P  FVC-Post L 2.66  P  FVC-Predicted Post % 98  P  Pre FEV1/FVC % % 75  P  Post FEV1/FCV % % 77  P  FEV1-Pre L 1.89  P  FEV1-Predicted Pre % 92  P  FEV1-Post L 2.04  P  DLCO uncorrected ml/min/mmHg 18.44  P  DLCO UNC% % 103  P  DLCO corrected ml/min/mmHg 33.62  P  DLCO COR %Predicted % 188  P  DLVA Predicted % 223  P  TLC L 4.87  P  TLC % Predicted % 104  P  RV % Predicted %  112  P    P Preliminary result   I have personally reviewed the patient's PFTs and they demonstrate restriction to ventilation  Labs: Lab Results  Component Value Date   NA 136 03/29/2023   K 3.6 03/29/2023   CO2 24 03/29/2023   GLUCOSE 99 03/29/2023   BUN 11 03/29/2023   CREATININE 0.75 03/29/2023   CALCIUM 9.4 03/29/2023   EGFR 85 03/29/2023   GFRNONAA >60 03/29/2023   Lab Results  Component Value Date   WBC 9.4 03/29/2023   HGB 14.0 03/29/2023   HCT 41.4 03/29/2023   MCV 92.0 03/29/2023   PLT 258 03/29/2023     Immunization status: Immunization History  Administered Date(s) Administered   Fluad Quad(high Dose 65+) 09/11/2020, 09/10/2021   Influenza Inj Mdck Quad Pf 10/08/2016   Influenza Split 09/13/2012   Influenza, High Dose Seasonal PF 10/18/2018   Influenza,inj,Quad PF,6+ Mos 09/14/2019   Influenza-Unspecified 11/24/2013, 09/03/2014, 10/16/2015, 10/07/2022   Moderna Sars-Covid-2 Vaccination 02/01/2020, 03/01/2020   Pneumococcal Conjugate-13 08/10/2018   Pneumococcal Polysaccharide-23 10/16/2015    Assessment:  Chronic progressive MAI infection Pectus Excavatum s/p Nuss procedure July 2021   Plan/Recommendations: We discussed her MAI infection progression at length. She has failed a trial of triple abx therapy due to adverse effects. She is not interested in pursuing at this time, despite her worsening pulmonary symptoms.  We discussed risks and benefits of treatment at great length.   I will call you with the results of your CT Scan of the Chest next week.  Continue the airway clearance with albuterol nebs and saline nebs three times daily with flutter valve.  Follow up with dermatologist about rash under your underarm. Cutaneous MAI infection would be a very rare possibility given she has a competent immune system.  Call me sooner if issues or concerns with your breathing.   We discussed that given her treatment goals, we will accept the risk for  developing anti-microbial resistance since she is unlikely to ever desire full eradication treatment. (Should require prn antibiotics.)  I will order a Percussive Chest vest to help mobilize the secretions and help you cough up mucus.   Return to Care: Return in about 6 months (around 02/02/2024).   Durel Salts, MD Pulmonary and Critical Care Medicine [facility] [phone]

## 2023-08-02 NOTE — Patient Instructions (Addendum)
Please schedule follow up scheduled with myself in 6 months.  If my schedule is not open yet, we will contact you with a reminder closer to that time. Please call [phone] if you haven't heard from [place] a month before.    I will call you with the results of your CT Scan of the Chest next week.  Continue the airway clearance with albuterol nebs and saline nebs three times daily with flutter valve.  I will order a Percussive Chest vest to help mobilize the secretions and help you cough up mucus.  Follow up with dermatologist about rash under your underarm.  Call me sooner if issues or concerns with your breathing.

## 2023-08-10 ENCOUNTER — Ambulatory Visit (HOSPITAL_COMMUNITY)
Admission: RE | Admit: 2023-08-10 | Discharge: 2023-08-10 | Disposition: A | Discharge status: Home / Self Care | Payer: Medicare HMO | Source: Ambulatory Visit | Attending: Student | Admitting: Student

## 2023-08-10 DIAGNOSIS — R590 Localized enlarged lymph nodes: Secondary | ICD-10-CM | POA: Diagnosis not present

## 2023-08-10 DIAGNOSIS — J479 Bronchiectasis, uncomplicated: Secondary | ICD-10-CM | POA: Diagnosis not present

## 2023-08-10 DIAGNOSIS — I700 Atherosclerosis of aorta: Secondary | ICD-10-CM | POA: Diagnosis not present

## 2023-08-10 DIAGNOSIS — J471 Bronchiectasis with (acute) exacerbation: Secondary | ICD-10-CM | POA: Diagnosis not present

## 2023-08-10 DIAGNOSIS — R918 Other nonspecific abnormal finding of lung field: Secondary | ICD-10-CM | POA: Diagnosis not present

## 2023-08-16 ENCOUNTER — Encounter: Payer: Self-pay | Admitting: Internal Medicine

## 2023-08-16 ENCOUNTER — Ambulatory Visit: Payer: Medicare HMO | Admitting: Internal Medicine

## 2023-08-24 ENCOUNTER — Encounter: Payer: Self-pay | Admitting: Internal Medicine

## 2023-08-31 NOTE — Telephone Encounter (Signed)
Patient says vest was not approved - can we follow up with what happened? I never received information that it was denied, or why.

## 2023-08-31 NOTE — Telephone Encounter (Signed)
I have tried to contact [employer] and had to leave a message asking them to call me about this issue

## 2023-09-01 NOTE — Telephone Encounter (Signed)
I spoke with Delice Bison with [employer] she explained to me there is a hold on this product right now. They told the patient to call her doctor so the doctor could choose what alternative treatment to order

## 2023-09-09 DIAGNOSIS — X32XXXD Exposure to sunlight, subsequent encounter: Secondary | ICD-10-CM | POA: Diagnosis not present

## 2023-09-09 DIAGNOSIS — L570 Actinic keratosis: Secondary | ICD-10-CM | POA: Diagnosis not present

## 2023-09-17 DIAGNOSIS — E039 Hypothyroidism, unspecified: Secondary | ICD-10-CM | POA: Diagnosis not present

## 2023-09-17 DIAGNOSIS — E782 Mixed hyperlipidemia: Secondary | ICD-10-CM | POA: Diagnosis not present

## 2023-09-23 DIAGNOSIS — J309 Allergic rhinitis, unspecified: Secondary | ICD-10-CM | POA: Diagnosis not present

## 2023-09-23 DIAGNOSIS — Z7182 Exercise counseling: Secondary | ICD-10-CM | POA: Diagnosis not present

## 2023-09-23 DIAGNOSIS — M79601 Pain in right arm: Secondary | ICD-10-CM | POA: Diagnosis not present

## 2023-09-23 DIAGNOSIS — R29898 Other symptoms and signs involving the musculoskeletal system: Secondary | ICD-10-CM | POA: Diagnosis not present

## 2023-09-23 DIAGNOSIS — Z23 Encounter for immunization: Secondary | ICD-10-CM | POA: Diagnosis not present

## 2023-09-23 DIAGNOSIS — E782 Mixed hyperlipidemia: Secondary | ICD-10-CM | POA: Diagnosis not present

## 2023-09-23 DIAGNOSIS — I700 Atherosclerosis of aorta: Secondary | ICD-10-CM | POA: Diagnosis not present

## 2023-09-23 DIAGNOSIS — R030 Elevated blood-pressure reading, without diagnosis of hypertension: Secondary | ICD-10-CM | POA: Diagnosis not present

## 2023-09-23 DIAGNOSIS — A310 Pulmonary mycobacterial infection: Secondary | ICD-10-CM | POA: Diagnosis not present

## 2023-09-23 DIAGNOSIS — Z713 Dietary counseling and surveillance: Secondary | ICD-10-CM | POA: Diagnosis not present

## 2023-09-23 DIAGNOSIS — E039 Hypothyroidism, unspecified: Secondary | ICD-10-CM | POA: Diagnosis not present

## 2023-09-23 DIAGNOSIS — Q676 Pectus excavatum: Secondary | ICD-10-CM | POA: Diagnosis not present

## 2023-10-07 ENCOUNTER — Encounter: Payer: Self-pay | Admitting: Internal Medicine

## 2023-10-07 MED ORDER — AMOXICILLIN-POT CLAVULANATE 875-125 MG PO TABS: 1.0000 | ORAL_TABLET | Freq: Two times a day (BID) | ORAL | 0 refills | Status: DC

## 2023-10-07 NOTE — Telephone Encounter (Signed)
I called and spoke with the pt  I explained to her that my chart is not intended for acute messages, and next time she has acute need she can call the office or seek emergent care   She states that yesterday she felt tired all day and towards the evening she developed a fever of 105.0  She checked her temp this morning and it was 98.8 and then a couple of hours ago it's back up to 101 She has not had any increased SOB, increased cough or wheezing  When she does cough her sputum is minimal and beige  She states that she has  HA today and she just took some ibuprofen  She has covid tests at home but says that due to the fact she had these same symptoms feb 2025 and it was not covid, she does not think this is covid  Unfortunately there are not any openings in clinic  She states she has not called her PCP and wants advise from Dr Celine Mans  Please advise, thanks!  Allergies  Allergen Reactions   Hydrocodone     Nausea/vomit

## 2023-10-07 NOTE — Telephone Encounter (Signed)
Augmentin sent to her pharmcy. She needs to take 7 days of abx and due her airway clearance regimen.

## 2023-10-21 DIAGNOSIS — Z713 Dietary counseling and surveillance: Secondary | ICD-10-CM | POA: Diagnosis not present

## 2023-10-21 DIAGNOSIS — A310 Pulmonary mycobacterial infection: Secondary | ICD-10-CM | POA: Diagnosis not present

## 2023-10-21 DIAGNOSIS — Z79890 Hormone replacement therapy (postmenopausal): Secondary | ICD-10-CM | POA: Diagnosis not present

## 2023-10-21 DIAGNOSIS — Z7182 Exercise counseling: Secondary | ICD-10-CM | POA: Diagnosis not present

## 2023-10-21 DIAGNOSIS — R030 Elevated blood-pressure reading, without diagnosis of hypertension: Secondary | ICD-10-CM | POA: Diagnosis not present

## 2023-10-21 DIAGNOSIS — Z6822 Body mass index (BMI) 22.0-22.9, adult: Secondary | ICD-10-CM | POA: Diagnosis not present

## 2023-10-21 DIAGNOSIS — E039 Hypothyroidism, unspecified: Secondary | ICD-10-CM | POA: Diagnosis not present

## 2023-10-23 DIAGNOSIS — A310 Pulmonary mycobacterial infection: Secondary | ICD-10-CM | POA: Diagnosis not present

## 2023-10-23 DIAGNOSIS — J479 Bronchiectasis, uncomplicated: Secondary | ICD-10-CM | POA: Diagnosis not present

## 2023-11-22 DIAGNOSIS — J479 Bronchiectasis, uncomplicated: Secondary | ICD-10-CM | POA: Diagnosis not present

## 2023-11-22 DIAGNOSIS — A310 Pulmonary mycobacterial infection: Secondary | ICD-10-CM | POA: Diagnosis not present

## 2023-12-23 DIAGNOSIS — J479 Bronchiectasis, uncomplicated: Secondary | ICD-10-CM | POA: Diagnosis not present

## 2023-12-23 DIAGNOSIS — A310 Pulmonary mycobacterial infection: Secondary | ICD-10-CM | POA: Diagnosis not present

## 2024-01-23 DIAGNOSIS — A310 Pulmonary mycobacterial infection: Secondary | ICD-10-CM | POA: Diagnosis not present

## 2024-01-23 DIAGNOSIS — J479 Bronchiectasis, uncomplicated: Secondary | ICD-10-CM | POA: Diagnosis not present

## 2024-01-31 ENCOUNTER — Other Ambulatory Visit: Payer: Self-pay | Admitting: Internal Medicine

## 2024-02-08 DIAGNOSIS — H3530 Unspecified macular degeneration: Secondary | ICD-10-CM | POA: Diagnosis not present

## 2024-02-08 DIAGNOSIS — I700 Atherosclerosis of aorta: Secondary | ICD-10-CM | POA: Diagnosis not present

## 2024-02-08 DIAGNOSIS — I129 Hypertensive chronic kidney disease with stage 1 through stage 4 chronic kidney disease, or unspecified chronic kidney disease: Secondary | ICD-10-CM | POA: Diagnosis not present

## 2024-02-08 DIAGNOSIS — N182 Chronic kidney disease, stage 2 (mild): Secondary | ICD-10-CM | POA: Diagnosis not present

## 2024-02-08 DIAGNOSIS — Z79890 Hormone replacement therapy (postmenopausal): Secondary | ICD-10-CM | POA: Diagnosis not present

## 2024-02-08 DIAGNOSIS — E785 Hyperlipidemia, unspecified: Secondary | ICD-10-CM | POA: Diagnosis not present

## 2024-02-08 DIAGNOSIS — Z833 Family history of diabetes mellitus: Secondary | ICD-10-CM | POA: Diagnosis not present

## 2024-02-08 DIAGNOSIS — G629 Polyneuropathy, unspecified: Secondary | ICD-10-CM | POA: Diagnosis not present

## 2024-02-08 DIAGNOSIS — Z809 Family history of malignant neoplasm, unspecified: Secondary | ICD-10-CM | POA: Diagnosis not present

## 2024-02-08 DIAGNOSIS — Z8249 Family history of ischemic heart disease and other diseases of the circulatory system: Secondary | ICD-10-CM | POA: Diagnosis not present

## 2024-02-08 DIAGNOSIS — Z823 Family history of stroke: Secondary | ICD-10-CM | POA: Diagnosis not present

## 2024-02-08 DIAGNOSIS — Z008 Encounter for other general examination: Secondary | ICD-10-CM | POA: Diagnosis not present

## 2024-02-08 DIAGNOSIS — J479 Bronchiectasis, uncomplicated: Secondary | ICD-10-CM | POA: Diagnosis not present

## 2024-02-20 DIAGNOSIS — J479 Bronchiectasis, uncomplicated: Secondary | ICD-10-CM | POA: Diagnosis not present

## 2024-02-20 DIAGNOSIS — A310 Pulmonary mycobacterial infection: Secondary | ICD-10-CM | POA: Diagnosis not present

## 2024-03-09 ENCOUNTER — Other Ambulatory Visit: Payer: Self-pay | Admitting: Internal Medicine

## 2024-03-10 NOTE — Telephone Encounter (Signed)
 Please advise if appropriate to continue dont see any instructions to continue in recent office visit summary

## 2024-03-15 ENCOUNTER — Other Ambulatory Visit: Payer: Self-pay | Admitting: Internal Medicine

## 2024-03-15 MED ORDER — GABAPENTIN 300 MG PO CAPS: 300.0000 mg | ORAL_CAPSULE | Freq: Three times a day (TID) | ORAL | 0 refills | Status: DC

## 2024-03-16 DIAGNOSIS — E039 Hypothyroidism, unspecified: Secondary | ICD-10-CM | POA: Diagnosis not present

## 2024-03-16 DIAGNOSIS — E782 Mixed hyperlipidemia: Secondary | ICD-10-CM | POA: Diagnosis not present

## 2024-03-17 LAB — LAB REPORT - SCANNED
EGFR: 66
Free T4: 1.51 ng/dL
TSH: 2.21 (ref 0.41–5.90)

## 2024-03-22 DIAGNOSIS — A310 Pulmonary mycobacterial infection: Secondary | ICD-10-CM | POA: Diagnosis not present

## 2024-03-22 DIAGNOSIS — J479 Bronchiectasis, uncomplicated: Secondary | ICD-10-CM | POA: Diagnosis not present

## 2024-03-23 DIAGNOSIS — M79601 Pain in right arm: Secondary | ICD-10-CM | POA: Diagnosis not present

## 2024-03-23 DIAGNOSIS — R030 Elevated blood-pressure reading, without diagnosis of hypertension: Secondary | ICD-10-CM | POA: Diagnosis not present

## 2024-03-23 DIAGNOSIS — A310 Pulmonary mycobacterial infection: Secondary | ICD-10-CM | POA: Diagnosis not present

## 2024-03-23 DIAGNOSIS — J309 Allergic rhinitis, unspecified: Secondary | ICD-10-CM | POA: Diagnosis not present

## 2024-03-23 DIAGNOSIS — R29898 Other symptoms and signs involving the musculoskeletal system: Secondary | ICD-10-CM | POA: Diagnosis not present

## 2024-03-23 DIAGNOSIS — E039 Hypothyroidism, unspecified: Secondary | ICD-10-CM | POA: Diagnosis not present

## 2024-03-23 DIAGNOSIS — E782 Mixed hyperlipidemia: Secondary | ICD-10-CM | POA: Diagnosis not present

## 2024-03-23 DIAGNOSIS — Q676 Pectus excavatum: Secondary | ICD-10-CM | POA: Diagnosis not present

## 2024-03-23 DIAGNOSIS — I700 Atherosclerosis of aorta: Secondary | ICD-10-CM | POA: Diagnosis not present

## 2024-03-23 NOTE — Telephone Encounter (Signed)
 Gabapentin refilled for 90 days - will need appointment scheduled for annual follow up

## 2024-03-30 ENCOUNTER — Encounter: Payer: Self-pay | Admitting: Internal Medicine

## 2024-03-30 ENCOUNTER — Ambulatory Visit: Admitting: Internal Medicine

## 2024-03-30 VITALS — BP 145/66 | HR 61 | Ht 62.0 in | Wt 121.2 lb

## 2024-03-30 DIAGNOSIS — J984 Other disorders of lung: Secondary | ICD-10-CM

## 2024-03-30 DIAGNOSIS — J479 Bronchiectasis, uncomplicated: Secondary | ICD-10-CM | POA: Diagnosis not present

## 2024-03-30 DIAGNOSIS — Q676 Pectus excavatum: Secondary | ICD-10-CM

## 2024-03-30 DIAGNOSIS — A310 Pulmonary mycobacterial infection: Secondary | ICD-10-CM

## 2024-03-30 MED ORDER — ALBUTEROL SULFATE (2.5 MG/3ML) 0.083% IN NEBU: 2.5000 mg | INHALATION_SOLUTION | Freq: Four times a day (QID) | RESPIRATORY_TRACT | 5 refills | Status: DC | PRN

## 2024-03-30 MED ORDER — SODIUM CHLORIDE 0.9 % IN NEBU: 3.0000 mL | INHALATION_SOLUTION | Freq: Three times a day (TID) | RESPIRATORY_TRACT | 6 refills | Status: AC

## 2024-03-30 MED ORDER — GABAPENTIN 300 MG PO CAPS: 300.0000 mg | ORAL_CAPSULE | Freq: Three times a day (TID) | ORAL | 3 refills | Status: DC

## 2024-03-30 NOTE — Patient Instructions (Addendum)
 It was a pleasure to see you today!  Please schedule follow up scheduled with myself in 6 months.  If my schedule is not open yet, we will contact you with a reminder closer to that time. Please call [phone] if you haven't heard from [place] a month before, and always call us sooner if issues or concerns arise. You can also send [place] a message through MyChart, but but aware that this is not to be used for urgent issues and it may take up to 5-7 days to receive a reply. Please be aware that you will likely be able to view your results before I have a chance to respond to them. Please give [place] 5 business days to respond to any non-urgent results.   Continue the airway clearance with albuterol nebs and saline nebs three times daily with flutter valve and vest.  If you are having one of your episodes of feeling tired and fatigued and having fevers, you may have pneumonia or a respiratory infection. Give me a call - we can try and antibiotic. If that does not help you may need to be seen, given a sputum culture, and may have resistant bacteria.  Call me sooner if issues or concerns with your breathing.   I have refilled the gabapentin to 300 mg 1 pill three times a day.

## 2024-03-30 NOTE — Progress Notes (Signed)
 fol

## 2024-03-30 NOTE — Progress Notes (Signed)
 Tami Estrada    [id number]    09-15-1951  Primary Care Physician:Hall, Kathleene Hazel, MD Date of Appointment: 03/30/2024 Established Patient Visit  Chief complaint:   Chief Complaint  Patient presents with   Follow-up     HPI: Tami Estrada is a 73 y.o. 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  Interval Updates: Here for follow up. Every 2-3 months had episodes of dyspnea, fatigue, headaches. Low grade fevers.  Has been treated with abx x 1 since last visit.   Airway clearance: Brings up sputum daily using albuterol nebs with saline three times a day. With flutter valve.  She did get the vest and uses this a couple times a day. This is helping her bring up mucus. Has been bringing up large volume of sputum, no hemoptysis.   Having chest wall pain which is controlled with gabapentin TID.   Feels breathing is getting worse over the last few months, year.   Appetite is ok. No weight loss, fevers, chills.   I have reviewed the patient's family social and past medical history and updated as appropriate.   Past Medical History:  Diagnosis Date   Abnormal findings on diagnostic imaging of other specified body structures    Bitten or stung by nonvenomous insect and other nonvenomous arthropods, initial encounter    Chest pain, unspecified    Disappearance and death of family member    GERD (gastroesophageal reflux disease)    Esophogeal dysmotility- 2010   History of [place] spotted fever    Hypothyroidism    Other fatigue    Pain in right shoulder    Thyroid disease    Toxic effect of contact with other venomous plant, accidental (unintentional), initial encounter    Unspecified osteoarthritis, unspecified site     Past Surgical History:  Procedure Laterality Date   BACK SURGERY     lumbar disc   NECK SURGERY     cervical disc   SHOULDER ARTHROSCOPY WITH  DISTAL CLAVICLE RESECTION Right 02/22/2015   Procedure: RIGHT SHOULDER ARTHROSCOPY WITH DISTAL CLAVICLE RESECTION, DEBRIDEMENT;  Surgeon: Vickki Hearing, MD;  Location: [facility];  Service: Orthopedics;  Laterality: Right;   SHOULDER OPEN ROTATOR CUFF REPAIR Right 02/22/2015   Procedure: ROTATOR CUFF REPAIR SHOULDER OPEN;  Surgeon: Vickki Hearing, MD;  Location: [facility];  Service: Orthopedics;  Laterality: Right;   THYROID SURGERY     thyroidectomy    Family History  Problem Relation Age of Onset   Diabetes Sister    Cancer Sister    Pulmonary fibrosis Brother    Lung cancer Brother    COPD Daughter     Social History   Occupational History   Not on file  Tobacco Use   Smoking status: Never    Passive exposure: Never   Smokeless tobacco: Never  Substance and Sexual Activity   Alcohol use: No   Drug use: No   Sexual activity: Yes    Birth control/protection: Post-menopausal     Physical Exam: Blood pressure (!) 145/66, pulse 61, height 5\' 2"  (1.575 m), weight 121 lb 3.2 oz (55 kg), SpO2 98%.  Gen:      Thin, no distress, room air Lungs:    pectus excavatum, otherwise lungs clear no wheezes or crackles CV:       RRR no mrg, no edema   Data  Reviewed: Imaging: I have personally reviewed the ct chest August 2024 waxing/waning tree in bud opacities consistnet with MAI infection, chronic unchanged bronchiectasis  PFTs:      Latest Ref Rng & Units 04/17/2020   11:43 AM  PFT Results  FVC-Pre L 2.51  P  FVC-Predicted Pre % 93  P  FVC-Post L 2.66  P  FVC-Predicted Post % 98  P  Pre FEV1/FVC % % 75  P  Post FEV1/FCV % % 77  P  FEV1-Pre L 1.89  P  FEV1-Predicted Pre % 92  P  FEV1-Post L 2.04  P  DLCO uncorrected ml/min/mmHg 18.44  P  DLCO UNC% % 103  P  DLCO corrected ml/min/mmHg 33.62  P  DLCO COR %Predicted % 188  P  DLVA Predicted % 223  P  TLC L 4.87  P  TLC % Predicted % 104  P  RV % Predicted % 112  P    P Preliminary result   I have personally reviewed  the patient's PFTs and they demonstrate restriction to ventilation  Labs: Lab Results  Component Value Date   NA 136 03/29/2023   K 3.6 03/29/2023   CO2 24 03/29/2023   GLUCOSE 99 03/29/2023   BUN 11 03/29/2023   CREATININE 0.75 03/29/2023   CALCIUM 9.4 03/29/2023   EGFR 85 03/29/2023   GFRNONAA >60 03/29/2023   Lab Results  Component Value Date   WBC 9.4 03/29/2023   HGB 14.0 03/29/2023   HCT 41.4 03/29/2023   MCV 92.0 03/29/2023   PLT 258 03/29/2023     Immunization status: Immunization History  Administered Date(s) Administered   Fluad Quad(high Dose 65+) 09/11/2020, 09/10/2021   Influenza Inj Mdck Quad Pf 10/08/2016   Influenza Split 09/13/2012   Influenza, High Dose Seasonal PF 10/18/2018   Influenza,inj,Quad PF,6+ Mos 09/14/2019   Influenza-Unspecified 11/24/2013, 09/03/2014, 10/16/2015, 10/07/2022   Moderna Sars-Covid-2 Vaccination 02/01/2020, 03/01/2020   Pneumococcal Conjugate-13 08/10/2018   Pneumococcal Polysaccharide-23 10/16/2015    Assessment:  Chronic Restrictive Lung Disease Pectus Excavatum s/p Nuss procedure July 2021 Chronic progressive MAI infection  Plan/Recommendations: We discussed her MAI infection progression at length. She has failed a trial of triple abx therapy due to adverse effects. She is not interested in pursuing at this time, despite her worsening pulmonary symptoms.   Currently needing abx about once a year for respiratory infection. She notes worsening dyspnea. Will obtain repeat PFTs prior to next visit.  Continue the airway clearance with albuterol nebs and saline nebs three times daily with flutter valve and vest.  We discussed that given her treatment goals, we will accept the risk for developing anti-microbial resistance since she is unlikely to ever desire full eradication treatment. (Should require prn antibiotics.)  If her symptoms do not improve with a round of abx, needs sputum cultures and potentially repeat  imaging.  Continue gabapentin for neuropathic pain related to pectus excavatum and post-operative symptoms.   Return to Care: Return in about 6 months (around 09/29/2024).   Durel Salts, MD Pulmonary and Critical Care Medicine [facility] [phone]

## 2024-04-21 DIAGNOSIS — J479 Bronchiectasis, uncomplicated: Secondary | ICD-10-CM | POA: Diagnosis not present

## 2024-04-21 DIAGNOSIS — A310 Pulmonary mycobacterial infection: Secondary | ICD-10-CM | POA: Diagnosis not present

## 2024-05-15 ENCOUNTER — Other Ambulatory Visit: Payer: Self-pay | Admitting: Internal Medicine

## 2024-05-22 DIAGNOSIS — A310 Pulmonary mycobacterial infection: Secondary | ICD-10-CM | POA: Diagnosis not present

## 2024-05-22 DIAGNOSIS — J479 Bronchiectasis, uncomplicated: Secondary | ICD-10-CM | POA: Diagnosis not present

## 2024-06-02 ENCOUNTER — Encounter: Payer: Self-pay | Admitting: Internal Medicine

## 2024-06-05 ENCOUNTER — Telehealth: Payer: Self-pay

## 2024-06-05 NOTE — Telephone Encounter (Signed)
 Copied from CRM [id number]. Topic: Clinical - Prescription Issue >> Jun 02, 2024  3:31 PM Tyronne Galloway wrote: Reason for CRM: Pt's duaghter Claryce Cruel is calling regarding the medication gabapentin  (NEURONTIN ) 300 MG capsule recently filled at [facility].  Pt received enough medication for 2 months, but Ida stated the patient needed the medication for a supply of 3 months. Please contact her at [phone]  ATC x1 Patient's daughter Claryce Cruel regarding prior message .

## 2024-06-05 NOTE — Telephone Encounter (Unsigned)
 Copied from CRM [id number]. Topic: Clinical - Prescription Issue >> Jun 02, 2024  3:31 PM Tami Estrada wrote: Reason for CRM: Pt's duaghter Tami Estrada is calling regarding the medication gabapentin  (NEURONTIN ) 300 MG capsule recently filled at [facility].  Pt received enough medication for 2 months, but Tami Estrada stated the patient needed the medication for a supply of 3 months. Please contact her at [phone]

## 2024-06-06 ENCOUNTER — Encounter: Payer: Self-pay | Admitting: Internal Medicine

## 2024-06-06 ENCOUNTER — Ambulatory Visit: Admitting: Internal Medicine

## 2024-06-06 VITALS — BP 130/74 | HR 61 | Ht 62.0 in | Wt 116.0 lb

## 2024-06-06 DIAGNOSIS — A310 Pulmonary mycobacterial infection: Secondary | ICD-10-CM

## 2024-06-06 DIAGNOSIS — J479 Bronchiectasis, uncomplicated: Secondary | ICD-10-CM

## 2024-06-06 DIAGNOSIS — Q676 Pectus excavatum: Secondary | ICD-10-CM | POA: Diagnosis not present

## 2024-06-06 DIAGNOSIS — J449 Chronic obstructive pulmonary disease, unspecified: Secondary | ICD-10-CM | POA: Diagnosis not present

## 2024-06-06 DIAGNOSIS — R0602 Shortness of breath: Secondary | ICD-10-CM

## 2024-06-06 MED ORDER — GABAPENTIN 600 MG PO TABS: 600.0000 mg | ORAL_TABLET | Freq: Three times a day (TID) | ORAL | 3 refills | Status: DC

## 2024-06-06 NOTE — Telephone Encounter (Signed)
 Spoke with patient's daughter Tami Estrada regarding prior message .Tami Estrada to contact the pharmacy and let therm know she must have been shorted the medication Gabapentin  300mg  . Patient was supposed to get a 90 day supply and patient is out of her medication. Patient does have a office visit with Dr.Desai today at 3:00 pm.    Patient's daughter Tami Estrada will try to contact [facility] also if not will wait ti see Dr.Desai . Nothing else further needed.

## 2024-06-06 NOTE — Progress Notes (Signed)
 Tami Estrada    [id number]    [birth year]  Primary Care Physician:Hall, Lethia Raveling, MD Date of Appointment: 06/06/2024 Established Patient Visit  Chief complaint:   Chief Complaint  Patient presents with   Follow-up    Sob and fatigue.     HPI: Tami Estrada is a 72 y.o. 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   Interval Updates: Here for follow up. Continues to have worsening dyspnea with minimal exertion that seems to be progressing since last visit.   PFTs have been ordered previously and are scheduled. Hasn't need any antibiotics.   Having chest wall pain which is controlled with gabapentin  TID and wants to know if she can increase the dose.   No fevers chills night sweats  Airway clearance: Brings up sputum daily using albuterol  nebs with saline three times a day. With flutter valve.  She did get the vest and uses this a couple times a day. This is helping her bring up mucus. Has been bringing up large volume of sputum, no hemoptysis.    I have reviewed the patient's family social and past medical history and updated as appropriate.   Past Medical History:  Diagnosis Date   Abnormal findings on diagnostic imaging of other specified body structures    Bitten or stung by nonvenomous insect and other nonvenomous arthropods, initial encounter    Chest pain, unspecified    Disappearance and death of family member    GERD (gastroesophageal reflux disease)    Esophogeal dysmotility- 2010   History of [place] spotted fever    Hypothyroidism    Other fatigue    Pain in right shoulder    Thyroid  disease    Toxic effect of contact with other venomous plant, accidental (unintentional), initial encounter    Unspecified osteoarthritis, unspecified site     Past Surgical History:  Procedure Laterality  Date   BACK SURGERY     lumbar disc   NECK SURGERY     cervical disc   SHOULDER ARTHROSCOPY WITH DISTAL CLAVICLE RESECTION Right 02/22/2015   Procedure: RIGHT SHOULDER ARTHROSCOPY WITH DISTAL CLAVICLE RESECTION, DEBRIDEMENT;  Surgeon: Darrin Emerald, MD;  Location: [facility];  Service: Orthopedics;  Laterality: Right;   SHOULDER OPEN ROTATOR CUFF REPAIR Right 02/22/2015   Procedure: ROTATOR CUFF REPAIR SHOULDER OPEN;  Surgeon: Darrin Emerald, MD;  Location: [facility];  Service: Orthopedics;  Laterality: Right;   THYROID  SURGERY     thyroidectomy    Family History  Problem Relation Age of Onset   Diabetes Sister    Cancer Sister    Pulmonary fibrosis Brother    Lung cancer Brother    COPD Daughter     Social History   Occupational History   Not on file  Tobacco Use   Smoking status: Never    Passive exposure: Never   Smokeless tobacco: Never  Substance and Sexual Activity   Alcohol use: No   Drug use: No   Sexual activity: Yes    Birth control/protection: Post-menopausal     Physical Exam: Blood pressure 130/74, pulse 61, height 5' 2 (1.575 m), weight 116 lb (52.6 kg), SpO2 98%.  Gen:      Thin, no distress, on room air Lungs:    pectus excavatum, ctab no wheeze CV:  RRR no mrg   Data Reviewed: Imaging: I have personally reviewed the ct chest August 2024 waxing/waning tree in bud opacities consistnet with MAI infection, chronic unchanged bronchiectasis  PFTs:      Latest Ref Rng & Units 04/17/2020   11:43 AM  PFT Results  FVC-Pre L 2.51  P  FVC-Predicted Pre % 93  P  FVC-Post L 2.66  P  FVC-Predicted Post % 98  P  Pre FEV1/FVC % % 75  P  Post FEV1/FCV % % 77  P  FEV1-Pre L 1.89  P  FEV1-Predicted Pre % 92  P  FEV1-Post L 2.04  P  DLCO uncorrected ml/min/mmHg 18.44  P  DLCO UNC% % 103  P  DLCO corrected ml/min/mmHg 33.62  P  DLCO COR %Predicted % 188  P  DLVA Predicted % 223  P  TLC L 4.87  P  TLC % Predicted % 104  P  RV % Predicted % 112  P     P Preliminary result   I have personally reviewed the patient's PFTs and they demonstrate restriction to ventilation  Labs: Lab Results  Component Value Date   NA 136 03/29/2023   K 3.6 03/29/2023   CO2 24 03/29/2023   GLUCOSE 99 03/29/2023   BUN 11 03/29/2023   CREATININE 0.75 03/29/2023   CALCIUM 9.4 03/29/2023   EGFR 66.0 03/17/2024   GFRNONAA >60 03/29/2023   Lab Results  Component Value Date   WBC 9.4 03/29/2023   HGB 14.0 03/29/2023   HCT 41.4 03/29/2023   MCV 92.0 03/29/2023   PLT 258 03/29/2023     Immunization status: Immunization History  Administered Date(s) Administered   Fluad Quad(high Dose 65+) 09/11/2020, 09/10/2021   Influenza Inj Mdck Quad Pf 10/08/2016   Influenza Split 09/13/2012   Influenza, High Dose Seasonal PF 10/18/2018   Influenza,inj,Quad PF,6+ Mos 09/14/2019   Influenza-Unspecified 11/24/2013, 09/03/2014, 10/16/2015, 10/07/2022   Moderna Sars-Covid-2 Vaccination 02/01/2020, 03/01/2020   Pneumococcal Conjugate-13 08/10/2018   Pneumococcal Polysaccharide-23 10/16/2015    Assessment:  Chronic Restrictive Lung Disease Pectus Excavatum s/p Nuss procedure July 2021 Chronic progressive MAI infection  Plan/Recommendations: We discussed her MAI infection progression at length. She has failed a trial of triple abx therapy due to adverse effects. She is not interested in pursuing at this time, despite her worsening pulmonary symptoms.   Currently needing abx about once a year for respiratory infection. She notes worsening dyspnea. PFTs ordered - will have her move them up to sooner.   Continue the airway clearance with albuterol  nebs and saline nebs three times daily with flutter valve and vest.  We discussed that given her treatment goals, we will accept the risk for developing anti-microbial resistance since she is unlikely to ever desire full eradication treatment. (Should require prn antibiotics.)  If her symptoms do not improve with a  round of abx, needs sputum cultures and potentially repeat imaging.  Increase gabapentin  to 600 mg TID for neuropathic pain related to pectus excavatum and post-operative symptoms.   Return to Care: Return in about 3 months (around 09/06/2024).   Louie Rover, MD Pulmonary and Critical Care Medicine [facility] [phone]

## 2024-06-06 NOTE — Patient Instructions (Addendum)
 It was a pleasure to see you today!  Please schedule follow up with myself in 3 months.  If my schedule is not open yet, we will contact you with a reminder closer to that time. Please call [phone] if you haven't heard from us  a month before, and always call us  sooner if issues or concerns arise. You can also send us  a message through MyChart, but but aware that this is not to be used for urgent issues and it may take up to 5-7 days to receive a reply. Please be aware that you will likely be able to view your results before I have a chance to respond to them. Please give us  5 business days to respond to any non-urgent results.    Sorry to hear about your chest pain. I have increased the gabapentin  dosing to 600 mg three times a day.   Please schedule your breathing testing sooner so I can better understand your lung function.   Continue the airway clearance with albuterol  nebs and saline nebs three times daily with flutter valve and vest.

## 2024-06-07 ENCOUNTER — Telehealth: Payer: Self-pay

## 2024-06-07 NOTE — Telephone Encounter (Signed)
 See telephone encounter. NFN

## 2024-06-13 ENCOUNTER — Ambulatory Visit
Admission: EM | Admit: 2024-06-13 | Discharge: 2024-06-13 | Disposition: A | Discharge status: Home / Self Care | Attending: Nurse Practitioner | Admitting: Nurse Practitioner

## 2024-06-13 DIAGNOSIS — R109 Unspecified abdominal pain: Secondary | ICD-10-CM | POA: Insufficient documentation

## 2024-06-13 DIAGNOSIS — N3000 Acute cystitis without hematuria: Secondary | ICD-10-CM | POA: Insufficient documentation

## 2024-06-13 LAB — POCT URINALYSIS DIP (MANUAL ENTRY)
Bilirubin, UA: NEGATIVE
Blood, UA: NEGATIVE
Glucose, UA: NEGATIVE mg/dL
Ketones, POC UA: NEGATIVE mg/dL
Nitrite, UA: POSITIVE — AB
Protein Ur, POC: NEGATIVE mg/dL
Spec Grav, UA: 1.02 (ref 1.010–1.025)
Urobilinogen, UA: 0.2 U/dL
pH, UA: 5.5 (ref 5.0–8.0)

## 2024-06-13 MED ORDER — CEPHALEXIN 500 MG PO CAPS: 500.0000 mg | ORAL_CAPSULE | Freq: Two times a day (BID) | ORAL | 0 refills | Status: AC

## 2024-06-13 NOTE — ED Provider Notes (Signed)
 [facility]    CSN: [id number] Arrival date & time: 06/13/24  1244      History   Chief Complaint No chief complaint on file.   HPI Tami Estrada is a 73 y.o. female.   Patient presents today with 1 day history of abdominal pain that has been constant since she woke up this morning.  Reports pain is worse with any movement.  She denies fever, nausea/vomiting, dysuria, urinary frequency or urgency, hematuria, and diarrhea.  No recent change in any medicines, recent exposure to dirty drinking water, recent foreign travel.  No known contact with similar symptoms.  She wonders if the pain may be related to the tick bite that she has on her abdomen.  No swelling or rash.  No headache or neck stiffness or bodyaches or chills.    Past Medical History:  Diagnosis Date   Abnormal findings on diagnostic imaging of other specified body structures    Bitten or stung by nonvenomous insect and other nonvenomous arthropods, initial encounter    Chest pain, unspecified    Disappearance and death of family member    GERD (gastroesophageal reflux disease)    Esophogeal dysmotility- 2010   History of [place] spotted fever    Hypothyroidism    Other fatigue    Pain in right shoulder    Thyroid  disease    Toxic effect of contact with other venomous plant, accidental (unintentional), initial encounter    Unspecified osteoarthritis, unspecified site     Patient Active Problem List   Diagnosis Date Noted   Yeast infection 05/12/2021   Pectus excavatum 07/25/2020   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   Bronchiectasis without complication (HCC) 07/25/2020   Elevated blood pressure reading 02/26/2020   Abnormal findings on diagnostic imaging of other specified body structures    Chest pain, unspecified    Disappearance and death of family member    Other fatigue    Pain in right shoulder    Toxic effect of contact with other venomous plant, accidental  (unintentional), initial encounter    Unspecified osteoarthritis, unspecified site    History of arthroscopy of right shoulder 02/25/2015   Rotator cuff tear    Arthritis, shoulder region    Nontraumatic rupture of right proximal biceps tendon    Common cold 12/20/2012   Back pain 12/20/2012   Dysuria 12/20/2012   Left knee pain 03/11/2012   Hypothyroidism 03/11/2012   Post-menopausal 03/11/2012    Past Surgical History:  Procedure Laterality Date   BACK SURGERY     lumbar disc   NECK SURGERY     cervical disc   SHOULDER ARTHROSCOPY WITH DISTAL CLAVICLE RESECTION Right 02/22/2015   Procedure: RIGHT SHOULDER ARTHROSCOPY WITH DISTAL CLAVICLE RESECTION, DEBRIDEMENT;  Surgeon: Taft FORBES Minerva, MD;  Location: [facility];  Service: Orthopedics;  Laterality: Right;   SHOULDER OPEN ROTATOR CUFF REPAIR Right 02/22/2015   Procedure: ROTATOR CUFF REPAIR SHOULDER OPEN;  Surgeon: Taft FORBES Minerva, MD;  Location: [facility];  Service: Orthopedics;  Laterality: Right;   THYROID  SURGERY     thyroidectomy    OB History   No obstetric history on file.      Home Medications    Prior to Admission medications   Medication Sig Start Date End Date Taking? Authorizing Provider  cephALEXin (KEFLEX) 500 MG capsule Take 1 capsule (500 mg total) by mouth 2 (two) times daily for 5 days. 06/13/24 06/18/24 Yes Chandra Harlene LABOR, NP  albuterol  (PROVENTIL ) (2.5  MG/3ML) 0.083% nebulizer solution Take 3 mLs (2.5 mg total) by nebulization every 6 (six) hours as needed for wheezing or shortness of breath. 03/30/24   Meade Verdon RAMAN, MD  albuterol  (VENTOLIN  HFA) 108 (90 Base) MCG/ACT inhaler TAKE 2 PUFFS BY MOUTH EVERY 6 HOURS AS NEEDED FOR WHEEZE OR SHORTNESS OF BREATH 05/16/24   Meade Verdon RAMAN, MD  gabapentin  (NEURONTIN ) 600 MG tablet Take 1 tablet (600 mg total) by mouth 3 (three) times daily. 06/06/24   Meade Verdon RAMAN, MD  ibuprofen  (ADVIL ) 800 MG tablet Take 800 mg by mouth 3 (three) times daily.    [provider]  levothyroxine  (SYNTHROID ) 75 MCG tablet Take 75 mcg by mouth daily before breakfast.    [provider]  Respiratory Therapy Supplies (FLUTTER) DEVI 1 Device by Does not apply route as needed. 04/25/20   Meade Verdon RAMAN, MD  sodium chloride  0.9 % nebulizer solution Take 3 mLs by nebulization in the morning, at noon, and at bedtime. 03/30/24   Desai, Nikita S, MD  sulfamethoxazole-trimethoprim (BACTRIM DS) 800-160 MG tablet Take 1 tablet by mouth 2 (two) times daily.    [provider]    Family History Family History  Problem Relation Age of Onset   Diabetes Sister    Cancer Sister    Pulmonary fibrosis Brother    Lung cancer Brother    COPD Daughter     Social History Social History   Tobacco Use   Smoking status: Never    Passive exposure: Never   Smokeless tobacco: Never  Substance Use Topics   Alcohol use: No   Drug use: No     Allergies   Hydrocodone    Review of Systems Review of Systems Per HPI  Physical Exam Triage Vital Signs ED Triage Vitals  Encounter Vitals Group     BP 06/13/24 1258 119/67     Girls Systolic BP Percentile --      Girls Diastolic BP Percentile --      Boys Systolic BP Percentile --      Boys Diastolic BP Percentile --      Pulse Rate 06/13/24 1258 66     Resp 06/13/24 1258 16     Temp 06/13/24 1258 98 F (36.7 C)     Temp Source 06/13/24 1258 Oral     SpO2 06/13/24 1258 92 %     Weight --      Height --      Head Circumference --      Peak Flow --      Pain Score 06/13/24 1300 10     Pain Loc --      Pain Education --      Exclude from Growth Chart --    No data found.  Updated Vital Signs BP 119/67 (BP Location: Right Arm)   Pulse 66   Temp 98 F (36.7 C) (Oral)   Resp 16   SpO2 92%   Visual Acuity Right Eye Distance:   Left Eye Distance:   Bilateral Distance:    Right Eye Near:   Left Eye Near:    Bilateral Near:     Physical Exam Vitals and nursing note reviewed.   Constitutional:      General: She is not in acute distress.    Appearance: She is not toxic-appearing.  HENT:     Mouth/Throat:     Mouth: Mucous membranes are moist.     Pharynx: Oropharynx is clear. No oropharyngeal exudate  or posterior oropharyngeal erythema.  Pulmonary:     Effort: Pulmonary effort is normal. No respiratory distress.  Abdominal:     General: Abdomen is flat. Bowel sounds are normal. There is no distension.     Palpations: Abdomen is soft. There is no mass.     Tenderness: There is no abdominal tenderness. There is no right CVA tenderness, left CVA tenderness or guarding.   Skin:    General: Skin is warm and dry.     Coloration: Skin is not jaundiced or pale.     Findings: No erythema.     Comments: Flesh-colored papule to right side of trunk; there is no surrounding erythema, warmth, active drainage   Neurological:     Mental Status: She is alert and oriented to person, place, and time.     Motor: No weakness.     Gait: Gait normal.   Psychiatric:        Behavior: Behavior is cooperative.      UC Treatments / Results  Labs (all labs ordered are listed, but only abnormal results are displayed) Labs Reviewed  POCT URINALYSIS DIP (MANUAL ENTRY) - Abnormal; Notable for the following components:      Result Value   Clarity, UA cloudy (*)    Nitrite, UA Positive (*)    Leukocytes, UA Small (1+) (*)    All other components within normal limits  URINE CULTURE    EKG   Radiology No results found.  Procedures Procedures (including critical care time)  Medications Ordered in UC Medications - No data to display  Initial Impression / Assessment and Plan / UC Course  I have reviewed the triage vital signs and the nursing notes.  Pertinent labs & imaging results that were available during my care of the patient were reviewed by me and considered in my medical decision making (see chart for details).   Patient is well-appearing, normotensive,  afebrile, not tachycardic, not tachypneic, oxygenating well on room air.   1. Abdominal pain, unspecified abdominal location 2. Acute cystitis without hematuria No red flags; no tenderness to palpation of abdomen Urinalysis is cloudy with positive nitrites, small leukocyte Estrace Suspect abdominal pain secondary to UTI, will treat with Keflex 500 mg twice daily for 5 days Urine culture is pending Strict ER and return precautions discussed with patient  The patient was given the opportunity to ask questions.  All questions answered to their satisfaction.  The patient is in agreement to this plan.   Final Clinical Impressions(s) / UC Diagnoses   Final diagnoses:  Abdominal pain, unspecified abdominal location  Acute cystitis without hematuria     Discharge Instructions      You have a urinary tract infection.  Take the Keflex twice daily for 5 days as prescribed to treat it.  Will contact you if the urine culture shows we need to change antibiotics this week.  If your symptoms worsen despite treatment, please seek care in the emergency room.    ED Prescriptions     Medication Sig Dispense Auth. Provider   cephALEXin (KEFLEX) 500 MG capsule Take 1 capsule (500 mg total) by mouth 2 (two) times daily for 5 days. 10 capsule Chandra Harlene LABOR, NP      PDMP not reviewed this encounter.   Chandra Harlene LABOR, NP 06/13/24 [phone]

## 2024-06-13 NOTE — Discharge Instructions (Signed)
 You have a urinary tract infection.  Take the Keflex twice daily for 5 days as prescribed to treat it.  Will contact you if the urine culture shows we need to change antibiotics this week.  If your symptoms worsen despite treatment, please seek care in the emergency room.

## 2024-06-13 NOTE — ED Triage Notes (Signed)
 Pt reports abdominal pain since this morning, states pain come every time she moves. Denies any nausea vomiting or diarrhea.

## 2024-06-15 LAB — URINE CULTURE

## 2024-06-16 ENCOUNTER — Ambulatory Visit (HOSPITAL_COMMUNITY): Payer: Self-pay

## 2024-06-16 LAB — URINE CULTURE: Culture: >=100000 — AB

## 2024-06-19 ENCOUNTER — Other Ambulatory Visit (HOSPITAL_COMMUNITY): Payer: Self-pay | Admitting: Internal Medicine

## 2024-06-19 DIAGNOSIS — Z79899 Other long term (current) drug therapy: Secondary | ICD-10-CM | POA: Diagnosis not present

## 2024-06-19 DIAGNOSIS — R531 Weakness: Secondary | ICD-10-CM

## 2024-06-19 DIAGNOSIS — R030 Elevated blood-pressure reading, without diagnosis of hypertension: Secondary | ICD-10-CM | POA: Diagnosis not present

## 2024-06-19 DIAGNOSIS — Q676 Pectus excavatum: Secondary | ICD-10-CM | POA: Diagnosis not present

## 2024-06-19 DIAGNOSIS — M79601 Pain in right arm: Secondary | ICD-10-CM | POA: Diagnosis not present

## 2024-06-19 DIAGNOSIS — H539 Unspecified visual disturbance: Secondary | ICD-10-CM

## 2024-06-19 DIAGNOSIS — R4781 Slurred speech: Secondary | ICD-10-CM

## 2024-06-19 DIAGNOSIS — J309 Allergic rhinitis, unspecified: Secondary | ICD-10-CM | POA: Diagnosis not present

## 2024-06-19 DIAGNOSIS — A310 Pulmonary mycobacterial infection: Secondary | ICD-10-CM | POA: Diagnosis not present

## 2024-06-19 DIAGNOSIS — R29898 Other symptoms and signs involving the musculoskeletal system: Secondary | ICD-10-CM | POA: Diagnosis not present

## 2024-06-20 ENCOUNTER — Ambulatory Visit (HOSPITAL_COMMUNITY)
Admission: RE | Admit: 2024-06-20 | Discharge: 2024-06-20 | Disposition: A | Discharge status: Home / Self Care | Source: Ambulatory Visit | Attending: Internal Medicine | Admitting: Internal Medicine

## 2024-06-20 DIAGNOSIS — J984 Other disorders of lung: Secondary | ICD-10-CM | POA: Insufficient documentation

## 2024-06-20 LAB — PULMONARY FUNCTION TEST
DL/VA % pred: 101 %
DL/VA: 4.28 ml/min/mmHg/L
DLCO unc % pred: 55 %
DLCO unc: 9.82 ml/min/mmHg
FEF 25-75 Post: 0.43 L/s
FEF 25-75 Pre: 1.02 L/s
FEF2575-%Change-Post: -57 %
FEF2575-%Pred-Post: 26 %
FEF2575-%Pred-Pre: 63 %
FEV1-%Change-Post: -19 %
FEV1-%Pred-Post: 55 %
FEV1-%Pred-Pre: 69 %
FEV1-Post: 1.09 L
FEV1-Pre: 1.35 L
FEV1FVC-%Change-Post: -8 %
FEV1FVC-%Pred-Pre: 100 %
FEV6-%Change-Post: -12 %
FEV6-%Pred-Post: 63 %
FEV6-%Pred-Pre: 72 %
FEV6-Post: 1.58 L
FEV6-Pre: 1.8 L
FEV6FVC-%Pred-Post: 105 %
FEV6FVC-%Pred-Pre: 105 %
FVC-%Change-Post: -12 %
FVC-%Pred-Post: 60 %
FVC-%Pred-Pre: 69 %
FVC-Post: 1.58 L
FVC-Pre: 1.8 L
Post FEV1/FVC ratio: 69 %
Post FEV6/FVC ratio: 100 %
Pre FEV1/FVC ratio: 75 %
Pre FEV6/FVC Ratio: 100 %
RV % pred: 77 %
RV: 1.68 L
TLC % pred: 71 %
TLC: 3.39 L

## 2024-06-20 MED ORDER — ALBUTEROL SULFATE (2.5 MG/3ML) 0.083% IN NEBU
2.5000 mg | INHALATION_SOLUTION | Freq: Once | RESPIRATORY_TRACT | Status: AC
  Administered 2024-06-20: 2.5 mg via RESPIRATORY_TRACT

## 2024-06-21 DIAGNOSIS — A310 Pulmonary mycobacterial infection: Secondary | ICD-10-CM | POA: Diagnosis not present

## 2024-06-21 DIAGNOSIS — J479 Bronchiectasis, uncomplicated: Secondary | ICD-10-CM | POA: Diagnosis not present

## 2024-06-26 ENCOUNTER — Other Ambulatory Visit (HOSPITAL_COMMUNITY): Payer: Self-pay | Admitting: Internal Medicine

## 2024-06-26 DIAGNOSIS — R4781 Slurred speech: Secondary | ICD-10-CM

## 2024-06-26 DIAGNOSIS — H539 Unspecified visual disturbance: Secondary | ICD-10-CM

## 2024-06-26 DIAGNOSIS — R531 Weakness: Secondary | ICD-10-CM

## 2024-06-27 ENCOUNTER — Ambulatory Visit (HOSPITAL_COMMUNITY)
Admission: RE | Admit: 2024-06-27 | Discharge: 2024-06-27 | Disposition: A | Discharge status: Home / Self Care | Source: Ambulatory Visit | Attending: Internal Medicine | Admitting: Internal Medicine

## 2024-06-27 DIAGNOSIS — R531 Weakness: Secondary | ICD-10-CM | POA: Insufficient documentation

## 2024-06-27 DIAGNOSIS — H539 Unspecified visual disturbance: Secondary | ICD-10-CM | POA: Diagnosis not present

## 2024-06-27 DIAGNOSIS — R413 Other amnesia: Secondary | ICD-10-CM | POA: Insufficient documentation

## 2024-06-27 DIAGNOSIS — R4781 Slurred speech: Secondary | ICD-10-CM | POA: Insufficient documentation

## 2024-06-27 DIAGNOSIS — R4189 Other symptoms and signs involving cognitive functions and awareness: Secondary | ICD-10-CM | POA: Diagnosis not present

## 2024-06-27 DIAGNOSIS — R519 Headache, unspecified: Secondary | ICD-10-CM | POA: Insufficient documentation

## 2024-06-27 MED ORDER — GADOBUTROL 1 MMOL/ML IV SOLN
5.0000 mL | Freq: Once | INTRAVENOUS | Status: AC | PRN
  Administered 2024-06-27: 5 mL via INTRAVENOUS

## 2024-07-12 ENCOUNTER — Ambulatory Visit: Payer: Self-pay

## 2024-07-12 NOTE — Telephone Encounter (Signed)
  FYI Only or Action Required?: Action required by provider: request for appointment.  Patient is followed in Pulmonology for pectus excavatum, last seen on 06/06/2024 by Meade Verdon RAMAN, MD.  Called Nurse Triage reporting Fatigue and Shortness of Breath.  Symptoms began 2 weeks ago.  Interventions attempted: Rescue inhaler.  Symptoms are: stable.  Triage Disposition: See Physician Within 24 Hours  Patient/caregiver understands and will follow disposition?: No, refuses disposition                             Copied from CRM #[id number]. Topic: Clinical - Red Word Triage >> Jul 12, 2024  3:49 PM Nathanel DEL wrote: Red Word that prompted transfer to Nurse Triage: pt has been weaker nd harder to breathe the last 2 weeks.  Calling to schedule fup. Reason for Disposition  [1] MODERATE weakness (e.g., interferes with work, school, normal activities) AND [2] persists > 3 days  Answer Assessment - Initial Assessment Questions E2C2 Pulmonary Triage - Initial Assessment Questions  1. Chief Complaint (e.g., cough, sob, wheezing, fever, chills, sweat or additional symptoms) *Go to specific symptom protocol after initial questions. Generalized weakness/fatigue and mild SOB   2. How long have symptoms been present? 2 weeks   3. Have you used any OTC meds to help with symptoms? If yes, ask What medications? Denies   4. Have you used your inhalers/maintenance medication? If yes, What medications? Albuterol  bid, states she is supposed to only be using it once a day   5. Do you wear supplemental oxygen? If yes, How many liters are you supposed to use? Denies  1. DESCRIPTION: Describe how you are feeling.     States she is feeling tired all the time 2. SEVERITY: How bad is it?  Can you stand and walk?     States she is able to walk normally and thinks weakness/fatigue is due to heat/humidity 3. ONSET: When did these symptoms begin? (e.g., hours,  days, weeks, months)     About 2 weeks ago  4. CAUSE: What do you think is causing the weakness or fatigue? (e.g., not drinking enough fluids, medical problem, trouble sleeping)     Heat/humidity 6. OTHER SYMPTOMS: Do you have any other symptoms? (e.g., chest pain, fever, cough, SOB, vomiting, diarrhea, bleeding, other areas of pain)     Mild SOB- states she has SOB upon exertion at baseline, denies SOB at rest, more coughing than usual, denies falls/fainting, possible low grade fever    Patient originally called in to schedule a 6 month follow-up. Patient was sent to NT for mentioning increased difficulty breathing and weakness/fatigue. This RN advised patient to be seen within 24 hours. This RN offered patient appointment in office tomorrow. Patient declined and stated she is tired from traveling to [place]  and wants to wait awhile to come in. Patient is requesting a call back from office to schedule further out. Please follow-up with patient.  Protocols used: Weakness (Generalized) and Fatigue-A-AH

## 2024-07-13 NOTE — Telephone Encounter (Signed)
 Not sure why this routed to the triage pool, I meant to route to the front desk

## 2024-07-13 NOTE — Telephone Encounter (Signed)
Routing to front office for scheduling.

## 2024-07-22 DIAGNOSIS — A310 Pulmonary mycobacterial infection: Secondary | ICD-10-CM | POA: Diagnosis not present

## 2024-07-22 DIAGNOSIS — J479 Bronchiectasis, uncomplicated: Secondary | ICD-10-CM | POA: Diagnosis not present

## 2024-07-25 DIAGNOSIS — H5203 Hypermetropia, bilateral: Secondary | ICD-10-CM | POA: Diagnosis not present

## 2024-07-25 DIAGNOSIS — H52223 Regular astigmatism, bilateral: Secondary | ICD-10-CM | POA: Diagnosis not present

## 2024-07-25 DIAGNOSIS — H524 Presbyopia: Secondary | ICD-10-CM | POA: Diagnosis not present

## 2024-08-08 ENCOUNTER — Other Ambulatory Visit: Payer: Self-pay | Admitting: Internal Medicine

## 2024-08-22 DIAGNOSIS — A310 Pulmonary mycobacterial infection: Secondary | ICD-10-CM | POA: Diagnosis not present

## 2024-08-22 DIAGNOSIS — J479 Bronchiectasis, uncomplicated: Secondary | ICD-10-CM | POA: Diagnosis not present

## 2024-09-18 DIAGNOSIS — E782 Mixed hyperlipidemia: Secondary | ICD-10-CM | POA: Diagnosis not present

## 2024-09-18 DIAGNOSIS — E039 Hypothyroidism, unspecified: Secondary | ICD-10-CM | POA: Diagnosis not present

## 2024-09-19 LAB — LAB REPORT - SCANNED: EGFR: 73

## 2024-09-21 DIAGNOSIS — A310 Pulmonary mycobacterial infection: Secondary | ICD-10-CM | POA: Diagnosis not present

## 2024-09-22 DIAGNOSIS — Z23 Encounter for immunization: Secondary | ICD-10-CM | POA: Diagnosis not present

## 2024-09-22 DIAGNOSIS — A310 Pulmonary mycobacterial infection: Secondary | ICD-10-CM | POA: Diagnosis not present

## 2024-09-22 DIAGNOSIS — E039 Hypothyroidism, unspecified: Secondary | ICD-10-CM | POA: Diagnosis not present

## 2024-09-22 DIAGNOSIS — M79601 Pain in right arm: Secondary | ICD-10-CM | POA: Diagnosis not present

## 2024-09-22 DIAGNOSIS — R531 Weakness: Secondary | ICD-10-CM | POA: Diagnosis not present

## 2024-09-22 DIAGNOSIS — R29898 Other symptoms and signs involving the musculoskeletal system: Secondary | ICD-10-CM | POA: Diagnosis not present

## 2024-09-22 DIAGNOSIS — Z0000 Encounter for general adult medical examination without abnormal findings: Secondary | ICD-10-CM | POA: Diagnosis not present

## 2024-09-22 DIAGNOSIS — Q676 Pectus excavatum: Secondary | ICD-10-CM | POA: Diagnosis not present

## 2024-09-22 DIAGNOSIS — Z0001 Encounter for general adult medical examination with abnormal findings: Secondary | ICD-10-CM | POA: Diagnosis not present

## 2024-09-22 DIAGNOSIS — I700 Atherosclerosis of aorta: Secondary | ICD-10-CM | POA: Diagnosis not present

## 2024-09-22 DIAGNOSIS — E782 Mixed hyperlipidemia: Secondary | ICD-10-CM | POA: Diagnosis not present

## 2024-09-22 DIAGNOSIS — J309 Allergic rhinitis, unspecified: Secondary | ICD-10-CM | POA: Diagnosis not present

## 2024-10-22 DIAGNOSIS — J479 Bronchiectasis, uncomplicated: Secondary | ICD-10-CM | POA: Diagnosis not present

## 2024-10-22 DIAGNOSIS — A310 Pulmonary mycobacterial infection: Secondary | ICD-10-CM | POA: Diagnosis not present

## 2024-11-02 ENCOUNTER — Ambulatory Visit (INDEPENDENT_AMBULATORY_CARE_PROVIDER_SITE_OTHER): Payer: PRIVATE HEALTH INSURANCE | Admitting: Internal Medicine

## 2024-11-02 ENCOUNTER — Encounter: Payer: Self-pay | Admitting: Internal Medicine

## 2024-11-02 ENCOUNTER — Telehealth: Payer: Self-pay

## 2024-11-02 VITALS — BP 143/78 | HR 62 | Temp 98.0°F | Ht 62.0 in | Wt 117.6 lb

## 2024-11-02 DIAGNOSIS — Q676 Pectus excavatum: Secondary | ICD-10-CM | POA: Diagnosis not present

## 2024-11-02 DIAGNOSIS — A310 Pulmonary mycobacterial infection: Secondary | ICD-10-CM

## 2024-11-02 DIAGNOSIS — J984 Other disorders of lung: Secondary | ICD-10-CM

## 2024-11-02 DIAGNOSIS — R0789 Other chest pain: Secondary | ICD-10-CM | POA: Diagnosis not present

## 2024-11-02 DIAGNOSIS — G8918 Other acute postprocedural pain: Secondary | ICD-10-CM | POA: Diagnosis not present

## 2024-11-02 MED ORDER — GABAPENTIN 800 MG PO TABS: 800.0000 mg | ORAL_TABLET | Freq: Three times a day (TID) | ORAL | 3 refills | Status: AC

## 2024-11-02 NOTE — Patient Instructions (Addendum)
 It was a pleasure to see you today!  Please schedule follow up with Dr. Adrien in 3 months. Please call sooner [phone] if issues or concerns arise. You can also send us  a message through MyChart, but but aware that this is not to be used for urgent issues and it may take up to 5-7 days to receive a reply. Please be aware that you will likely be able to view your results before I have a chance to respond to them. Please give us  5 business days to respond to any non-urgent results.    Breathing testing shows worsening lung function over the last four years, likely due to worsening lung disease but also influenced by the pain. I am referring you to pulmonary rehab at [facility] for help with shortness of breath.   Continue the airway clearance with albuterol  nebs and saline nebs 2-3 times daily with flutter valve and vest.  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  May need to establish with pain management doctor for treatment beyond this.

## 2024-11-02 NOTE — Progress Notes (Signed)
 Tami Estrada    [id number]    July 02, 1951  Primary Care Physician:Hall, Norleen PEDLAR, MD Date of Appointment: 11/02/2024 Established Patient Visit  Chief complaint:   Chief Complaint  Patient presents with   Shortness of Breath    Follow up sob     HPI: Tami Estrada is a 73 y.o. 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   Interval Updates: Here for follow up. Her PFTS show worsening restriction to ventilation with a significant drop in FVC over the last 4 years.   She continues to have chest wall pain with deep inspiration. She increased gabapentin  to 600 mg TID. This does help a little bit.   No fevers chills night sweats.    Airway clearance: She brings up sputum daily with albuterol  and saline nebs with percussive vest. She does this twice a day. Brings up a small cup full of mucus each time.   No interval exacerbations of her bronchiectasis.   Continues to have poor appetite.    I have reviewed the patient's family social and past medical history and updated as appropriate.   Past Medical History:  Diagnosis Date   Abnormal findings on diagnostic imaging of other specified body structures    Bitten or stung by nonvenomous insect and other nonvenomous arthropods, initial encounter    Chest pain, unspecified    Disappearance and death of family member    GERD (gastroesophageal reflux disease)    Esophogeal dysmotility- 2010   History of Rocky Mountain spotted fever    Hypothyroidism    Other fatigue    Pain in right shoulder    Thyroid  disease    Toxic effect of contact with other venomous plant, accidental (unintentional), initial encounter    Unspecified osteoarthritis, unspecified site     Past Surgical History:  Procedure Laterality Date   BACK SURGERY     lumbar disc   NECK SURGERY      cervical disc   SHOULDER ARTHROSCOPY WITH DISTAL CLAVICLE RESECTION Right 02/22/2015   Procedure: RIGHT SHOULDER ARTHROSCOPY WITH DISTAL CLAVICLE RESECTION, DEBRIDEMENT;  Surgeon: Taft FORBES Minerva, MD;  Location: [facility];  Service: Orthopedics;  Laterality: Right;   SHOULDER OPEN ROTATOR CUFF REPAIR Right 02/22/2015   Procedure: ROTATOR CUFF REPAIR SHOULDER OPEN;  Surgeon: Taft FORBES Minerva, MD;  Location: [facility];  Service: Orthopedics;  Laterality: Right;   THYROID  SURGERY     thyroidectomy    Family History  Problem Relation Age of Onset   Diabetes Sister    Cancer Sister    Pulmonary fibrosis Brother    Lung cancer Brother    COPD Daughter     Social History   Occupational History   Not on file  Tobacco Use   Smoking status: Never    Passive exposure: Never   Smokeless tobacco: Never  Substance and Sexual Activity   Alcohol use: No   Drug use: No   Sexual activity: Yes    Birth control/protection: Post-menopausal     Physical Exam: Blood pressure (!) 143/78, pulse 62, temperature 98 F (36.7 C), temperature source Oral, height 5' 2 (1.575 m), weight 117 lb 9.6 oz (53.3 kg), SpO2 98%.  Gen:      Thin, no distress, on room air Lungs:    pectus excavatum, ctab no wheeze CV:  RRR no mrg   Data Reviewed: Imaging: I have personally reviewed the ct chest August 2024 waxing/waning tree in bud opacities consistnet with MAI infection, chronic unchanged bronchiectasis  PFTs:      Latest Ref Rng & Units 06/20/2024    8:09 AM 04/17/2020   11:43 AM  PFT Results  FVC-Pre L 1.80  2.51  P  FVC-Predicted Pre % 69  93  P  FVC-Post L 1.58  2.66  P  FVC-Predicted Post % 60  98  P  Pre FEV1/FVC % % 75  75  P  Post FEV1/FCV % % 69  77  P  FEV1-Pre L 1.35  1.89  P  FEV1-Predicted Pre % 69  92  P  FEV1-Post L 1.09  2.04  P  DLCO uncorrected ml/min/mmHg 9.82  18.44  P  DLCO UNC% % 55  103  P  DLCO corrected ml/min/mmHg  33.62  P  DLCO COR %Predicted %  188  P  DLVA  Predicted % 101  223  P  TLC L 3.39  4.87  P  TLC % Predicted % 71  104  P  RV % Predicted % 77  112  P    P Preliminary result   I have personally reviewed the patient's PFTs and they demonstrate restriction to ventilation  Labs: Lab Results  Component Value Date   NA 136 03/29/2023   K 3.6 03/29/2023   CO2 24 03/29/2023   GLUCOSE 99 03/29/2023   BUN 11 03/29/2023   CREATININE 0.75 03/29/2023   CALCIUM 9.4 03/29/2023   EGFR 73.0 09/19/2024   GFRNONAA >60 03/29/2023   Lab Results  Component Value Date   WBC 9.4 03/29/2023   HGB 14.0 03/29/2023   HCT 41.4 03/29/2023   MCV 92.0 03/29/2023   PLT 258 03/29/2023     Immunization status: Immunization History  Administered Date(s) Administered   Fluad Quad(high Dose 65+) 09/11/2020, 09/10/2021   Fluad Trivalent(High Dose 65+) 10/01/2024   INFLUENZA, HIGH DOSE SEASONAL PF 10/18/2018   Influenza Inj Mdck Quad Pf 10/08/2016   Influenza Split 09/13/2012   Influenza,inj,Quad PF,6+ Mos 09/14/2019   Influenza-Unspecified 11/24/2013, 09/03/2014, 10/16/2015, 10/07/2022   Moderna Sars-Covid-2 Vaccination 02/01/2020, 03/01/2020   Pneumococcal Conjugate-13 08/10/2018   Pneumococcal Polysaccharide-23 10/16/2015    Assessment:  Chronic Restrictive Lung Disease Pectus Excavatum s/p Nuss procedure July 2021 Chronic progressive MAI infection  Plan/Recommendations: We discussed her MAI infection progression at length. She has failed a trial of triple abx therapy due to adverse effects. She is not interested in pursuing at this time, despite her worsening pulmonary symptoms.   Breathing testing shows worsening lung function over the last four years, likely due to worsening lung disease but also influenced by the pain. I am referring you to pulmonary rehab at [facility] for help with shortness of breath.   Continue the airway clearance with albuterol  nebs and saline nebs 2-3 times daily with flutter valve and vest.  We discussed that  given her treatment goals, we will accept the risk for developing anti-microbial resistance since she is unlikely to ever desire full eradication treatment. (Should require prn antibiotics.)  If her symptoms do not improve with a round of abx, needs sputum cultures and potentially repeat imaging.  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   Return to Care: Return in about 3 months (around 02/02/2025) for Dr Adrien.   Verdon Gore, MD Pulmonary and  Critical Care Medicine [facility] Office:[phone]

## 2024-11-02 NOTE — Telephone Encounter (Signed)
 Copied from CRM [id number]. Topic: General - Running Late >> Nov 02, 2024 10:04 AM Tami Estrada wrote: Patient/patient representative is calling because they are running late for an appointment.  Patient is stuck in heavy traffic, expecting to arrive around 10:23am   Noted.

## 2024-11-21 ENCOUNTER — Encounter (HOSPITAL_COMMUNITY): Attending: Internal Medicine

## 2024-11-27 ENCOUNTER — Encounter (HOSPITAL_COMMUNITY)

## 2024-11-29 ENCOUNTER — Encounter (HOSPITAL_COMMUNITY): Admission: RE | Admit: 2024-11-29

## 2024-11-29 ENCOUNTER — Encounter (HOSPITAL_COMMUNITY)

## 2024-12-04 ENCOUNTER — Ambulatory Visit (HOSPITAL_COMMUNITY)

## 2024-12-22 ENCOUNTER — Other Ambulatory Visit: Payer: Self-pay

## 2024-12-22 ENCOUNTER — Encounter: Payer: Self-pay | Admitting: Emergency Medicine

## 2024-12-22 ENCOUNTER — Ambulatory Visit: Payer: Self-pay | Admitting: Internal Medicine

## 2024-12-22 ENCOUNTER — Ambulatory Visit
Admission: EM | Admit: 2024-12-22 | Discharge: 2024-12-22 | Disposition: A | Discharge status: Home / Self Care | Attending: Nurse Practitioner | Admitting: Nurse Practitioner

## 2024-12-22 DIAGNOSIS — A310 Pulmonary mycobacterial infection: Secondary | ICD-10-CM

## 2024-12-22 LAB — POCT INFLUENZA A/B
Influenza A, POC: NEGATIVE
Influenza B, POC: NEGATIVE

## 2024-12-22 LAB — POC SOFIA SARS ANTIGEN FIA: SARS Coronavirus 2 Ag: NEGATIVE

## 2024-12-22 MED ORDER — AZITHROMYCIN 250 MG PO TABS: ORAL_TABLET | ORAL | 0 refills | Status: AC

## 2024-12-22 MED ORDER — FLUCONAZOLE 150 MG PO TABS: 150.0000 mg | ORAL_TABLET | Freq: Every day | ORAL | 0 refills | Status: AC

## 2024-12-22 NOTE — Telephone Encounter (Signed)
 I called and spoke with patient, advised of recommendations per Landry Ferrari NP, she verbalized understanding.  Scheduled 1 week f/u with Dr. Adrien Form on 12/28/24 at 1:30 pm, advised to arrive by 1:15 pm for check in.  She verbalized understanding.  Nothing further needed.

## 2024-12-22 NOTE — Telephone Encounter (Signed)
 Attempted to contact [facility] x3 with no answer. Will send high priority message to clinic now.

## 2024-12-22 NOTE — Discharge Instructions (Addendum)
-  Your influenza and covid tests were negative -Start your azithromycin  antibiotic as prescribed by your pulmonologist  -If you develop a yeast infection, start the diflucan . Take pill #1. If symptoms persist in 3 days, take pill #2.  -Continue albuterol  as directed -Follow-up with pulmonologist Dr. Adrien Form on 12/28/24 at 1:30 pm -If your cough, fevers, etc worsen before you're able to see Dr. Form - follow-up with us  or PCP

## 2024-12-22 NOTE — Telephone Encounter (Signed)
 Reason for Disposition  Difficulty breathing  Protocols used: Chest Pain-A-AH

## 2024-12-22 NOTE — Telephone Encounter (Addendum)
 [email or web address] Pulmonary Triage - Initial Assessment Questions Chief Complaint (e.g., cough, sob, wheezing, fever, chills, sweat or additional symptoms) *Go to specific symptom protocol after initial questions. CP, HA, fever yesterday  How long have symptoms been present? 4 days  Have you tested for COVID or Flu? Note: If not, ask patient if a home test can be taken. If so, instruct patient to call back for positive results. No  MEDICINES:   Have you used any OTC meds to help with symptoms? Yes If yes, ask What medications? ibuprofen   Have you used your inhalers/maintenance medication?  If yes, What medications?   If inhaler, ask How many puffs and how often? Note: Review instructions on medication in the chart.   OXYGEN: Do you wear supplemental oxygen? No If yes, How many liters are you supposed to use?   Do you monitor your oxygen levels?  If yes, What is your reading (oxygen level) today?   What is your usual oxygen saturation reading?  (Note: Pulmonary O2 sats should be 90% or greater)   Patient was last seen in primary   Lung infection,  Tuesday, ha, fatigue, cp.  Yesterday fever,   Copied from CRM #[id number]. Topic: Clinical - Red Word Triage >> Dec 22, 2024  8:29 AM Corean SAUNDERS wrote: Red Word that prompted transfer to Nurse Triage: Fever 106, extreme weakness/ lack of energy and a headache. Answer Assessment - Initial Assessment Questions 1. LOCATION: Where does it hurt?       Chest constant pain 2. RADIATION: Does the pain go anywhere else? (e.g., into neck, jaw, arms, back)     *No Answer* 3. ONSET: When did the chest pain begin? (Minutes, hours or days)      Tuesday 4. PATTERN: Does the pain come and go, or has it been constant since it started?  Does it get worse with exertion?      constant 5. DURATION: How long does it last (e.g., seconds, minutes, hours)     *No Answer* 6. SEVERITY: How bad is the pain?  (e.g., Scale 1-10;  mild, moderate, or severe)     *No Answer* 7. CARDIAC RISK FACTORS: Do you have any history of heart problems or risk factors for heart disease? (e.g., angina, prior heart attack; diabetes, high blood pressure, high cholesterol, smoker, or strong family history of heart disease)     *No Answer* 8. PULMONARY RISK FACTORS: Do you have any history of lung disease?  (e.g., blood clots in lung, asthma, emphysema, birth control pills)     States has lund dz 9. CAUSE: What do you think is causing the chest pain?     Lung infection 10. OTHER SYMPTOMS: Do you have any other symptoms? (e.g., dizziness, nausea, vomiting, sweating, fever, difficulty breathing, cough)       *No Answer* 11. PREGNANCY: Is there any chance you are pregnant? When was your last menstrual period?       *No Answer*  Protocols used: Chest Pain-A-AH

## 2024-12-22 NOTE — Telephone Encounter (Signed)
 Patient started to feel sick 4 days ago. She reports headaches and temp 100.6 yesterday. Occasional productive cough with purulent/brown mucus. Associated mid sternal/right pleuritic pain. She declined ED evaluation. She has a history of MAI.   I will send in zpack. Recommend using saline nebulizer, vest 3 times a day and flutter valve. Advised patient check covid/flu   Please schedule visit with APP or MD next week for follow-up

## 2024-12-22 NOTE — ED Triage Notes (Signed)
 Pt presents for covid/flu test. Pt has hx of chronic lung infections, currently taking abx.

## 2024-12-22 NOTE — ED Provider Notes (Signed)
 " [facility]    CSN: [id number] Arrival date & time: 12/22/24  1415      History   Chief Complaint Chief Complaint  Patient presents with   Fever    HPI Tami Estrada is a 74 y.o. female presenting with request for covid/influenza test.   Pt presents for covid/flu test. Pt has hx of chronic lung infections, currently taking abx (azithromycin ) which was sent in today by pulm. Here today with daughter who helps provide history. States few days of fatigue and high temperatures per home thermometer, which is a forehead thermometer (as high as 100.6 one day ago). States fever broke this morning. Last albuterol  was about 10 hours ago, has used once today. States difficulty taking full inspiration at baseline, unchanged.   Her pulmonologist is aware of her symptoms. Per their mychart note, Patient started to feel sick 4 days ago. She reports headaches and temp 100.6 yesterday. Occasional productive cough with purulent/brown mucus. Associated mid sternal/right pleuritic pain. She declined ED evaluation. She has a history of MAI.    I will send in zpack. Recommend using saline nebulizer, vest 3 times a day and flutter valve. Advised patient check covid/flu   Pt w MAI (Mycobacterium Avium Intracellulare) infection; per 10/2024 pulm note, she has failed triple abx therapy due to intolerance.     HPI  Past Medical History:  Diagnosis Date   Abnormal findings on diagnostic imaging of other specified body structures    Bitten or stung by nonvenomous insect and other nonvenomous arthropods, initial encounter    Chest pain, unspecified    Disappearance and death of family member    GERD (gastroesophageal reflux disease)    Esophogeal dysmotility- 2010   History of Rocky [place] spotted fever    Hypothyroidism    Other fatigue    Pain in right shoulder    Thyroid  disease    Toxic effect of contact with other venomous plant, accidental (unintentional), initial encounter     Unspecified osteoarthritis, unspecified site     Patient Active Problem List   Diagnosis Date Noted   Yeast infection 05/12/2021   Pectus excavatum 07/25/2020   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   Bronchiectasis without complication (HCC) 07/25/2020   Elevated blood pressure reading 02/26/2020   Abnormal findings on diagnostic imaging of other specified body structures    Chest pain, unspecified    Disappearance and death of family member    Other fatigue    Pain in right shoulder    Toxic effect of contact with other venomous plant, accidental (unintentional), initial encounter    Unspecified osteoarthritis, unspecified site    History of arthroscopy of right shoulder 02/25/2015   Rotator cuff tear    Arthritis, shoulder region    Nontraumatic rupture of right proximal biceps tendon    Common cold 12/20/2012   Back pain 12/20/2012   Dysuria 12/20/2012   Left knee pain 03/11/2012   Hypothyroidism 03/11/2012   Post-menopausal 03/11/2012    Past Surgical History:  Procedure Laterality Date   BACK SURGERY     lumbar disc   NECK SURGERY     cervical disc   SHOULDER ARTHROSCOPY WITH DISTAL CLAVICLE RESECTION Right 02/22/2015   Procedure: RIGHT SHOULDER ARTHROSCOPY WITH DISTAL CLAVICLE RESECTION, DEBRIDEMENT;  Surgeon: Taft FORBES Minerva, MD;  Location: [facility];  Service: Orthopedics;  Laterality: Right;   SHOULDER OPEN ROTATOR CUFF REPAIR Right 02/22/2015   Procedure: ROTATOR CUFF REPAIR SHOULDER OPEN;  Surgeon: Taft FORBES  Margrette, MD;  Location: [facility];  Service: Orthopedics;  Laterality: Right;   THYROID  SURGERY     thyroidectomy    OB History   No obstetric history on file.      Home Medications    Prior to Admission medications  Medication Sig Start Date End Date Taking? Authorizing Provider  fluconazole  (DIFLUCAN ) 150 MG tablet Take 1 tablet (150 mg total) by mouth daily. -If you develop a yeast infection, start the diflucan . Take pill #1. If symptoms  persist in 3 days, take pill #2. 12/22/24  Yes Aisia Correira E, PA-C  albuterol  (PROVENTIL ) (2.5 MG/3ML) 0.083% nebulizer solution Take 3 mLs (2.5 mg total) by nebulization every 6 (six) hours as needed for wheezing or shortness of breath. 03/30/24   Meade Verdon RAMAN, MD  albuterol  (VENTOLIN  HFA) [phone] Base) MCG/ACT inhaler TAKE 2 PUFFS BY MOUTH EVERY 6 HOURS AS NEEDED FOR WHEEZE OR SHORTNESS OF BREATH 05/16/24   Meade Verdon RAMAN, MD  azithromycin  (ZITHROMAX ) 250 MG tablet Per Zpack instructions 12/22/24   Hope Almarie ORN, NP  gabapentin  (NEURONTIN ) 800 MG tablet Take 1 tablet (800 mg total) by mouth 3 (three) times daily. 11/02/24   Meade Verdon RAMAN, MD  ibuprofen  (ADVIL ) 800 MG tablet Take 800 mg by mouth 3 (three) times daily.    [provider]  levothyroxine  (SYNTHROID ) 75 MCG tablet Take 75 mcg by mouth daily before breakfast.    [provider]  Respiratory Therapy Supplies (FLUTTER) DEVI 1 Device by Does not apply route as needed. 04/25/20   Meade Verdon RAMAN, MD  sodium chloride  0.9 % nebulizer solution Take 3 mLs by nebulization in the morning, at noon, and at bedtime. 03/30/24   Meade Verdon RAMAN, MD    Family History Family History  Problem Relation Age of Onset   Diabetes Sister    Cancer Sister    Pulmonary fibrosis Brother    Lung cancer Brother    COPD Daughter     Social History Social History[1]   Allergies   Hydrocodone    Review of Systems Review of Systems  Constitutional:  Positive for fever. Negative for appetite change and chills.  HENT:  Positive for congestion. Negative for ear pain, rhinorrhea, sinus pressure, sinus pain and sore throat.   Eyes:  Negative for redness and visual disturbance.  Respiratory:  Positive for cough. Negative for chest tightness, shortness of breath and wheezing.   Cardiovascular:  Negative for chest pain and palpitations.  Gastrointestinal:  Negative for abdominal pain, constipation, diarrhea, nausea and vomiting.   Genitourinary:  Negative for dysuria, frequency and urgency.  Musculoskeletal:  Negative for myalgias.  Neurological:  Negative for dizziness, weakness and headaches.  Psychiatric/Behavioral:  Negative for confusion.   All other systems reviewed and are negative.    Physical Exam Triage Vital Signs ED Triage Vitals  Encounter Vitals Group     BP      Girls Systolic BP Percentile      Girls Diastolic BP Percentile      Boys Systolic BP Percentile      Boys Diastolic BP Percentile      Pulse      Resp      Temp      Temp src      SpO2      Weight      Height      Head Circumference      Peak Flow      Pain Score  Pain Loc      Pain Education      Exclude from Growth Chart    No data found.  Updated Vital Signs BP (!) 142/81 (BP Location: Right Arm)   Pulse 89   Temp 100.2 F (37.9 C) (Oral)   Resp 20   SpO2 92%   Visual Acuity Right Eye Distance:   Left Eye Distance:   Bilateral Distance:    Right Eye Near:   Left Eye Near:    Bilateral Near:     Physical Exam Vitals reviewed.  Constitutional:      General: She is not in acute distress.    Appearance: Normal appearance. She is not ill-appearing.  HENT:     Head: Normocephalic and atraumatic.     Right Ear: Tympanic membrane, ear canal and external ear normal. No tenderness. No middle ear effusion. There is no impacted cerumen. Tympanic membrane is not perforated, erythematous, retracted or bulging.     Left Ear: Tympanic membrane, ear canal and external ear normal. No tenderness.  No middle ear effusion. There is no impacted cerumen. Tympanic membrane is not perforated, erythematous, retracted or bulging.     Nose: Nose normal. No congestion.     Mouth/Throat:     Mouth: Mucous membranes are moist.     Pharynx: Uvula midline. No oropharyngeal exudate or posterior oropharyngeal erythema.     Tonsils: No tonsillar exudate.  Eyes:     Extraocular Movements: Extraocular movements intact.     Pupils:  Pupils are equal, round, and reactive to light.  Cardiovascular:     Rate and Rhythm: Normal rate and regular rhythm.     Heart sounds: Normal heart sounds.  Pulmonary:     Effort: Pulmonary effort is normal.     Breath sounds: Normal breath sounds. No decreased breath sounds, wheezing, rhonchi or rales.  Abdominal:     Palpations: Abdomen is soft.     Tenderness: There is no abdominal tenderness. There is no guarding or rebound.  Lymphadenopathy:     Cervical: No cervical adenopathy.     Right cervical: No superficial, deep or posterior cervical adenopathy.    Left cervical: No superficial, deep or posterior cervical adenopathy.  Skin:    Comments: No rash   Neurological:     General: No focal deficit present.     Mental Status: She is alert and oriented to person, place, and time.  Psychiatric:        Mood and Affect: Mood normal.        Behavior: Behavior normal.        Thought Content: Thought content normal.        Judgment: Judgment normal.      UC Treatments / Results  Labs (all labs ordered are listed, but only abnormal results are displayed) Labs Reviewed  POC SOFIA SARS ANTIGEN FIA - Normal  POCT INFLUENZA A/B - Normal    EKG   Radiology No results found.  Procedures Procedures (including critical care time)  Medications Ordered in UC Medications - No data to display  Initial Impression / Assessment and Plan / UC Course  I have reviewed the triage vital signs and the nursing notes.  Pertinent labs & imaging results that were available during my care of the patient were reviewed by me and considered in my medical decision making (see chart for details).     Patient is a pleasant 74 y.o. female presenting with acute cough. Pt w MAI (Mycobacterium Avium  Intracellulare) infection, followed by pulm. The patient is afebrile and nontachycardic.  Antipyretic has not been administered today.  -Covid negative -Influenza negative  Start z-pack as prescribed  by pulm. Diflucan  sent to have on hand.   F/u with Dr. Adrien Form / pulmonology on 12/28/24. Return precautions as below.  Final Clinical Impressions(s) / UC Diagnoses   Final diagnoses:  Mycobacterium avium complex [facility])     Discharge Instructions      -Your influenza and covid tests were negative -Start your azithromycin  antibiotic as prescribed by your pulmonologist  -If you develop a yeast infection, start the diflucan . Take pill #1. If symptoms persist in 3 days, take pill #2.  -Continue albuterol  as directed -Follow-up with pulmonologist Dr. Adrien Form on 12/28/24 at 1:30 pm -If your cough, fevers, etc worsen before you're able to see Dr. Form - follow-up with us  or PCP     ED Prescriptions     Medication Sig Dispense Auth. Provider   fluconazole  (DIFLUCAN ) 150 MG tablet Take 1 tablet (150 mg total) by mouth daily. -If you develop a yeast infection, start the diflucan . Take pill #1. If symptoms persist in 3 days, take pill #2. 2 tablet Ginelle Bays E, PA-C      PDMP not reviewed this encounter.     [1]  Social History Tobacco Use   Smoking status: Never    Passive exposure: Never   Smokeless tobacco: Never  Substance Use Topics   Alcohol use: No   Drug use: No     Arlyss Leita BRAVO, PA-C 12/22/24 1543  "

## 2024-12-27 NOTE — Progress Notes (Signed)
 "              Tami Estrada    [id number]    04/14/1951  Primary Care Physician:Hall, Norleen PEDLAR, MD Date of Appointment: 12/27/2024 Established Patient Visit  Chief complaint:   Chief Complaint  Patient presents with   Medical Management of Chronic Issues    Pt states a lot better now     HPI: Tami Estrada is a 74 y.o. 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 Chronic MAC infection, who was prior on triple therapy with ID in 2022, and she did not tolerate it. She has flare 2-3 times a year that require azithromycin , she is aware this can create resistance, but she will use it as need it, given the lack of options. She is on albuterol /HTS neb BID, flutter and chest vest daily. She has PFTs showing moderate restriction and moderate reduction of the DLCO.  Chronic chest wall pain related to her Nuss procedure on gabapentin  800 mg BID.   Interval Updates:  -Patient report to have fever and night sweats sporadically throughout the year. Needs azithromycin  2 times a year for flares.  -She had fever 2-3 days that it breaks with ibuprofen . She is off fever for the last 3 days. -She feels is hard to take a deep breath given the pain in her chest with deep inspiration. She is taking gabapentin  800 mg BID, which helps the pain. -She is using albuterol , saline neb BID, and chest vest and acapella bid. -Her sputum is intermittent and not significant, sometimes she does not have sputum.  -Continues to have poor appetite. -She has been doing pulm rehab, but she finds it very expensive, and she would like to work out in the regular gym. -She tells me that she feels uncomfortable with her breathing and it is painful. I mentioned options as palliative care to help to manage symptoms burden, but it seems she prefer to wait for now.  I have reviewed the patient's family social and past medical history and updated as appropriate.   Past Medical  History:  Diagnosis Date   Abnormal findings on diagnostic imaging of other specified body structures    Bitten or stung by nonvenomous insect and other nonvenomous arthropods, initial encounter    Chest pain, unspecified    Disappearance and death of family member    GERD (gastroesophageal reflux disease)    Esophogeal dysmotility- 2010   History of Rocky Mountain spotted fever    Hypothyroidism    Other fatigue    Pain in right shoulder    Thyroid  disease    Toxic effect of contact with other venomous plant, accidental (unintentional), initial encounter    Unspecified osteoarthritis, unspecified site     Past Surgical History:  Procedure Laterality Date   BACK SURGERY     lumbar disc   NECK SURGERY     cervical disc   SHOULDER ARTHROSCOPY WITH DISTAL CLAVICLE RESECTION Right 02/22/2015   Procedure: RIGHT SHOULDER ARTHROSCOPY WITH DISTAL CLAVICLE RESECTION, DEBRIDEMENT;  Surgeon: Taft FORBES Minerva, MD;  Location: [facility];  Service: Orthopedics;  Laterality: Right;   SHOULDER OPEN ROTATOR CUFF REPAIR Right 02/22/2015   Procedure: ROTATOR CUFF REPAIR SHOULDER OPEN;  Surgeon: Taft FORBES Minerva, MD;  Location: [facility];  Service: Orthopedics;  Laterality: Right;   THYROID  SURGERY     thyroidectomy    Family History  Problem Relation Age of Onset   Diabetes Sister  Cancer Sister    Pulmonary fibrosis Brother    Lung cancer Brother    COPD Daughter     Social History   Occupational History   Not on file  Tobacco Use   Smoking status: Never    Passive exposure: Never   Smokeless tobacco: Never  Substance and Sexual Activity   Alcohol use: No   Drug use: No   Sexual activity: Yes    Birth control/protection: Post-menopausal     Physical Exam: Blood pressure 122/69, pulse 79, height 5' 2 (1.575 m), weight 116 lb (52.6 kg), SpO2 98%.  Gen:      Thin, no distress, on room air Lungs:    pectus excavatum, ctab no wheeze, decrease breath sounds bilaterally. CV:       RRR  no mrg LE: no edema CNS: AOX3, alert and oriented.  Data Reviewed: Imaging: I have personally reviewed the ct chest August 2024 waxing/waning tree in bud opacities consistent with MAI infection, chronic unchanged bronchiectasis  PFTs:      Latest Ref Rng & Units 06/20/2024    8:09 AM 04/17/2020   11:43 AM  PFT Results  FVC-Pre L 1.80  2.51  P  FVC-Predicted Pre % 69  93  P  FVC-Post L 1.58  2.66  P  FVC-Predicted Post % 60  98  P  Pre FEV1/FVC % % 75  75  P  Post FEV1/FCV % % 69  77  P  FEV1-Pre L 1.35  1.89  P  FEV1-Predicted Pre % 69  92  P  FEV1-Post L 1.09  2.04  P  DLCO uncorrected ml/min/mmHg 9.82  18.44  P  DLCO UNC% % 55  103  P  DLCO corrected ml/min/mmHg  33.62  P  DLCO COR %Predicted %  188  P  DLVA Predicted % 101  223  P  TLC L 3.39  4.87  P  TLC % Predicted % 71  104  P  RV % Predicted % 77  112  P    P Preliminary result   I have personally reviewed the patient's PFTs and they demonstrate restriction to ventilation  Labs: Lab Results  Component Value Date   NA 136 03/29/2023   K 3.6 03/29/2023   CO2 24 03/29/2023   GLUCOSE 99 03/29/2023   BUN 11 03/29/2023   CREATININE 0.75 03/29/2023   CALCIUM 9.4 03/29/2023   EGFR 73.0 09/19/2024   GFRNONAA >60 03/29/2023   Lab Results  Component Value Date   WBC 9.4 03/29/2023   HGB 14.0 03/29/2023   HCT 41.4 03/29/2023   MCV 92.0 03/29/2023   PLT 258 03/29/2023     Immunization status: Immunization History  Administered Date(s) Administered   Fluad Quad(high Dose 65+) 09/11/2020, 09/10/2021   Fluad Trivalent(High Dose 65+) 10/01/2024   INFLUENZA, HIGH DOSE SEASONAL PF 10/18/2018   Influenza Inj Mdck Quad Pf 10/08/2016   Influenza Split 09/13/2012   Influenza,inj,Quad PF,6+ Mos 09/14/2019   Influenza-Unspecified 11/24/2013, 09/03/2014, 10/16/2015, 10/07/2022   Moderna Sars-Covid-2 Vaccination 02/01/2020, 03/01/2020   Pneumococcal Conjugate-13 08/10/2018   Pneumococcal Polysaccharide-23 10/16/2015     CT chest 03/2023 1. Pulmonary parenchymal findings compatible with slightly worsened chronic airway infection/inflammation possibly due to atypical mycobacterial infection. 2. Increased size of the focal nodular consolidation along the right major fissure now measuring 16 x 12 mm. This is likely related to worsening infection however follow-up in 3-4 months is recommended to ensure resolution.  PFT    06/20/24  FVC    1.8/-2/69% FEV1  1.3/-1.8/69% F/F      75 TLC     3.3/-2.5/71% DLCO  9.8/-3.3/55%     Assessment:  Chronic Restrictive Lung Disease Pectus Excavatum s/p Nuss procedure July 2021 Chronic progressive MAC infection, failed triple therapy in 2022.  Plan/Recommendations: We discussed her MAI infection at length. She has failed a trial of triple abx therapy due to adverse effects in 2022. She is not interested in pursuing at this time, despite her worsening pulmonary symptoms.   Breathing testing shows worsening lung function with moderate restriction, likely due to worsening lung disease but also influenced by the pain. She is attending pulmonary rehab however due to financial cost, she prefers to go to a regular gym.  Continue the airway clearance with albuterol  nebs and saline nebs 2-3 times daily with flutter valve and vest. She has intermittent fever and night sweats.  We discussed that given her treatment goals, we will accept the risk for developing anti-microbial resistance since she is unlikely to ever desire full eradication treatment. (Should require prn antibiotics with azithromycin .)  Family wants to see the progression of her condition. I will repeat CT chest to evaluate as well a pulmonary function test. I explained if symptoms are progressing other considerations are palliative care to help with symptoms burden, or even in the last stage of extensive infection, she can consider hospice, however she is not at this stage currently.  On gabapentin  to 800 mg TID for  neuropathic pain related to pectus excavatum and post-operative symptoms.  Return to Care: Return in about 3 months (around 03/28/2025). To discuss PFT and CT scan.    Marny Patch, MD Pulmonary and Critical Care Medicine [facility] Office:[phone]    "

## 2024-12-28 ENCOUNTER — Other Ambulatory Visit: Payer: Self-pay

## 2024-12-28 ENCOUNTER — Ambulatory Visit

## 2024-12-28 VITALS — BP 122/69 | HR 79 | Ht 62.0 in | Wt 116.0 lb

## 2024-12-28 DIAGNOSIS — Z9889 Other specified postprocedural states: Secondary | ICD-10-CM

## 2024-12-28 DIAGNOSIS — A310 Pulmonary mycobacterial infection: Secondary | ICD-10-CM | POA: Diagnosis not present

## 2024-12-28 DIAGNOSIS — J984 Other disorders of lung: Secondary | ICD-10-CM | POA: Diagnosis not present

## 2024-12-28 DIAGNOSIS — Q676 Pectus excavatum: Secondary | ICD-10-CM

## 2024-12-28 DIAGNOSIS — Z8619 Personal history of other infectious and parasitic diseases: Secondary | ICD-10-CM

## 2024-12-28 MED ORDER — SODIUM CHLORIDE 3 % IN NEBU: INHALATION_SOLUTION | Freq: Three times a day (TID) | RESPIRATORY_TRACT | 10 refills | Status: AC | PRN

## 2024-12-28 MED ORDER — ALBUTEROL SULFATE (2.5 MG/3ML) 0.083% IN NEBU: 2.5000 mg | INHALATION_SOLUTION | Freq: Four times a day (QID) | RESPIRATORY_TRACT | 10 refills | Status: AC | PRN

## 2024-12-28 MED ORDER — ALBUTEROL SULFATE HFA 108 (90 BASE) MCG/ACT IN AERS: 1.0000 | INHALATION_SPRAY | Freq: Four times a day (QID) | RESPIRATORY_TRACT | 10 refills | Status: AC | PRN

## 2024-12-28 NOTE — Patient Instructions (Signed)
 Dear Mr. Goslin  I will repeat a CT scan in a month. I will repeat a pulmonary function test in 3 months.  Continue exercising and walking every day. It is very important for you. Continue using the hypertonic saline and albuterol  twice a day for secretions.  Let me know if you have a flare, to send you a prescription of the antibiotic - azithromycin .  I will see you in 3 months

## 2025-02-02 ENCOUNTER — Encounter

## 2025-04-11 ENCOUNTER — Encounter

## 2025-04-11 ENCOUNTER — Ambulatory Visit
